# Patient Record
Sex: Male | Born: 1940 | Race: White | Hispanic: No | Marital: Married | Smoking: Former smoker
Health system: Southern US, Community
[De-identification: ages and names within clinical notes are randomized; demographics above are authoritative.]

## PROBLEM LIST (undated history)

## (undated) DIAGNOSIS — I779 Disorder of arteries and arterioles, unspecified: Secondary | ICD-10-CM

## (undated) DIAGNOSIS — R001 Bradycardia, unspecified: Secondary | ICD-10-CM

## (undated) DIAGNOSIS — R059 Cough, unspecified: Secondary | ICD-10-CM

## (undated) DIAGNOSIS — I2510 Atherosclerotic heart disease of native coronary artery without angina pectoris: Secondary | ICD-10-CM

## (undated) DIAGNOSIS — E7800 Pure hypercholesterolemia, unspecified: Secondary | ICD-10-CM

## (undated) DIAGNOSIS — R251 Tremor, unspecified: Secondary | ICD-10-CM

## (undated) DIAGNOSIS — I739 Peripheral vascular disease, unspecified: Secondary | ICD-10-CM

## (undated) DIAGNOSIS — Z86018 Personal history of other benign neoplasm: Secondary | ICD-10-CM

## (undated) DIAGNOSIS — D649 Anemia, unspecified: Secondary | ICD-10-CM

## (undated) DIAGNOSIS — K219 Gastro-esophageal reflux disease without esophagitis: Secondary | ICD-10-CM

## (undated) DIAGNOSIS — I471 Supraventricular tachycardia, unspecified: Secondary | ICD-10-CM

## (undated) DIAGNOSIS — G4733 Obstructive sleep apnea (adult) (pediatric): Secondary | ICD-10-CM

## (undated) DIAGNOSIS — I340 Nonrheumatic mitral (valve) insufficiency: Secondary | ICD-10-CM

## (undated) DIAGNOSIS — R55 Syncope and collapse: Secondary | ICD-10-CM

## (undated) DIAGNOSIS — D66 Hereditary factor VIII deficiency: Secondary | ICD-10-CM

## (undated) DIAGNOSIS — R9430 Abnormal result of cardiovascular function study, unspecified: Secondary | ICD-10-CM

## (undated) DIAGNOSIS — R05 Cough: Secondary | ICD-10-CM

## (undated) DIAGNOSIS — IMO0002 Reserved for concepts with insufficient information to code with codable children: Secondary | ICD-10-CM

## (undated) DIAGNOSIS — I10 Essential (primary) hypertension: Secondary | ICD-10-CM

## (undated) DIAGNOSIS — Z7901 Long term (current) use of anticoagulants: Secondary | ICD-10-CM

## (undated) DIAGNOSIS — K5090 Crohn's disease, unspecified, without complications: Secondary | ICD-10-CM

## (undated) DIAGNOSIS — R269 Unspecified abnormalities of gait and mobility: Secondary | ICD-10-CM

## (undated) DIAGNOSIS — Z79899 Other long term (current) drug therapy: Secondary | ICD-10-CM

## (undated) DIAGNOSIS — I951 Orthostatic hypotension: Secondary | ICD-10-CM

## (undated) DIAGNOSIS — F419 Anxiety disorder, unspecified: Secondary | ICD-10-CM

## (undated) DIAGNOSIS — I4510 Unspecified right bundle-branch block: Secondary | ICD-10-CM

## (undated) DIAGNOSIS — J986 Disorders of diaphragm: Secondary | ICD-10-CM

## (undated) DIAGNOSIS — I2699 Other pulmonary embolism without acute cor pulmonale: Secondary | ICD-10-CM

## (undated) DIAGNOSIS — I480 Paroxysmal atrial fibrillation: Secondary | ICD-10-CM

## (undated) HISTORY — DX: Supraventricular tachycardia, unspecified: I47.10

## (undated) HISTORY — DX: Essential (primary) hypertension: I10

## (undated) HISTORY — DX: Hereditary factor VIII deficiency: D66

## (undated) HISTORY — PX: CHOLECYSTECTOMY: SHX55

## (undated) HISTORY — PX: BACK SURGERY: SHX140

## (undated) HISTORY — DX: Disorders of diaphragm: J98.6

## (undated) HISTORY — DX: Cough: R05

## (undated) HISTORY — DX: Unspecified right bundle-branch block: I45.10

## (undated) HISTORY — DX: Atherosclerotic heart disease of native coronary artery without angina pectoris: I25.10

## (undated) HISTORY — PX: CARDIAC CATHETERIZATION: SHX172

## (undated) HISTORY — DX: Anemia, unspecified: D64.9

## (undated) HISTORY — DX: Crohn's disease, unspecified, without complications: K50.90

## (undated) HISTORY — DX: Nonrheumatic mitral (valve) insufficiency: I34.0

## (undated) HISTORY — PX: COLON SURGERY: SHX602

## (undated) HISTORY — DX: Other pulmonary embolism without acute cor pulmonale: I26.99

## (undated) HISTORY — PX: COLONOSCOPY W/ POLYPECTOMY: SHX1380

## (undated) HISTORY — DX: Long term (current) use of anticoagulants: Z79.01

## (undated) HISTORY — DX: Anxiety disorder, unspecified: F41.9

## (undated) HISTORY — DX: Syncope and collapse: R55

## (undated) HISTORY — DX: Tremor, unspecified: R25.1

## (undated) HISTORY — DX: Orthostatic hypotension: I95.1

## (undated) HISTORY — DX: Bradycardia, unspecified: R00.1

## (undated) HISTORY — DX: Personal history of other benign neoplasm: Z86.018

## (undated) HISTORY — PX: LUMBAR DISC SURGERY: SHX700

## (undated) HISTORY — DX: Peripheral vascular disease, unspecified: I73.9

## (undated) HISTORY — DX: Supraventricular tachycardia: I47.1

## (undated) HISTORY — DX: Abnormal result of cardiovascular function study, unspecified: R94.30

## (undated) HISTORY — DX: Reserved for concepts with insufficient information to code with codable children: IMO0002

## (undated) HISTORY — DX: Other long term (current) drug therapy: Z79.899

## (undated) HISTORY — DX: Disorder of arteries and arterioles, unspecified: I77.9

## (undated) HISTORY — DX: Unspecified abnormalities of gait and mobility: R26.9

## (undated) HISTORY — DX: Cough, unspecified: R05.9

## (undated) HISTORY — PX: TONSILLECTOMY: SUR1361

---

## 1989-04-12 HISTORY — PX: CATARACT EXTRACTION W/ INTRAOCULAR LENS  IMPLANT, BILATERAL: SHX1307

## 2004-07-03 ENCOUNTER — Ambulatory Visit: Payer: Self-pay

## 2004-07-10 ENCOUNTER — Ambulatory Visit: Payer: Self-pay

## 2005-03-20 ENCOUNTER — Ambulatory Visit: Payer: Self-pay | Admitting: Unknown Physician Specialty

## 2005-03-31 ENCOUNTER — Inpatient Hospital Stay: Payer: Self-pay | Admitting: Unknown Physician Specialty

## 2005-03-31 ENCOUNTER — Other Ambulatory Visit: Payer: Self-pay

## 2005-04-21 ENCOUNTER — Other Ambulatory Visit: Payer: Self-pay

## 2005-04-21 ENCOUNTER — Inpatient Hospital Stay: Payer: Self-pay | Admitting: Internal Medicine

## 2005-06-07 ENCOUNTER — Other Ambulatory Visit: Payer: Self-pay

## 2005-06-07 ENCOUNTER — Inpatient Hospital Stay: Payer: Self-pay | Admitting: Unknown Physician Specialty

## 2005-06-08 ENCOUNTER — Other Ambulatory Visit: Payer: Self-pay

## 2005-07-11 ENCOUNTER — Ambulatory Visit: Payer: Self-pay | Admitting: Cardiology

## 2005-07-19 ENCOUNTER — Ambulatory Visit: Payer: Self-pay

## 2005-07-29 ENCOUNTER — Ambulatory Visit: Payer: Self-pay | Admitting: Cardiology

## 2005-08-12 HISTORY — PX: VENA CAVA FILTER PLACEMENT: SUR1032

## 2005-10-22 ENCOUNTER — Ambulatory Visit: Payer: Self-pay | Admitting: Cardiology

## 2005-11-05 ENCOUNTER — Ambulatory Visit: Payer: Self-pay | Admitting: Unknown Physician Specialty

## 2005-11-14 ENCOUNTER — Inpatient Hospital Stay: Payer: Self-pay | Admitting: Internal Medicine

## 2005-12-04 ENCOUNTER — Ambulatory Visit: Payer: Self-pay | Admitting: Cardiology

## 2005-12-09 ENCOUNTER — Ambulatory Visit: Payer: Self-pay | Admitting: Internal Medicine

## 2006-01-06 ENCOUNTER — Ambulatory Visit: Payer: Self-pay | Admitting: Internal Medicine

## 2006-01-10 ENCOUNTER — Ambulatory Visit: Payer: Self-pay | Admitting: Internal Medicine

## 2006-02-25 ENCOUNTER — Ambulatory Visit: Payer: Self-pay | Admitting: Ophthalmology

## 2006-03-03 ENCOUNTER — Ambulatory Visit: Payer: Self-pay | Admitting: Ophthalmology

## 2006-03-07 ENCOUNTER — Ambulatory Visit: Payer: Self-pay | Admitting: Internal Medicine

## 2006-05-12 ENCOUNTER — Ambulatory Visit: Payer: Self-pay | Admitting: Cardiology

## 2006-06-09 ENCOUNTER — Ambulatory Visit: Payer: Self-pay | Admitting: Unknown Physician Specialty

## 2006-09-22 ENCOUNTER — Ambulatory Visit: Payer: Self-pay | Admitting: Cardiology

## 2006-10-20 ENCOUNTER — Ambulatory Visit: Payer: Self-pay | Admitting: Cardiology

## 2006-10-20 LAB — CONVERTED CEMR LAB: TSH: 1.61 microintl units/mL (ref 0.35–5.50)

## 2006-11-11 HISTORY — PX: CORONARY ARTERY BYPASS GRAFT: SHX141

## 2006-11-12 ENCOUNTER — Ambulatory Visit: Payer: Self-pay | Admitting: Psychiatry

## 2006-11-13 ENCOUNTER — Ambulatory Visit: Payer: Self-pay | Admitting: Cardiology

## 2006-11-13 ENCOUNTER — Ambulatory Visit: Payer: Self-pay | Admitting: Psychiatry

## 2006-11-13 LAB — CONVERTED CEMR LAB
ALT: 26 units/L (ref 0–40)
AST: 27 units/L (ref 0–37)
Albumin: 3.9 g/dL (ref 3.5–5.2)
Alkaline Phosphatase: 68 units/L (ref 39–117)
Basophils Absolute: 0.1 10*3/uL (ref 0.0–0.1)
Calcium: 9.3 mg/dL (ref 8.4–10.5)
Chloride: 106 meq/L (ref 96–112)
Creatinine, Ser: 1 mg/dL (ref 0.4–1.5)
GFR calc non Af Amer: 79 mL/min
HCT: 41.5 % (ref 39.0–52.0)
MCHC: 33.3 g/dL (ref 30.0–36.0)
Neutrophils Relative %: 48 % (ref 43.0–77.0)
Platelets: 306 10*3/uL (ref 150–400)
RBC: 4.92 M/uL (ref 4.22–5.81)
RDW: 17.2 % — ABNORMAL HIGH (ref 11.5–14.6)
Sodium: 142 meq/L (ref 135–145)
Total Bilirubin: 0.6 mg/dL (ref 0.3–1.2)
WBC: 10.9 10*3/uL — ABNORMAL HIGH (ref 4.5–10.5)

## 2006-11-19 ENCOUNTER — Ambulatory Visit: Payer: Self-pay | Admitting: Cardiology

## 2006-11-24 ENCOUNTER — Ambulatory Visit: Payer: Self-pay | Admitting: Cardiology

## 2006-11-24 ENCOUNTER — Ambulatory Visit (HOSPITAL_COMMUNITY): Admission: RE | Admit: 2006-11-24 | Discharge: 2006-11-24 | Payer: Self-pay | Admitting: Cardiology

## 2006-11-25 ENCOUNTER — Ambulatory Visit: Payer: Self-pay | Admitting: Cardiology

## 2006-11-25 ENCOUNTER — Encounter: Payer: Self-pay | Admitting: Cardiovascular Disease

## 2006-11-25 ENCOUNTER — Ambulatory Visit: Payer: Self-pay

## 2006-11-25 LAB — CONVERTED CEMR LAB
BUN: 13 mg/dL (ref 6–23)
CO2: 29 meq/L (ref 19–32)
Creatinine, Ser: 1.1 mg/dL (ref 0.4–1.5)
GFR calc Af Amer: 86 mL/min
Glucose, Bld: 90 mg/dL (ref 70–99)
Potassium: 4.3 meq/L (ref 3.5–5.1)
Sodium: 141 meq/L (ref 135–145)

## 2006-11-26 ENCOUNTER — Ambulatory Visit: Payer: Self-pay | Admitting: Cardiology

## 2006-11-26 LAB — CONVERTED CEMR LAB
BUN: 12 mg/dL (ref 6–23)
Basophils Relative: 0.3 % (ref 0.0–1.0)
CO2: 28 meq/L (ref 19–32)
Calcium: 9.9 mg/dL (ref 8.4–10.5)
Eosinophils Absolute: 0 10*3/uL (ref 0.0–0.6)
GFR calc Af Amer: 96 mL/min
GFR calc non Af Amer: 79 mL/min
Hemoglobin: 14.7 g/dL (ref 13.0–17.0)
Lymphocytes Relative: 19.7 % (ref 12.0–46.0)
MCHC: 33.2 g/dL (ref 30.0–36.0)
MCV: 83.9 fL (ref 78.0–100.0)
Monocytes Absolute: 0.1 10*3/uL — ABNORMAL LOW (ref 0.2–0.7)
Monocytes Relative: 1 % — ABNORMAL LOW (ref 3.0–11.0)
Neutro Abs: 5.1 10*3/uL (ref 1.4–7.7)
Platelets: 301 10*3/uL (ref 150–400)
Potassium: 4.4 meq/L (ref 3.5–5.1)

## 2006-12-01 ENCOUNTER — Inpatient Hospital Stay (HOSPITAL_BASED_OUTPATIENT_CLINIC_OR_DEPARTMENT_OTHER): Admission: RE | Admit: 2006-12-01 | Discharge: 2006-12-01 | Payer: Self-pay | Admitting: Cardiovascular Disease

## 2006-12-01 ENCOUNTER — Ambulatory Visit: Payer: Self-pay | Admitting: Cardiology

## 2006-12-01 ENCOUNTER — Ambulatory Visit: Payer: Self-pay | Admitting: Cardiovascular Disease

## 2006-12-03 ENCOUNTER — Ambulatory Visit: Payer: Self-pay | Admitting: Thoracic Surgery (Cardiothoracic Vascular Surgery)

## 2006-12-05 ENCOUNTER — Ambulatory Visit: Payer: Self-pay | Admitting: Cardiology

## 2006-12-10 ENCOUNTER — Ambulatory Visit: Payer: Self-pay | Admitting: Internal Medicine

## 2006-12-10 ENCOUNTER — Ambulatory Visit: Payer: Self-pay | Admitting: Thoracic Surgery (Cardiothoracic Vascular Surgery)

## 2006-12-10 ENCOUNTER — Inpatient Hospital Stay (HOSPITAL_COMMUNITY)
Admission: RE | Admit: 2006-12-10 | Discharge: 2006-12-17 | Payer: Self-pay | Admitting: Thoracic Surgery (Cardiothoracic Vascular Surgery)

## 2006-12-24 ENCOUNTER — Ambulatory Visit: Payer: Self-pay | Admitting: Cardiology

## 2006-12-24 ENCOUNTER — Ambulatory Visit: Payer: Self-pay | Admitting: *Deleted

## 2006-12-24 LAB — CONVERTED CEMR LAB
INR: 3.2 — ABNORMAL HIGH (ref 0.9–2.0)
Prothrombin Time: 22.6 s — ABNORMAL HIGH (ref 10.0–14.0)

## 2007-01-01 ENCOUNTER — Ambulatory Visit: Payer: Self-pay | Admitting: Cardiology

## 2007-01-09 ENCOUNTER — Ambulatory Visit: Payer: Self-pay | Admitting: Cardiology

## 2007-01-12 ENCOUNTER — Ambulatory Visit: Payer: Self-pay | Admitting: Thoracic Surgery (Cardiothoracic Vascular Surgery)

## 2007-01-23 ENCOUNTER — Ambulatory Visit: Payer: Self-pay | Admitting: Cardiovascular Disease

## 2007-01-28 ENCOUNTER — Ambulatory Visit: Payer: Self-pay | Admitting: Cardiology

## 2007-02-11 ENCOUNTER — Ambulatory Visit: Payer: Self-pay | Admitting: Cardiology

## 2007-02-11 ENCOUNTER — Encounter: Payer: Self-pay | Admitting: Cardiology

## 2007-02-25 ENCOUNTER — Ambulatory Visit: Payer: Self-pay | Admitting: Internal Medicine

## 2007-03-13 ENCOUNTER — Encounter: Payer: Self-pay | Admitting: Cardiology

## 2007-03-13 ENCOUNTER — Ambulatory Visit: Payer: Self-pay | Admitting: Internal Medicine

## 2007-04-02 ENCOUNTER — Ambulatory Visit: Payer: Self-pay | Admitting: Cardiology

## 2007-04-13 ENCOUNTER — Encounter: Payer: Self-pay | Admitting: Cardiology

## 2007-04-16 ENCOUNTER — Ambulatory Visit: Payer: Self-pay | Admitting: Cardiology

## 2007-04-28 ENCOUNTER — Ambulatory Visit: Payer: Self-pay | Admitting: Unknown Physician Specialty

## 2007-05-01 ENCOUNTER — Ambulatory Visit: Payer: Self-pay | Admitting: Cardiovascular Disease

## 2007-05-01 ENCOUNTER — Ambulatory Visit: Payer: Self-pay | Admitting: Cardiology

## 2007-05-08 ENCOUNTER — Ambulatory Visit: Payer: Self-pay | Admitting: Cardiovascular Disease

## 2007-05-13 ENCOUNTER — Encounter: Payer: Self-pay | Admitting: Cardiology

## 2007-05-29 ENCOUNTER — Ambulatory Visit: Payer: Self-pay | Admitting: Cardiology

## 2007-06-26 ENCOUNTER — Ambulatory Visit: Payer: Self-pay | Admitting: Cardiology

## 2007-07-17 ENCOUNTER — Ambulatory Visit: Payer: Self-pay | Admitting: Cardiology

## 2007-08-03 ENCOUNTER — Ambulatory Visit: Payer: Self-pay | Admitting: Cardiology

## 2007-08-28 ENCOUNTER — Ambulatory Visit: Payer: Self-pay | Admitting: Cardiology

## 2007-09-23 ENCOUNTER — Ambulatory Visit: Payer: Self-pay | Admitting: Cardiology

## 2007-10-06 ENCOUNTER — Ambulatory Visit: Payer: Self-pay | Admitting: Cardiology

## 2007-10-29 ENCOUNTER — Ambulatory Visit: Payer: Self-pay | Admitting: Cardiology

## 2007-11-13 ENCOUNTER — Ambulatory Visit: Payer: Self-pay | Admitting: Cardiology

## 2007-11-13 ENCOUNTER — Encounter: Payer: Self-pay | Admitting: Cardiology

## 2007-11-13 ENCOUNTER — Ambulatory Visit: Payer: Self-pay

## 2007-11-19 ENCOUNTER — Ambulatory Visit: Payer: Self-pay | Admitting: Cardiology

## 2007-11-19 ENCOUNTER — Ambulatory Visit: Payer: Self-pay

## 2007-11-26 ENCOUNTER — Ambulatory Visit: Payer: Self-pay

## 2007-12-04 ENCOUNTER — Ambulatory Visit: Payer: Self-pay | Admitting: Internal Medicine

## 2007-12-08 ENCOUNTER — Ambulatory Visit: Payer: Self-pay | Admitting: Cardiology

## 2007-12-09 ENCOUNTER — Ambulatory Visit: Payer: Self-pay | Admitting: Internal Medicine

## 2007-12-14 ENCOUNTER — Ambulatory Visit: Payer: Self-pay | Admitting: Internal Medicine

## 2007-12-25 ENCOUNTER — Ambulatory Visit: Payer: Self-pay | Admitting: Cardiology

## 2007-12-30 ENCOUNTER — Ambulatory Visit: Payer: Self-pay | Admitting: Cardiology

## 2008-01-14 ENCOUNTER — Ambulatory Visit: Payer: Self-pay | Admitting: Internal Medicine

## 2008-01-29 ENCOUNTER — Ambulatory Visit: Payer: Self-pay | Admitting: Internal Medicine

## 2008-02-08 ENCOUNTER — Ambulatory Visit: Payer: Self-pay | Admitting: Cardiology

## 2008-02-22 ENCOUNTER — Ambulatory Visit: Payer: Self-pay | Admitting: Cardiology

## 2008-03-14 ENCOUNTER — Ambulatory Visit: Payer: Self-pay | Admitting: Cardiology

## 2008-04-04 ENCOUNTER — Ambulatory Visit: Payer: Self-pay | Admitting: Internal Medicine

## 2008-04-11 ENCOUNTER — Ambulatory Visit: Payer: Self-pay | Admitting: Cardiology

## 2008-04-25 ENCOUNTER — Ambulatory Visit: Payer: Self-pay | Admitting: Cardiology

## 2008-05-04 ENCOUNTER — Ambulatory Visit: Payer: Self-pay | Admitting: Cardiology

## 2008-05-12 ENCOUNTER — Ambulatory Visit: Payer: Self-pay | Admitting: Internal Medicine

## 2008-06-03 ENCOUNTER — Ambulatory Visit: Payer: Self-pay | Admitting: Cardiology

## 2008-06-16 ENCOUNTER — Ambulatory Visit: Payer: Self-pay | Admitting: Cardiology

## 2008-07-01 ENCOUNTER — Ambulatory Visit: Payer: Self-pay | Admitting: Cardiovascular Disease

## 2008-07-22 ENCOUNTER — Ambulatory Visit: Payer: Self-pay | Admitting: Cardiovascular Disease

## 2008-07-27 ENCOUNTER — Ambulatory Visit: Payer: Self-pay | Admitting: Cardiology

## 2008-08-09 ENCOUNTER — Ambulatory Visit: Payer: Self-pay | Admitting: Cardiovascular Disease

## 2008-09-06 ENCOUNTER — Ambulatory Visit: Payer: Self-pay | Admitting: Cardiology

## 2008-09-30 ENCOUNTER — Ambulatory Visit: Payer: Self-pay | Admitting: Cardiovascular Disease

## 2008-10-21 ENCOUNTER — Ambulatory Visit: Payer: Self-pay | Admitting: Cardiovascular Disease

## 2008-11-03 ENCOUNTER — Encounter: Payer: Self-pay | Admitting: Cardiology

## 2008-11-03 ENCOUNTER — Ambulatory Visit: Payer: Self-pay | Admitting: Cardiology

## 2008-11-10 ENCOUNTER — Ambulatory Visit: Payer: Self-pay | Admitting: Cardiovascular Disease

## 2008-12-02 ENCOUNTER — Ambulatory Visit: Payer: Self-pay | Admitting: Cardiology

## 2008-12-23 ENCOUNTER — Ambulatory Visit: Payer: Self-pay | Admitting: Cardiology

## 2009-01-09 ENCOUNTER — Ambulatory Visit: Payer: Self-pay | Admitting: Cardiovascular Disease

## 2009-01-09 ENCOUNTER — Observation Stay: Payer: Self-pay | Admitting: Internal Medicine

## 2009-01-10 ENCOUNTER — Telehealth: Payer: Self-pay | Admitting: Cardiology

## 2009-01-10 ENCOUNTER — Encounter: Payer: Self-pay | Admitting: Cardiovascular Disease

## 2009-01-10 ENCOUNTER — Encounter: Payer: Self-pay | Admitting: *Deleted

## 2009-01-10 ENCOUNTER — Encounter: Payer: Self-pay | Admitting: Cardiology

## 2009-01-13 ENCOUNTER — Ambulatory Visit: Payer: Self-pay | Admitting: Cardiology

## 2009-01-19 ENCOUNTER — Encounter: Payer: Self-pay | Admitting: Cardiology

## 2009-01-25 ENCOUNTER — Encounter: Payer: Self-pay | Admitting: Cardiology

## 2009-01-25 DIAGNOSIS — K5090 Crohn's disease, unspecified, without complications: Secondary | ICD-10-CM

## 2009-01-27 ENCOUNTER — Ambulatory Visit: Payer: Self-pay | Admitting: Cardiology

## 2009-02-06 ENCOUNTER — Ambulatory Visit: Payer: Self-pay | Admitting: Cardiology

## 2009-02-06 LAB — CONVERTED CEMR LAB
POC INR: 2.7
Prothrombin Time: 19.9 s

## 2009-02-08 ENCOUNTER — Encounter (INDEPENDENT_AMBULATORY_CARE_PROVIDER_SITE_OTHER): Payer: Self-pay | Admitting: *Deleted

## 2009-02-08 ENCOUNTER — Ambulatory Visit: Payer: Self-pay | Admitting: Cardiology

## 2009-02-08 LAB — CONVERTED CEMR LAB
Basophils Relative: 2.9 % (ref 0.0–3.0)
CO2: 30 meq/L (ref 19–32)
Chloride: 103 meq/L (ref 96–112)
Creatinine, Ser: 0.9 mg/dL (ref 0.4–1.5)
Eosinophils Absolute: 0.4 10*3/uL (ref 0.0–0.7)
Eosinophils Relative: 4.7 % (ref 0.0–5.0)
HCT: 42.8 % (ref 39.0–52.0)
Lymphs Abs: 3.7 10*3/uL (ref 0.7–4.0)
MCHC: 34.1 g/dL (ref 30.0–36.0)
MCV: 92 fL (ref 78.0–100.0)
Monocytes Absolute: 1.1 10*3/uL — ABNORMAL HIGH (ref 0.1–1.0)
Neutro Abs: 2.9 10*3/uL (ref 1.4–7.7)
Potassium: 4.3 meq/L (ref 3.5–5.1)
RBC: 4.65 M/uL (ref 4.22–5.81)
WBC: 8.3 10*3/uL (ref 4.5–10.5)

## 2009-02-09 ENCOUNTER — Ambulatory Visit: Payer: Self-pay | Admitting: Cardiovascular Disease

## 2009-02-09 ENCOUNTER — Inpatient Hospital Stay (HOSPITAL_BASED_OUTPATIENT_CLINIC_OR_DEPARTMENT_OTHER): Admission: RE | Admit: 2009-02-09 | Discharge: 2009-02-09 | Payer: Self-pay | Admitting: Cardiovascular Disease

## 2009-02-15 ENCOUNTER — Encounter: Payer: Self-pay | Admitting: *Deleted

## 2009-02-17 ENCOUNTER — Ambulatory Visit: Payer: Self-pay | Admitting: Cardiology

## 2009-02-17 LAB — CONVERTED CEMR LAB: Prothrombin Time: 16.1 s

## 2009-02-22 ENCOUNTER — Ambulatory Visit: Payer: Self-pay | Admitting: Internal Medicine

## 2009-03-06 ENCOUNTER — Ambulatory Visit: Payer: Self-pay | Admitting: Cardiology

## 2009-03-06 LAB — CONVERTED CEMR LAB
POC INR: 2.3
Prothrombin Time: 18.4 s

## 2009-04-03 ENCOUNTER — Ambulatory Visit: Payer: Self-pay | Admitting: Cardiovascular Disease

## 2009-04-03 LAB — CONVERTED CEMR LAB: POC INR: 2.1

## 2009-04-11 ENCOUNTER — Encounter: Payer: Self-pay | Admitting: Cardiology

## 2009-04-12 ENCOUNTER — Ambulatory Visit: Payer: Self-pay | Admitting: Cardiology

## 2009-04-27 ENCOUNTER — Encounter: Payer: Self-pay | Admitting: Cardiology

## 2009-05-05 ENCOUNTER — Ambulatory Visit: Payer: Self-pay | Admitting: Internal Medicine

## 2009-05-05 LAB — CONVERTED CEMR LAB: POC INR: 1.9

## 2009-06-02 ENCOUNTER — Ambulatory Visit: Payer: Self-pay | Admitting: Cardiology

## 2009-06-02 LAB — CONVERTED CEMR LAB: POC INR: 2.1

## 2009-06-30 ENCOUNTER — Ambulatory Visit: Payer: Self-pay | Admitting: Cardiology

## 2009-07-28 ENCOUNTER — Ambulatory Visit: Payer: Self-pay | Admitting: Cardiovascular Disease

## 2009-08-25 ENCOUNTER — Ambulatory Visit: Payer: Self-pay | Admitting: Internal Medicine

## 2009-08-25 LAB — CONVERTED CEMR LAB: POC INR: 2

## 2009-09-08 ENCOUNTER — Encounter (INDEPENDENT_AMBULATORY_CARE_PROVIDER_SITE_OTHER): Payer: Self-pay | Admitting: *Deleted

## 2009-09-22 ENCOUNTER — Ambulatory Visit: Payer: Self-pay | Admitting: Internal Medicine

## 2009-09-22 ENCOUNTER — Encounter (INDEPENDENT_AMBULATORY_CARE_PROVIDER_SITE_OTHER): Payer: Self-pay | Admitting: Cardiology

## 2009-10-24 ENCOUNTER — Ambulatory Visit: Payer: Self-pay | Admitting: Cardiology

## 2009-10-24 ENCOUNTER — Encounter: Payer: Self-pay | Admitting: Cardiology

## 2009-10-26 ENCOUNTER — Encounter: Payer: Self-pay | Admitting: Cardiology

## 2009-11-10 ENCOUNTER — Ambulatory Visit: Payer: Self-pay | Admitting: Internal Medicine

## 2009-11-10 LAB — CONVERTED CEMR LAB: POC INR: 2.2

## 2009-12-04 ENCOUNTER — Ambulatory Visit: Payer: Self-pay | Admitting: Cardiovascular Disease

## 2009-12-04 LAB — CONVERTED CEMR LAB: POC INR: 2

## 2010-01-05 ENCOUNTER — Ambulatory Visit: Payer: Self-pay | Admitting: Cardiology

## 2010-01-05 LAB — CONVERTED CEMR LAB: POC INR: 2.2

## 2010-02-02 ENCOUNTER — Ambulatory Visit: Payer: Self-pay | Admitting: Cardiology

## 2010-02-02 LAB — CONVERTED CEMR LAB: POC INR: 2.3

## 2010-03-02 ENCOUNTER — Ambulatory Visit: Payer: Self-pay | Admitting: Cardiology

## 2010-03-02 LAB — CONVERTED CEMR LAB: POC INR: 2.2

## 2010-03-16 ENCOUNTER — Ambulatory Visit: Payer: Self-pay | Admitting: Cardiovascular Disease

## 2010-03-20 ENCOUNTER — Ambulatory Visit: Payer: Self-pay | Admitting: Unknown Physician Specialty

## 2010-03-23 ENCOUNTER — Ambulatory Visit: Payer: Self-pay | Admitting: Internal Medicine

## 2010-03-26 ENCOUNTER — Ambulatory Visit: Payer: Self-pay | Admitting: Cardiology

## 2010-03-28 ENCOUNTER — Ambulatory Visit: Payer: Self-pay | Admitting: Cardiology

## 2010-03-28 LAB — CONVERTED CEMR LAB: POC INR: 2

## 2010-04-11 ENCOUNTER — Ambulatory Visit: Payer: Self-pay | Admitting: Cardiology

## 2010-04-11 LAB — CONVERTED CEMR LAB: POC INR: 1.5

## 2010-04-25 ENCOUNTER — Ambulatory Visit: Payer: Self-pay | Admitting: Cardiovascular Disease

## 2010-05-09 ENCOUNTER — Ambulatory Visit: Payer: Self-pay | Admitting: Cardiology

## 2010-05-09 LAB — CONVERTED CEMR LAB: POC INR: 2.3

## 2010-05-30 ENCOUNTER — Ambulatory Visit: Payer: Self-pay | Admitting: Internal Medicine

## 2010-05-30 LAB — CONVERTED CEMR LAB: POC INR: 1.8

## 2010-06-20 ENCOUNTER — Ambulatory Visit: Payer: Self-pay | Admitting: Cardiology

## 2010-06-20 LAB — CONVERTED CEMR LAB: POC INR: 2.7

## 2010-07-18 ENCOUNTER — Ambulatory Visit: Payer: Self-pay | Admitting: Cardiovascular Disease

## 2010-08-15 ENCOUNTER — Ambulatory Visit: Admission: RE | Admit: 2010-08-15 | Discharge: 2010-08-15 | Payer: Self-pay | Source: Home / Self Care

## 2010-08-15 LAB — CONVERTED CEMR LAB: POC INR: 2.4

## 2010-09-12 NOTE — Assessment & Plan Note (Signed)
Summary: f53m/happy birthday/saf   Visit Type:  Follow-up Referring Provider:  Julieanne Manson [name] Primary Provider:  Julieanne Manson [name]   History of Present Illness: Patient is seen for followup of coronary disease.  He is doing very well.  He had one slight episode of indigestion.  I doubt that this was cardiac.  I saw him last September, 2010.  He has good LV function by echo in April 2 009.  He had relook cardiac catheterization and his grafts were all patent in July, 2010.  His CABG was done in 2008.  Current Medications (verified): 1)  Warfarin Sodium 5 Mg Tabs (Warfarin Sodium) .... Take As Directed By Coumadin Clinic. 2)  Centrum Silver  Tabs (Multiple Vitamins-Minerals) .... Once Daily 3)  Fish Oil   Oil (Fish Oil) .... 2 Once Daily 4)  Calcium Carbonate-Vitamin D 600-400 Mg-Unit  Tabs (Calcium Carbonate-Vitamin D) .... Once Daily 5)  Simvastatin 40 Mg Tabs (Simvastatin) .... Take One Tablet By Mouth Daily At Bedtime 6)  Omeprazole 20 Mg Cpdr (Omeprazole) .... Once Daily 7)  Metoprolol Succinate 25 Mg Xr24h-Tab (Metoprolol Succinate) .... Take One Tablet By Mouth Daily 8)  Folic Acid   1mg  .... 1 Once Daily 9)  Aspirin 81 Mg  Tabs (Aspirin) .... Take One Tablet By Mouth Once Daily. 10)  Vitamin B-12 Cr 1000 Mcg Cr-Tabs (Cyanocobalamin) .... Q Month 11)  Vitamin D 1000 Unit  Tabs (Cholecalciferol) .... Once Daily 12)  Nitro-Dur 0.4 Mg/hr Pt24 (Nitroglycerin) .... Prn 13)  Clonazepam 0.5 Mg Tabs (Clonazepam) .... Prn 14)  Docusate Sodium 100 Mg Tabs (Docusate Sodium) .... Prn 15)  Claritin 10 Mg Tabs (Loratadine) .... 2 Hrs Before Humira 16)  Zantab 150mg  .... 2 Hrs Before Humira 17)  Dramamine 50 Mg Tabs (Dimenhydrinate) .... Prn 18)  Humira Pen 40 Mg/0.49ml Kit (Adalimumab) .... Uad 19)  Prednisone 20 Mg Tabs (Prednisone) .... Uad 20)  Fluticasone Propionate 50 Mcg/act Susp (Fluticasone Propionate) .... Uad  Allergies (verified): 1)  ! Diltiazem Hcl Er Beads 2)  !  Amiodarone Hcl 3)  ! * Purinethol 4)  ! * Metronidazole 5)  ! * Remicade 6)  ! * Asacol 7)  ! Codeine 8)  ! * Pyridostigmine 9)  ! Methotrexate  Past History:  Past Medical History: Last updated: 10/24/2009  History of syncope. Evaluation by Dr. Graciela Husbands: Syncope probably neurally mediated and modest resting sinus bradycardia. Monitor her revealed sinus bradycardia and 16 B. episode of supraventricular tachycardia that was either atrial fibrillation or another type of SVT. Patient improved with combination of holding ACE inhibitor or low blood pressure and reducing beta blocker for bradycardia. Crohn's disease treated history of orthostasis History supraventricular tachycardia  Hypertension Anemia related to Crohn's disease Intermittent steroid use as needed Status post cholecystectomy History of colon tumor Pulmonary embolus 2007 documented by CT. IVC filter placed because of concern for Coumadin use at that time. History of IVC filter Coumadin therapy Question sleep apnea EF  55-60%  ...echo... April, 2009 Mitral regurgitation.... mild.... echo.... 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 This is additional reason for Coumadin. CAD... relook catheter July 1,2010= all 5 grafts are patent. 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  Review of Systems  Patient denies fever, chills, headache, sweats, rash, change in vision, change in hearing, chest pain, cough, shortness of breath.  He denies any urinary symptoms.  All of the systems are reviewed and are negative.  Vital Signs:  Patient profile:   70 year old male Height:      71 inches Weight:      205 pounds BMI:      28.70 Pulse rate:   60 / minute BP sitting:   142 / 80  (left arm) Cuff size:   regular  Vitals Entered By: Hardin Negus, RMA (October 24, 2009 11:49 AM)  Physical Exam  General:  patient is stable today. Eyes:  he has normal extraocular motion. Neck:  no jugular venous distention. Lungs:  lungs are clear.  Respiratory effort is nonlabored. Heart:  cardiac exam reveals S1 and S2.  No clicks.  There is a soft systolic murmur. Abdomen:  abdomen is soft. Extremities:  no peripheral edema. Psych:  patient is oriented to person time and place.  Affect is normal.  Here with his wife today.   Impression & Recommendations:  Problem # 1:  * COUMADIN RX patient continues on Coumadin.  He is stable.  Problem # 2:  PULMONARY EMBOLISM (ICD-415.19) The patient has had no signs of any recurrent pulmonary emboli.  He continues on Coumadin.  Problem # 3:  HYPERTENSION, BENIGN (ICD-401.1) Blood pressure is under good control.  No change in therapy.  Problem # 4:  SYNCOPE (ICD-780.2) The patient has had no recurrent signs of syncope or presyncope.  He is keeping himself hydrated and is stable.  No further workup.  Patient Instructions: 1)  Follow up in 1 year

## 2010-09-12 NOTE — Medication Information (Signed)
Summary: rov/tm  Anticoagulant Therapy  Managed by: Shelby Dubin, PharmD, BCPS, CPP Referring MD: Willa Rough MD PCP: Julieanne Manson [name] Supervising MD: Tenny Craw MD, Gunnar Fusi Indication 1: Atrial Fibrillation (ICD-427.31) Indication 2: Pulmonary Embolism and Infarction (ICD-415.1) Lab Used: [facility] Site: [facility] INR POC 1.9 INR RANGE 2 - 3  Dietary changes: no    Health status changes: no    Bleeding/hemorrhagic complications: no    Recent/future hospitalizations: no    Any changes in medication regimen? no    Recent/future dental: no  Any missed doses?: no       Is patient compliant with meds? yes       Allergies (verified): 1)  ! Diltiazem Hcl Er Beads 2)  ! Amiodarone Hcl 3)  ! * Purinethol 4)  ! * Metronidazole 5)  ! * Remicade 6)  ! * Asacol 7)  ! Codeine 8)  ! * Pyridostigmine 9)  ! Methotrexate  Anticoagulation Management History:      The patient is taking warfarin and comes in today for a routine follow up visit.  Positive risk factors for bleeding include an age of 71 years or older.  The bleeding index is 'intermediate risk'.  Positive CHADS2 values include History of HTN.  Negative CHADS2 values include Age > 47 years old.  The start date was 12/18/2006.  His last INR was 2.5 ratio.  Anticoagulation responsible provider: Tenny Craw MD, Gunnar Fusi.  INR POC: 1.9.  Cuvette Lot#: [id number].  Exp: 11/2010.    Anticoagulation Management Assessment/Plan:      The patient's current anticoagulation dose is Warfarin sodium 5 mg tabs: Take as directed by coumadin clinic..  The target INR is 2 - 3.  The next INR is due 10/20/2009.  Anticoagulation instructions were given to patient.  Results were reviewed/authorized by Shelby Dubin, PharmD, BCPS, CPP.  He was notified by Shelby Dubin PharmD, BCPS, CPP.         Prior Anticoagulation Instructions: INR 2.0 Continue 5mg s everyday except 2.5mg s on Tuesdays, Thursdays and Sundays. Recheck in 4 weeks.   Current Anticoagulation  Instructions: INR 1.9  Take 1.5 tabs today, then resume 1 tab daily except 0.5 tab each Sunday, Tuesday, Thursday.  Recheck in 4 weeks.

## 2010-09-12 NOTE — Medication Information (Signed)
Summary: [name] [name]  Anticoagulant Therapy  Managed by: Weston Brass, PharmD Referring MD: Willa Rough MD PCP: Julieanne Manson [name] Supervising MD: Juanda Chance MD, Duong Haydel Indication 1: Atrial Fibrillation (ICD-427.31) Indication 2: Pulmonary Embolism and Infarction (ICD-415.1) Lab Used: [facility] Site: [facility] INR POC 2.0 INR RANGE 2 - 3  Dietary changes: no    Health status changes: no    Bleeding/hemorrhagic complications: no    Recent/future hospitalizations: no    Any changes in medication regimen? yes       Details: Patient restarted taking aspirin 81mg  daily today.  He had stopped when he went on the Lovenox.   Recent/future dental: no  Any missed doses?: no       Is patient compliant with meds? yes       Allergies: 1)  ! Diltiazem Hcl Er Beads 2)  ! Amiodarone Hcl 3)  ! * Purinethol 4)  ! * Metronidazole 5)  ! * Remicade 6)  ! * Asacol 7)  ! Codeine 8)  ! * Pyridostigmine 9)  ! Methotrexate  Anticoagulation Management History:      The patient is taking warfarin and comes in today for a routine follow up visit.  Positive risk factors for bleeding include an age of 60 years or older.  The bleeding index is 'intermediate risk'.  Positive CHADS2 values include History of HTN.  Negative CHADS2 values include Age > 30 years old.  The start date was 12/18/2006.  His last INR was 2.5 ratio.  Anticoagulation responsible provider: Juanda Chance MD, Smitty Cords.  INR POC: 2.0.  Cuvette Lot#: [id number].  Exp: 05/2011.    Anticoagulation Management Assessment/Plan:      The patient's current anticoagulation dose is Warfarin sodium 5 mg tabs: Take as directed by coumadin clinic..  The target INR is 2 - 3.  The next INR is due 04/11/2010.  Anticoagulation instructions were given to patient.  Results were reviewed/authorized by Weston Brass, PharmD.  He was notified by Gweneth Fritter, PharmD Candidate.         Prior Anticoagulation Instructions: INR 1.9  Take 2 tablets (10mg ) tonight.  Tomorrow  use your last Lovenox injection.  Also, resume normal Coumadin Schedule or taking 1 tablet (5mg ) every day except take 1/2 tablet (2.5mg ) on Sundays and Thursdays.    Current Anticoagulation Instructions: INR 2.0  Take 1 tablet (5mg ) every day except take 1/2 tablet (2.5mg ) on Thursdays.  Recheck in 2 weeks.

## 2010-09-12 NOTE — Medication Information (Signed)
Summary: rov/ewj  Anticoagulant Therapy  Managed by: Cloyde Reams, RN, BSN Referring MD: Willa Rough MD PCP: Julieanne Manson [name] Supervising MD: Gala Romney MD, Reuel Boom Indication 1: Atrial Fibrillation (ICD-427.31) Indication 2: Pulmonary Embolism and Infarction (ICD-415.1) Lab Used: [facility] Site: [facility] INR POC 1.8 INR RANGE 2 - 3  Dietary changes: no    Health status changes: no    Bleeding/hemorrhagic complications: no    Recent/future hospitalizations: no    Any changes in medication regimen? no    Recent/future dental: no  Any missed doses?: no       Is patient compliant with meds? yes       Allergies: 1)  ! Diltiazem Hcl Er Beads 2)  ! Amiodarone Hcl 3)  ! * Purinethol 4)  ! * Metronidazole 5)  ! * Remicade 6)  ! * Asacol 7)  ! Codeine 8)  ! * Pyridostigmine 9)  ! Methotrexate  Anticoagulation Management History:      The patient is taking warfarin and comes in today for a routine follow up visit.  Positive risk factors for bleeding include an age of 70 years or older.  The bleeding index is 'intermediate risk'.  Positive CHADS2 values include History of HTN.  Negative CHADS2 values include Age > 71 years old.  The start date was 12/18/2006.  His last INR was 2.5 ratio.  Anticoagulation responsible provider: Shaquna Geigle MD, Reuel Boom.  INR POC: 1.8.  Cuvette Lot#: [id number].  Exp: 06/2011.    Anticoagulation Management Assessment/Plan:      The patient's current anticoagulation dose is Warfarin sodium 5 mg tabs: Take as directed by coumadin clinic..  The target INR is 2 - 3.  The next INR is due 06/20/2010.  Anticoagulation instructions were given to patient.  Results were reviewed/authorized by Cloyde Reams, RN, BSN.  He was notified by Cloyde Reams RN.         Prior Anticoagulation Instructions: INR 2.3  Continue on same dosage 1 tablet daily except 1.5 tablets on Wednesdays.  Recheck in 3 weeks.    Current Anticoagulation Instructions: INR  1.8  Take 2 tablets today, then start taking 1 tablet daily except 1.5 tablets on Wednesdays and Saturdays.  Recheck in 3 weeks.

## 2010-09-12 NOTE — Medication Information (Signed)
Summary: rov.mp  Anticoagulant Therapy  Managed by: Bethena Midget, RN, BSN Referring MD: Willa Rough MD PCP: Julieanne Manson [name] Supervising MD: Myrtis Ser MD, Tinnie Gens Indication 1: Atrial Fibrillation (ICD-427.31) Indication 2: Pulmonary Embolism and Infarction (ICD-415.1) Lab Used: [facility] Site: [facility] INR POC 1.9 INR RANGE 2 - 3  Dietary changes: yes       Details: actually ate less green leafy veggies  Health status changes: no    Bleeding/hemorrhagic complications: no    Recent/future hospitalizations: no    Any changes in medication regimen? no    Recent/future dental: no  Any missed doses?: no       Is patient compliant with meds? yes      Comments: Seeing Dr. Myrtis Ser today.   Allergies: 1)  ! Diltiazem Hcl Er Beads 2)  ! Amiodarone Hcl 3)  ! * Purinethol 4)  ! * Metronidazole 5)  ! * Remicade 6)  ! * Asacol 7)  ! Codeine 8)  ! * Pyridostigmine 9)  ! Methotrexate  Anticoagulation Management History:      The patient is taking warfarin and comes in today for a routine follow up visit.  Positive risk factors for bleeding include an age of 34 years or older.  The bleeding index is 'intermediate risk'.  Positive CHADS2 values include History of HTN.  Negative CHADS2 values include Age > 9 years old.  The start date was 12/18/2006.  His last INR was 2.5 ratio.  Anticoagulation responsible provider: Myrtis Ser MD, Tinnie Gens.  INR POC: 1.9.  Cuvette Lot#: [id number].  Exp: 12/2010.    Anticoagulation Management Assessment/Plan:      The patient's current anticoagulation dose is Warfarin sodium 5 mg tabs: Take as directed by coumadin clinic..  The target INR is 2 - 3.  The next INR is due 11/10/2009.  Anticoagulation instructions were given to patient.  Results were reviewed/authorized by Bethena Midget, RN, BSN.  He was notified by Bethena Midget, RN, BSN.         Prior Anticoagulation Instructions: INR 1.9  Take 1.5 tabs today, then resume 1 tab daily except 0.5 tab each  Sunday, Tuesday, Thursday.  Recheck in 4 weeks.   Current Anticoagulation Instructions: INR 1.9 Change dose to 5mg s everyday except 2.5mg s on Sundays and Thrusdays. Recheck in 2 1/2  weeks.

## 2010-09-12 NOTE — Medication Information (Signed)
Summary: rov/ewj  Anticoagulant Therapy  Managed by: Bethena Midget, RN, BSN Referring MD: Willa Rough MD PCP: Julieanne Manson [name] Supervising MD: Daleen Squibb MD, Maisie Fus Indication 1: Atrial Fibrillation (ICD-427.31) Indication 2: Pulmonary Embolism and Infarction (ICD-415.1) Lab Used: [facility] Site: [facility] INR POC 2.2 INR RANGE 2 - 3  Dietary changes: no    Health status changes: no    Bleeding/hemorrhagic complications: no    Recent/future hospitalizations: yes       Details: endoscopy and colonoscopy on 03/20/10, will hold coumadin for five days  Any changes in medication regimen? no    Recent/future dental: no  Any missed doses?: no       Is patient compliant with meds? yes      Comments: Flagged Dr Myrtis Ser concerning Lovenox Bridging, he replied that pt will need to be bridged.  Allergies: 1)  ! Diltiazem Hcl Er Beads 2)  ! Amiodarone Hcl 3)  ! * Purinethol 4)  ! * Metronidazole 5)  ! * Remicade 6)  ! * Asacol 7)  ! Codeine 8)  ! * Pyridostigmine 9)  ! Methotrexate  Anticoagulation Management History:      The patient is taking warfarin and comes in today for a routine follow up visit.  Positive risk factors for bleeding include an age of 70 years or older.  The bleeding index is 'intermediate risk'.  Positive CHADS2 values include History of HTN.  Negative CHADS2 values include Age > 71 years old.  The start date was 12/18/2006.  His last INR was 2.5 ratio.  Anticoagulation responsible provider: Daleen Squibb MD, Maisie Fus.  INR POC: 2.2.  Cuvette Lot#: [id number].  Exp: 05/2011.    Anticoagulation Management Assessment/Plan:      The patient's current anticoagulation dose is Warfarin sodium 5 mg tabs: Take as directed by coumadin clinic..  The target INR is 2 - 3.  The next INR is due 03/16/2010.  Anticoagulation instructions were given to patient.  Results were reviewed/authorized by Bethena Midget, RN, BSN.  He was notified by Bethena Midget RN, BSN.         Prior Anticoagulation  Instructions: INR 2.3  Continue on same dosage 1 tablet daily except 1/2 tablet on Sundays and Thursdays.  Recheck in 4 weeks.    Current Anticoagulation Instructions: INR 2.2  Continue same dose of 1 tab daily except for 1/2 tab on Sunday and Thursday.  Take last dose of Coumadin on 8/3 and hold through 8/8.  Flag sent to Dr. Myrtis Ser.  Will follow up with patient then.  Spoke with pt he is aware that he will need Lovenox, and to take last dose on 8/3 and RTC on 8/5 for bridging instructions. Bethena Midget, RN, BSN  March 06, 2010 10:13 AM

## 2010-09-12 NOTE — Medication Information (Signed)
Summary: rov/jk  Anticoagulant Therapy  Managed by: Weston Brass, PharmD Referring MD: Willa Rough MD PCP: Julieanne Manson [name] Supervising MD: Shirlee Latch MD, Dalton Indication 1: Atrial Fibrillation (ICD-427.31) Indication 2: Pulmonary Embolism and Infarction (ICD-415.1) Lab Used: [facility] Site: [facility] INR POC 1.9 INR RANGE 2 - 3  Dietary changes: no    Health status changes: no    Bleeding/hemorrhagic complications: yes       Details: Stomach bruising from Lovenox injections.  Recent/future hospitalizations: no    Any changes in medication regimen? yes       Details: Pt is currently taking Lovenox due to stopping Coumadin for colonoscopy.   Recent/future dental: no  Any missed doses?: no       Is patient compliant with meds? yes       Allergies: 1)  ! Diltiazem Hcl Er Beads 2)  ! Amiodarone Hcl 3)  ! * Purinethol 4)  ! * Metronidazole 5)  ! * Remicade 6)  ! * Asacol 7)  ! Codeine 8)  ! * Pyridostigmine 9)  ! Methotrexate  Anticoagulation Management History:      The patient is taking warfarin and comes in today for a routine follow up visit.  Positive risk factors for bleeding include an age of 70 years or older.  The bleeding index is 'intermediate risk'.  Positive CHADS2 values include History of HTN.  Negative CHADS2 values include Age > 17 years old.  The start date was 12/18/2006.  His last INR was 2.5 ratio.  Anticoagulation responsible provider: Shirlee Latch MD, Dalton.  INR POC: 1.9.  Cuvette Lot#: [id number].  Exp: 05/2011.    Anticoagulation Management Assessment/Plan:      The patient's current anticoagulation dose is Warfarin sodium 5 mg tabs: Take as directed by coumadin clinic..  The target INR is 2 - 3.  The next INR is due 03/28/2010.  Anticoagulation instructions were given to patient.  Results were reviewed/authorized by Weston Brass, PharmD.  He was notified by Gweneth Fritter, PharmD Candidate.         Prior Anticoagulation Instructions: INR 1.3  Take 2  tablets (10mg ) today, Friday.  Take 1.5 tablets (7.5mg ) tomorrow, Saturday.  Take 1 tablet (5mg ) on Sunday.  Then resume normal Coumadin Schedule of taking 1 tablet (5mg) every day except take 1/2 tablet (2.5mg) on Sundays and Thursdays.  Recheck on Monday.   Current Anticoagulation Instructions: INR 1.9  Take 2 tablets (10mg) tonight.  Tomorrow use your last Lovenox injection.  Also, resume normal Coumadin Schedule or taking 1 tablet (5mg) every day except take 1/2 tablet (2.5mg) on Sundays and Thursdays.   Prescriptions: WARFARIN SODIUM 5 MG TABS (WARFARIN SODIUM) Take as directed by coumadin clinic.  #30 x 3   Entered by:   Sally Putt PharmD   Authorized by:   Jeffrey David Katz, MD, FACC   Signed by:   Sally Putt PharmD on 03/26/2010   Method used:   Electronically to        [facility]  [address] [id number] (retail)       [address]       [place], [place]  [address]       Ph: [phone] or [phone]       Fax: [phone]   RxID:   [id number]

## 2010-09-12 NOTE — Medication Information (Signed)
Summary: rov/tm  Anticoagulant Therapy  Managed by: Bethena Midget, RN, BSN Referring MD: Willa Rough MD PCP: Julieanne Manson [name] Supervising MD: Tenny Craw MD, Gunnar Fusi Indication 1: Atrial Fibrillation (ICD-427.31) Indication 2: Pulmonary Embolism and Infarction (ICD-415.1) Lab Used: [facility] Site: [facility] INR POC 2.2 INR RANGE 2 - 3  Dietary changes: no    Health status changes: no    Bleeding/hemorrhagic complications: no    Recent/future hospitalizations: no    Any changes in medication regimen? no    Recent/future dental: no  Any missed doses?: no       Is patient compliant with meds? yes       Allergies: 1)  ! Diltiazem Hcl Er Beads 2)  ! Amiodarone Hcl 3)  ! * Purinethol 4)  ! * Metronidazole 5)  ! * Remicade 6)  ! * Asacol 7)  ! Codeine 8)  ! * Pyridostigmine 9)  ! Methotrexate  Anticoagulation Management History:      The patient is taking warfarin and comes in today for a routine follow up visit.  Positive risk factors for bleeding include an age of 70 years or older.  The bleeding index is 'intermediate risk'.  Positive CHADS2 values include History of HTN.  Negative CHADS2 values include Age > 27 years old.  The start date was 12/18/2006.  His last INR was 2.5 ratio.  Anticoagulation responsible provider: Tenny Craw MD, Gunnar Fusi.  INR POC: 2.2.  Exp: 12/2010.    Anticoagulation Management Assessment/Plan:      The patient's current anticoagulation dose is Warfarin sodium 5 mg tabs: Take as directed by coumadin clinic..  The target INR is 2 - 3.  The next INR is due 12/01/2009.  Anticoagulation instructions were given to patient.  Results were reviewed/authorized by Bethena Midget, RN, BSN.  He was notified by Bethena Midget, RN, BSN.         Prior Anticoagulation Instructions: INR 1.9 Change dose to 5mg s everyday except 2.5mg s on Sundays and Thrusdays. Recheck in 2 1/2  weeks.  Current Anticoagulation Instructions: INR 2.2 Continue 5mg s everyday except 2.5mg s on  Sundays and Thursdays. Recheck in 3 weeks.

## 2010-09-12 NOTE — Medication Information (Signed)
Summary: rov/tm  Anticoagulant Therapy  Managed by: Eda Keys, PharmD Referring MD: Willa Rough MD PCP: Julieanne Manson [name] Supervising MD: Myrtis Ser MD, Tinnie Gens Indication 1: Atrial Fibrillation (ICD-427.31) Indication 2: Pulmonary Embolism and Infarction (ICD-415.1) Lab Used: [facility] Site: [facility] INR POC 2.2 INR RANGE 2 - 3  Dietary changes: no    Health status changes: no    Bleeding/hemorrhagic complications: no    Recent/future hospitalizations: no    Any changes in medication regimen? no    Recent/future dental: no  Any missed doses?: no       Is patient compliant with meds? yes       Allergies: 1)  ! Diltiazem Hcl Er Beads 2)  ! Amiodarone Hcl 3)  ! * Purinethol 4)  ! * Metronidazole 5)  ! * Remicade 6)  ! * Asacol 7)  ! Codeine 8)  ! * Pyridostigmine 9)  ! Methotrexate  Anticoagulation Management History:      The patient is taking warfarin and comes in today for a routine follow up visit.  Positive risk factors for bleeding include an age of 71 years or older.  The bleeding index is 'intermediate risk'.  Positive CHADS2 values include History of HTN.  Negative CHADS2 values include Age > 12 years old.  The start date was 12/18/2006.  His last INR was 2.5 ratio.  Anticoagulation responsible provider: Myrtis Ser MD, Tinnie Gens.  INR POC: 2.2.  Cuvette Lot#: [id number].  Exp: 03/2011.    Anticoagulation Management Assessment/Plan:      The patient's current anticoagulation dose is Warfarin sodium 5 mg tabs: Take as directed by coumadin clinic..  The target INR is 2 - 3.  The next INR is due 02/02/2010.  Anticoagulation instructions were given to patient.  Results were reviewed/authorized by Eda Keys, PharmD.  He was notified by Eda Keys.         Prior Anticoagulation Instructions: INR 2.0 Continue 5mg s daily except 2.5mg s on Sundays and Thursdays. Recheck in 4 weeks.   Current Anticoagulation Instructions: INR 2.2  Continue taking 1/2  tablet on Sunday adn Thursday and 1 tablet all other days.  return to clinic in 4 weeks.

## 2010-09-12 NOTE — Medication Information (Signed)
Summary: rov/tm  Anticoagulant Therapy  Managed by: Weston Brass, PharmD Referring MD: Willa Rough MD PCP: Julieanne Manson [name] 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 yes       Details: Started Augmentin July 25th for 15 days. Last day Monday.   Recent/future dental: no  Any missed doses?: yes     Details: Stopped taking Coumadin on Wed.  Is patient compliant with meds? yes      Comments: Augmentin 875/125 mg tab 1 tab twice daily.  Stopped Coumadin on Wednesday (8/3) for upcoming endoscopy/colonoscopy on Tuesday (8/9).  Per Dr. Myrtis Ser, needs Lovenox bridging.   Allergies: 1)  ! Diltiazem Hcl Er Beads 2)  ! Amiodarone Hcl 3)  ! * Purinethol 4)  ! * Metronidazole 5)  ! * Remicade 6)  ! * Asacol 7)  ! Codeine 8)  ! * Pyridostigmine 9)  ! Methotrexate  Anticoagulation Management History:      The patient is taking warfarin and comes in today for a routine follow up visit.  Positive risk factors for bleeding include an age of 71 years or older.  The bleeding index is 'intermediate risk'.  Positive CHADS2 values include History of HTN.  Negative CHADS2 values include Age > 71 years old.  The start date was 12/18/2006.  His last INR was 2.5 ratio.  Anticoagulation responsible Colonel Krauser: Excell Seltzer MD, Casimiro Needle.  INR POC: 1.9.  Cuvette Lot#: [id number].  Exp: 05/2011.    Anticoagulation Management Assessment/Plan:      The patient's current anticoagulation dose is Warfarin sodium 5 mg tabs: Take as directed by coumadin clinic..  The target INR is 2 - 3.  The next INR is due 03/23/2010.  Anticoagulation instructions were given to patient.  Results were  reviewed/authorized by Weston Brass, PharmD.  He was notified by Weston Brass PharmD.         Prior Anticoagulation Instructions: INR 2.2  Continue same dose of 1 tab daily except for 1/2 tab on Sunday and Thursday.  Take last dose of Coumadin on 8/3 and hold through 8/8.  Flag sent to Dr. Myrtis Ser.  Will follow up with patient then.  Spoke with pt he is aware that he will need Lovenox, and to take last dose on 8/3 and RTC on 8/5 for bridging instructions. Bethena Midget, RN, BSN  March 06, 2010 10:13 AM   Current Anticoagulation Instructions: INR 1.9  Start Lovenox 150mg  daily today.  Take last dose of Lovenox on 8/8 in the AM.  Procedure on 8/9.  When okay with MD, Restart Lovenox once daily and Coumadin 2 tablets x 2 days then resume normal dose of 1 tablet every day except 1/2 tablet on Sunday and Thursday.   Prescriptions: LOVENOX 150 MG/ML SOLN (ENOXAPARIN SODIUM) Inject 1 syringe subcutaneously daily as directed by Coumadin Clinic  #10 x 0   Entered by:   Reina Fuse PharmD   Authorized by:   Talitha Givens, MD, [facility]   Signed by:   Reina Fuse PharmD on 03/16/2010   Method used:   Electronically to        [facility] [phone]* (retail)  [address]       [place], [place]  [address]       Ph: [phone] or [phone]       Fax: [phone]   RxID:   [id number]

## 2010-09-12 NOTE — Miscellaneous (Signed)
  Clinical Lists Changes  Observations: Added new observation of PAST MED HX:  History of syncope. Evaluation by Dr. Graciela Husbands: Syncope probably neurally mediated and modest resting sinus bradycardia. Monitor her revealed sinus bradycardia and 16 B. episode of supraventricular tachycardia that was either atrial fibrillation or another type of SVT. Patient improved with combination of holding ACE inhibitor or low blood pressure and reducing beta blocker for bradycardia. Crohn's disease treated history of orthostasis History supraventricular tachycardia  Hypertension Anemia related to Crohn's disease Intermittent steroid use as needed Status post cholecystectomy History of colon tumor Pulmonary embolus 2007 documented by CT. IVC filter placed because of concern for Coumadin use at that time. History of IVC filter Coumadin therapy Question sleep apnea EF  55-60%  ...echo... April, 2009 Mitral regurgitation.... mild.... echo.... 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 This is additional reason for Coumadin. CAD... relook catheter July 1,2010= all 5 grafts are patent. 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  (10/24/2009 9:56) Added new observation of REFERRING MD: Julieanne Manson [name] (10/24/2009 9:56) Added new observation of PRIMARY MD: Julieanne Manson [name] (10/24/2009 9:56)       Past History:  Past Medical History:  History of syncope. Evaluation by Dr. Graciela Husbands: Syncope probably neurally mediated and modest resting sinus bradycardia. Monitor her revealed sinus bradycardia and 16 B. episode of supraventricular tachycardia that was either atrial fibrillation or another type of  SVT. Patient improved with combination of holding ACE inhibitor or low blood pressure and reducing beta blocker for bradycardia. Crohn's disease treated history of orthostasis History supraventricular tachycardia  Hypertension Anemia related to Crohn's disease Intermittent steroid use as needed Status post cholecystectomy History of colon tumor Pulmonary embolus 2007 documented by CT. IVC filter placed because of concern for Coumadin use at that time. History of IVC filter Coumadin therapy Question sleep apnea EF  55-60%  ...echo... April, 2009 Mitral regurgitation.... mild.... echo.... 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 This is additional reason for Coumadin. CAD... relook catheter July 1,2010= all 5 grafts are patent. 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

## 2010-09-12 NOTE — Medication Information (Signed)
Summary: rov/ewj  Anticoagulant Therapy  Managed by: Weston Brass, PharmD Referring MD: Willa Rough MD PCP: Julieanne Manson [name] 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 no    Recent/future dental: no  Any missed doses?: no       Is patient compliant with meds? yes       Allergies: 1)  ! Diltiazem Hcl Er Beads 2)  ! Amiodarone Hcl 3)  ! * Purinethol 4)  ! * Metronidazole 5)  ! * Remicade 6)  ! * Asacol 7)  ! Codeine 8)  ! * Pyridostigmine 9)  ! Methotrexate  Anticoagulation Management History:      The patient is taking warfarin and comes in today for a routine follow up visit.  Positive risk factors for bleeding include an age of 70 years or older.  The bleeding index is 'intermediate risk'.  Positive CHADS2 values include History of HTN.  Negative CHADS2 values include Age > 59 years old.  The start date was 12/18/2006.  His last INR was 2.5 ratio.  Anticoagulation responsible provider: Shirlee Latch MD, Dalton.  INR POC: 2.7.  Exp: 06/2011.    Anticoagulation Management Assessment/Plan:      The patient's current anticoagulation dose is Warfarin sodium 5 mg tabs: Take as directed by coumadin clinic..  The target INR is 2 - 3.  The next INR is due 07/18/2010.  Anticoagulation instructions were given to patient.  Results were reviewed/authorized by Weston Brass, PharmD.  He was notified by Weston Brass PharmD.         Prior Anticoagulation Instructions: INR 1.8  Take 2 tablets today, then start taking 1 tablet daily except 1.5 tablets on Wednesdays and Saturdays.  Recheck in 3 weeks.    Current Anticoagulation Instructions: INR 2.7  Continue  same dose of 1 tablet every day except 1 1/2 tablets on Wednesday and Saturday.  Recheck INR in 4 weeks.

## 2010-09-12 NOTE — Medication Information (Signed)
Summary: rov/sp  Anticoagulant Therapy  Managed by: Weston Brass, PharmD Referring MD: Willa Rough MD PCP: Julieanne Manson [name] 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  Recent/future hospitalizations: no    Any changes in medication regimen? yes       Details: Pt had colonoscopy and restarted Coumadin Tuesday.  He took 10 mg on Tues and Wed and then resumed normal schedule on Thurs.  Currently using Lovenox once daily.   Recent/future dental: no  Any missed doses?: no       Is patient compliant with meds? yes       Allergies: 1)  ! Diltiazem Hcl Er Beads 2)  ! Amiodarone Hcl 3)  ! * Purinethol 4)  ! * Metronidazole 5)  ! * Remicade 6)  ! * Asacol 7)  ! Codeine 8)  ! * Pyridostigmine 9)  ! Methotrexate  Anticoagulation Management History:      The patient is taking warfarin and comes in today for a routine follow up visit.  Positive risk factors for bleeding include an age of 70 years or older.  The bleeding index is 'intermediate risk'.  Positive CHADS2 values include History of HTN.  Negative CHADS2 values include Age > 71 years old.  The start date was 12/18/2006.  His last INR was 2.5 ratio.  Anticoagulation responsible provider: Tenny Craw MD, Gunnar Fusi.  INR POC: 1.3.  Cuvette Lot#: [id number].  Exp: 05/2011.    Anticoagulation Management Assessment/Plan:      The patient's current anticoagulation dose is Warfarin sodium 5 mg tabs: Take as directed by coumadin clinic..  The target INR is 2 - 3.  The next INR is due 03/26/2010.  Anticoagulation instructions were given to patient.  Results were reviewed/authorized by Weston Brass, PharmD.  He was notified by  Gweneth Fritter, PharmD Candidate.         Prior Anticoagulation Instructions: INR 1.9  Start Lovenox 150mg  daily today.  Take last dose of Lovenox on 8/8 in the AM.  Procedure on 8/9.  When okay with MD, Restart Lovenox once daily and Coumadin 2 tablets x 2 days then resume normal dose of 1 tablet every day except 1/2 tablet on Sunday and Thursday.    Current Anticoagulation Instructions: INR 1.3  Take 2 tablets (10mg ) today, Friday.  Take 1.5 tablets (7.5mg ) tomorrow, Saturday.  Take 1 tablet (5mg ) on Sunday.  Then resume normal Coumadin Schedule of taking 1 tablet (5mg ) every day except take 1/2 tablet (2.5mg ) on Sundays and Thursdays.  Recheck on Monday.

## 2010-09-12 NOTE — Medication Information (Signed)
Summary: [name]  Anticoagulant Therapy  Managed by: Cloyde Reams, RN, BSN Referring MD: Willa Rough MD PCP: Julieanne Manson [name] 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 no    Recent/future dental: no  Any missed doses?: no       Is patient compliant with meds? yes       Allergies: 1)  ! Diltiazem Hcl Er Beads 2)  ! Amiodarone Hcl 3)  ! * Purinethol 4)  ! * Metronidazole 5)  ! * Remicade 6)  ! * Asacol 7)  ! Codeine 8)  ! * Pyridostigmine 9)  ! Methotrexate  Anticoagulation Management History:      The patient is taking warfarin and comes in today for a routine follow up visit.  Positive risk factors for bleeding include an age of 48 years or older.  The bleeding index is 'intermediate risk'.  Positive CHADS2 values include History of HTN.  Negative CHADS2 values include Age > 46 years old.  The start date was 12/18/2006.  His last INR was 2.5 ratio.  Anticoagulation responsible provider: Shirlee Latch MD, Dalton.  INR POC: 2.3.  Cuvette Lot#: [id number].  Exp: 06/2011.    Anticoagulation Management Assessment/Plan:      The patient's current anticoagulation dose is Warfarin sodium 5 mg tabs: Take as directed by coumadin clinic..  The target INR is 2 - 3.  The next INR is due 05/30/2010.  Anticoagulation instructions were given to patient.  Results were reviewed/authorized by Cloyde Reams, RN, BSN.  He was notified by Cloyde Reams RN.         Prior Anticoagulation Instructions: INR 1.8  Start taking 1 tablet daily except 1.5 tablets on Wednesdays.  Recheck in 2 weeks.    Current Anticoagulation Instructions: INR 2.3  Continue on same  dosage 1 tablet daily except 1.5 tablets on Wednesdays.  Recheck in 3 weeks.

## 2010-09-12 NOTE — Medication Information (Signed)
Summary: rov/tm  Anticoagulant Therapy  Managed by: Bethena Midget, RN, BSN Referring MD: Willa Rough MD PCP: Julieanne Manson [name] 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 no    Recent/future dental: no  Any missed doses?: no       Is patient compliant with meds? yes       Allergies: 1)  ! Diltiazem Hcl Er Beads 2)  ! Amiodarone Hcl 3)  ! * Purinethol 4)  ! * Metronidazole 5)  ! * Remicade 6)  ! * Asacol 7)  ! Codeine 8)  ! * Pyridostigmine 9)  ! Methotrexate  Anticoagulation Management History:      The patient is taking warfarin and comes in today for a routine follow up visit.  Positive risk factors for bleeding include an age of 23 years or older.  The bleeding index is 'intermediate risk'.  Positive CHADS2 values include History of HTN.  Negative CHADS2 values include Age > 13 years old.  The start date was 12/18/2006.  His last INR was 2.5 ratio.  Anticoagulation responsible provider: Bensimhon MD, Reuel Boom.  INR POC: 2.0.  Cuvette Lot#: [id number].  Exp: 11/2010.    Anticoagulation Management Assessment/Plan:      The patient's current anticoagulation dose is Warfarin sodium 5 mg tabs: Take as directed by coumadin clinic..  The target INR is 2 - 3.  The next INR is due 09/22/2009.  Anticoagulation instructions were given to patient.  Results were reviewed/authorized by Bethena Midget, RN, BSN.  He was notified by Bethena Midget, RN, BSN.         Prior Anticoagulation Instructions: INR 2.3 Continue 5mg s everyday except 2.5mg s on Tuesdays, Thursdays and Sundays. Recheck in 4 weeks.   Current Anticoagulation Instructions: INR  2.0 Continue 5mg s everyday except 2.5mg s on Tuesdays, Thursdays and Sundays. Recheck in 4 weeks.

## 2010-09-12 NOTE — Medication Information (Signed)
Summary: rov/tm  Anticoagulant Therapy  Managed by: Cloyde Reams, RN, BSN Referring MD: Willa Rough MD PCP: Julieanne Manson [name] 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 no    Recent/future dental: no  Any missed doses?: no       Is patient compliant with meds? yes       Allergies: 1)  ! Diltiazem Hcl Er Beads 2)  ! Amiodarone Hcl 3)  ! * Purinethol 4)  ! * Metronidazole 5)  ! * Remicade 6)  ! * Asacol 7)  ! Codeine 8)  ! * Pyridostigmine 9)  ! Methotrexate  Anticoagulation Management History:      The patient is taking warfarin and comes in today for a routine follow up visit.  Positive risk factors for bleeding include an age of 71 years or older.  The bleeding index is 'intermediate risk'.  Positive CHADS2 values include History of HTN.  Negative CHADS2 values include Age > 71 years old.  The start date was 12/18/2006.  His last INR was 2.5 ratio.  Anticoagulation responsible [name] [name]: gollan.  INR POC: 1.8.  Cuvette Lot#: [id number].  Exp: 05/2011.    Anticoagulation Management Assessment/Plan:      The patient's current anticoagulation dose is Warfarin sodium 5 mg tabs: Take as directed by coumadin clinic..  The target INR is 2 - 3.  The next INR is due 05/09/2010.  Anticoagulation instructions were given to patient.  Results were reviewed/authorized by Cloyde Reams, RN, BSN.  He was notified by Cloyde Reams RN.         Prior Anticoagulation Instructions: INR 1.5  Today, August 31st, take 1.5 tablets (7.5mg ).  Then begin taking 1 tablet (5mg ) every day.  Recheck in 2 weeks.  Current Anticoagulation Instructions: INR 1.8  Start taking 1  tablet daily except 1.5 tablets on Wednesdays.  Recheck in 2 weeks.

## 2010-09-12 NOTE — Medication Information (Signed)
Summary: rov/tm  Anticoagulant Therapy  Managed by: Bethena Midget, RN, BSN Referring MD: Willa Rough MD PCP: Julieanne Manson [name] Supervising MD: Excell Seltzer MD, Casimiro Needle Indication 1: Atrial Fibrillation (ICD-427.31) Indication 2: Pulmonary Embolism and Infarction (ICD-415.1) Lab Used: [facility] Site: [facility] INR POC 2.0 INR RANGE 2 - 3  Dietary changes: no    Health status changes: yes       Details: Sinus Headache  Bleeding/hemorrhagic complications: no    Recent/future hospitalizations: no    Any changes in medication regimen? yes       Details: Sudafed PRN for few days.   Recent/future dental: no  Any missed doses?: no         Allergies: 1)  ! Diltiazem Hcl Er Beads 2)  ! Amiodarone Hcl 3)  ! * Purinethol 4)  ! * Metronidazole 5)  ! * Remicade 6)  ! * Asacol 7)  ! Codeine 8)  ! * Pyridostigmine 9)  ! Methotrexate  Anticoagulation Management History:      The patient is taking warfarin and comes in today for a routine follow up visit.  Positive risk factors for bleeding include an age of 71 years or older.  The bleeding index is 'intermediate risk'.  Positive CHADS2 values include History of HTN.  Negative CHADS2 values include Age > 71 years old.  The start date was 12/18/2006.  His last INR was 2.5 ratio.  Anticoagulation responsible provider: Excell Seltzer MD, Casimiro Needle.  INR POC: 2.0.  Cuvette Lot#: [id number].  Exp: 01/2011.    Anticoagulation Management Assessment/Plan:      The patient's current anticoagulation dose is Warfarin sodium 5 mg tabs: Take as directed by coumadin clinic..  The target INR is 2 - 3.  The next INR is due 01/05/2010.  Anticoagulation instructions were given to patient.  Results were reviewed/authorized by Bethena Midget, RN, BSN.  He was notified by Bethena Midget, RN, BSN.         Prior Anticoagulation Instructions: INR 2.2 Continue 5mg s everyday except 2.5mg s on Sundays and Thursdays. Recheck in 3 weeks.   Current Anticoagulation  Instructions: INR 2.0 Continue 5mg s daily except 2.5mg s on Sundays and Thursdays. Recheck in 4 weeks.

## 2010-09-12 NOTE — Letter (Signed)
Summary: Appointment - Reminder 2  [facility], Main Office  [address]   [place], [place] [address]   Phone: [phone]  Fax: [phone]     September 08, 2009 MRN: [id number]   MARDELL CRAGG [address] [place], [place]  [address]   Dear Mr. GRIBBLE,  Our records indicate that it is time to schedule a follow-up appointment with Dr. Myrtis Ser. It is very important that we reach you to schedule this appointment. We look forward to participating in your health care needs. Please contact us at the number listed above at your earliest convenience to schedule your appointment.  If you are unable to make an appointment at this time, give [place] a call so we can update our records.   Sincerely,   Migdalia Dk [facility] Scheduling Team

## 2010-09-12 NOTE — Medication Information (Signed)
Summary: rov/eac  Anticoagulant Therapy  Managed by: Cloyde Reams, RN, BSN Referring MD: Willa Rough MD PCP: Julieanne Manson [name] 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 no    Recent/future dental: no  Any missed doses?: no       Is patient compliant with meds? yes       Allergies: 1)  ! Diltiazem Hcl Er Beads 2)  ! Amiodarone Hcl 3)  ! * Purinethol 4)  ! * Metronidazole 5)  ! * Remicade 6)  ! * Asacol 7)  ! Codeine 8)  ! * Pyridostigmine 9)  ! Methotrexate  Anticoagulation Management History:      The patient is taking warfarin and comes in today for a routine follow up visit.  Positive risk factors for bleeding include an age of 71 years or older.  The bleeding index is 'intermediate risk'.  Positive CHADS2 values include History of HTN.  Negative CHADS2 values include Age > 71 years old.  The start date was 12/18/2006.  His last INR was 2.5 ratio.  Anticoagulation responsible provider: Riley Kill MD, Maisie Fus.  INR POC: 2.3.  Cuvette Lot#: [id number].  Exp: 03/2011.    Anticoagulation Management Assessment/Plan:      The patient's current anticoagulation dose is Warfarin sodium 5 mg tabs: Take as directed by coumadin clinic..  The target INR is 2 - 3.  The next INR is due 03/02/2010.  Anticoagulation instructions were given to patient.  Results were reviewed/authorized by Cloyde Reams, RN, BSN.  He was notified by Cloyde Reams RN.         Prior Anticoagulation Instructions: INR 2.2  Continue taking 1/2 tablet on Sunday adn Thursday and 1 tablet all other days.  return to clinic in 4 weeks.    Current Anticoagulation  Instructions: INR 2.3  Continue on same dosage 1 tablet daily except 1/2 tablet on Sundays and Thursdays.  Recheck in 4 weeks.

## 2010-09-12 NOTE — Medication Information (Signed)
Summary: rov/sp  Anticoagulant Therapy  Managed by: Bethena Midget, RN, BSN Referring MD: Willa Rough MD PCP: Julieanne Manson [name] Supervising MD: Mariah Milling  Indication 1: Atrial Fibrillation (ICD-427.31) Indication 2: Pulmonary Embolism and Infarction (ICD-415.1) Lab Used: [facility] Site: [facility] INR POC 2.3 INR RANGE 2 - 3  Dietary changes: no    Health status changes: no    Bleeding/hemorrhagic complications: no    Recent/future hospitalizations: no    Any changes in medication regimen? no    Recent/future dental: no  Any missed doses?: no       Is patient compliant with meds? yes       Allergies: 1)  ! Diltiazem Hcl Er Beads 2)  ! Amiodarone Hcl 3)  ! * Purinethol 4)  ! * Metronidazole 5)  ! * Remicade 6)  ! * Asacol 7)  ! Codeine 8)  ! * Pyridostigmine 9)  ! Methotrexate  Anticoagulation Management History:      The patient is taking warfarin and comes in today for a routine follow up visit.  Positive risk factors for bleeding include an age of 21 years or older.  The bleeding index is 'intermediate risk'.  Positive CHADS2 values include History of HTN.  Negative CHADS2 values include Age > 28 years old.  The start date was 12/18/2006.  His last INR was 2.5 ratio.  Anticoagulation responsible Jadamarie Butson: [facility] .  INR POC: 2.3.  Cuvette Lot#: [id number].  Exp: 07/2011.    Anticoagulation Management Assessment/Plan:      The patient's current anticoagulation dose is Warfarin sodium 5 mg tabs: Take as directed by coumadin clinic..  The target INR is 2 - 3.  The next INR is due 08/15/2010.  Anticoagulation instructions were given to patient.  Results were reviewed/authorized by Bethena Midget, RN, BSN.  He was notified by Bethena Midget, RN, BSN.         Prior Anticoagulation Instructions: INR 2.7  Continue same dose of 1 tablet every day except 1 1/2 tablets on Wednesday and Saturday.  Recheck INR in 4 weeks.   Current Anticoagulation Instructions: INR 2.3 Continue  5mg s daily except 7.5mg s on Wednesdays and Saturdays.  Recheck in 4 weeks.

## 2010-09-12 NOTE — Letter (Signed)
Summary: Handout Printed  Printed Handout:  - Coumadin Instructions-w/out Meds 

## 2010-09-12 NOTE — Medication Information (Signed)
Summary: rov/jk  Anticoagulant Therapy  Managed by: Weston Brass, PharmD Referring MD: Willa Rough MD PCP: Julieanne Manson [name] Supervising MD: Myrtis Ser MD, Tinnie Gens Indication 1: Atrial Fibrillation (ICD-427.31) Indication 2: Pulmonary Embolism and Infarction (ICD-415.1) Lab Used: [facility] Site: [facility] INR POC 1.5 INR RANGE 2 - 3  Dietary changes: no    Health status changes: no    Bleeding/hemorrhagic complications: no    Recent/future hospitalizations: no    Any changes in medication regimen? no    Recent/future dental: no  Any missed doses?: no       Is patient compliant with meds? yes       Allergies: 1)  ! Diltiazem Hcl Er Beads 2)  ! Amiodarone Hcl 3)  ! * Purinethol 4)  ! * Metronidazole 5)  ! * Remicade 6)  ! * Asacol 7)  ! Codeine 8)  ! * Pyridostigmine 9)  ! Methotrexate  Anticoagulation Management History:      The patient is taking warfarin and comes in today for a routine follow up visit.  Positive risk factors for bleeding include an age of 71 years or older.  The bleeding index is 'intermediate risk'.  Positive CHADS2 values include History of HTN.  Negative CHADS2 values include Age > 71 years old.  The start date was 12/18/2006.  His last INR was 2.5 ratio.  Anticoagulation responsible Katriana Dortch: Myrtis Ser MD, Tinnie Gens.  INR POC: 1.5.  Cuvette Lot#: [id number].  Exp: 05/2011.    Anticoagulation Management Assessment/Plan:      The patient's current anticoagulation dose is Warfarin sodium 5 mg tabs: Take as directed by coumadin clinic..  The target INR is 2 - 3.  The next INR is due 04/25/2010.  Anticoagulation instructions were given to patient.  Results were reviewed/authorized by Weston Brass, PharmD.  He was notified by Gweneth Fritter, PharmD Candidate.         Prior Anticoagulation Instructions: INR 2.0  Take 1 tablet (5mg ) every day except take 1/2 tablet (2.5mg ) on Thursdays.  Recheck in 2 weeks.     Current Anticoagulation Instructions: INR  1.5  Today, August 31st, take 1.5 tablets (7.5mg ).  Then begin taking 1 tablet (5mg ) every day.  Recheck in 2 weeks.

## 2010-09-13 NOTE — Medication Information (Signed)
Summary: rov/tm  Anticoagulant Therapy  Managed by: Cloyde Reams, RN, BSN Referring MD: Willa Rough MD PCP: Julieanne Manson [name] Supervising MD: Shirlee Latch MD, Emunah Texidor Indication 1: Atrial Fibrillation (ICD-427.31) Indication 2: Pulmonary Embolism and Infarction (ICD-415.1) Lab Used: [facility] Site: [facility] INR POC 2.4 INR RANGE 2 - 3  Dietary changes: no    Health status changes: no    Bleeding/hemorrhagic complications: no    Recent/future hospitalizations: no    Any changes in medication regimen? no    Recent/future dental: no  Any missed doses?: no       Is patient compliant with meds? yes       Allergies: 1)  ! Diltiazem Hcl Er Beads 2)  ! Amiodarone Hcl 3)  ! * Purinethol 4)  ! * Metronidazole 5)  ! * Remicade 6)  ! * Asacol 7)  ! Codeine 8)  ! * Pyridostigmine 9)  ! Methotrexate  Anticoagulation Management History:      The patient is taking warfarin and comes in today for a routine follow up visit.  Positive risk factors for bleeding include an age of 70 years or older.  The bleeding index is 'intermediate risk'.  Positive CHADS2 values include History of HTN.  Negative CHADS2 values include Age > 15 years old.  The start date was 12/18/2006.  His last INR was 2.5 ratio.  Anticoagulation responsible provider: Shirlee Latch MD, Clovis Warwick.  INR POC: 2.4.  Cuvette Lot#: [id number].  Exp: 09/2011.    Anticoagulation Management Assessment/Plan:      The patient's current anticoagulation dose is Warfarin sodium 5 mg tabs: Take as directed by coumadin clinic..  The target INR is 2 - 3.  The next INR is due 09/19/2010.  Anticoagulation instructions were given to patient.  Results were reviewed/authorized by Cloyde Reams, RN, BSN.  He was notified by Cloyde Reams RN.         Prior Anticoagulation Instructions: INR 2.3 Continue 5mg s daily except 7.5mg s on Wednesdays and Saturdays.  Recheck in 4 weeks.   Current Anticoagulation Instructions: INR 2.4  Continue on same  dosage 5mg  daily except 7.5mg  on Wednesdays and Saturdays.  Recheck in 5 weeks.

## 2010-09-19 ENCOUNTER — Encounter (INDEPENDENT_AMBULATORY_CARE_PROVIDER_SITE_OTHER): Payer: Medicare Other

## 2010-09-19 ENCOUNTER — Encounter: Payer: Self-pay | Admitting: Cardiovascular Disease

## 2010-09-19 DIAGNOSIS — I4891 Unspecified atrial fibrillation: Secondary | ICD-10-CM

## 2010-09-19 DIAGNOSIS — Z7901 Long term (current) use of anticoagulants: Secondary | ICD-10-CM

## 2010-09-27 NOTE — Medication Information (Signed)
Summary: Coumadin Clinic  Anticoagulant Therapy  Managed by: Cloyde Reams, RN, BSN Referring MD: Willa Rough MD PCP: Julieanne Manson [name] Supervising MD: Mariah Milling Indication 1: Atrial Fibrillation (ICD-427.31) Indication 2: Pulmonary Embolism and Infarction (ICD-415.1) Lab Used: [facility] Site: [facility] INR POC 4.8 INR RANGE 2 - 3  Dietary changes: no    Health status changes: yes       Details: pt had cold recently, now resolved.  Bleeding/hemorrhagic complications: no    Recent/future hospitalizations: no    Any changes in medication regimen? yes       Details: took tylenol cold/sinus last week, now off of medication.  Recent/future dental: no  Any missed doses?: no       Is patient compliant with meds? yes       Allergies: 1)  ! Diltiazem Hcl Er Beads 2)  ! Amiodarone Hcl 3)  ! * Purinethol 4)  ! * Metronidazole 5)  ! * Remicade 6)  ! * Asacol 7)  ! Codeine 8)  ! * Pyridostigmine 9)  ! Methotrexate  Anticoagulation Management History:      The patient is taking warfarin and comes in today for a routine follow up visit.  Positive risk factors for bleeding include an age of 70 years or older.  The bleeding index is 'intermediate risk'.  Positive CHADS2 values include History of HTN.  Negative CHADS2 values include Age > 73 years old.  The start date was 12/18/2006.  His last INR was 2.5 ratio.  Anticoagulation responsible provider: Gollan.  INR POC: 4.8.  Cuvette Lot#: [id number].  Exp: 09/2011.    Anticoagulation Management Assessment/Plan:      The patient's current anticoagulation dose is Warfarin sodium 5 mg tabs: Take as directed by coumadin clinic..  The target INR is 2 - 3.  The next INR is due 10/03/2010.  Anticoagulation instructions were given to patient.  Results were reviewed/authorized by Cloyde Reams, RN, BSN.  He was notified by Cloyde Reams, RN, BSN.         Prior Anticoagulation Instructions: INR 2.4  Continue on same dosage 5mg  daily except  7.5mg  on Wednesdays and Saturdays.  Recheck in 5 weeks.    Current Anticoagulation Instructions: INR 4.8  Skip today and tomorrow's dosages of Coumadin, then resume same dosage 1 tablet daily except 1.5 tablets on Wednesdays and Saturdays.  Recheck in 2 weeks.

## 2010-10-03 ENCOUNTER — Encounter: Payer: Self-pay | Admitting: Cardiovascular Disease

## 2010-10-03 ENCOUNTER — Encounter (INDEPENDENT_AMBULATORY_CARE_PROVIDER_SITE_OTHER): Payer: Medicare Other

## 2010-10-03 DIAGNOSIS — I4891 Unspecified atrial fibrillation: Secondary | ICD-10-CM

## 2010-10-03 DIAGNOSIS — Z7901 Long term (current) use of anticoagulants: Secondary | ICD-10-CM

## 2010-10-09 NOTE — Medication Information (Signed)
Summary: rov/ewj  Anticoagulant Therapy  Managed by: Cloyde Reams, RN, BSN Referring MD: Willa Rough MD PCP: Julieanne Manson [name] Supervising MD: Mariah Milling Indication 1: Atrial Fibrillation (ICD-427.31) Indication 2: Pulmonary Embolism and Infarction (ICD-415.1) Lab Used: [facility] Site: [facility] INR POC 3.7 INR RANGE 2 - 3  Dietary changes: no    Health status changes: no    Bleeding/hemorrhagic complications: no    Recent/future hospitalizations: no    Any changes in medication regimen? no    Recent/future dental: no  Any missed doses?: no       Is patient compliant with meds? yes       Allergies: 1)  ! Diltiazem Hcl Er Beads 2)  ! Amiodarone Hcl 3)  ! * Purinethol 4)  ! * Metronidazole 5)  ! * Remicade 6)  ! * Asacol 7)  ! Codeine 8)  ! * Pyridostigmine 9)  ! Methotrexate  Anticoagulation Management History:      The patient is taking warfarin and comes in today for a routine follow up visit.  Positive risk factors for bleeding include an age of 7 years or older.  The bleeding index is 'intermediate risk'.  Positive CHADS2 values include History of HTN.  Negative CHADS2 values include Age > 44 years old.  The start date was 12/18/2006.  His last INR was 2.5 ratio.  Anticoagulation responsible provider: Kaitlyne Friedhoff.  INR POC: 3.7.  Cuvette Lot#: [id number].  Exp: 08/2011.    Anticoagulation Management Assessment/Plan:      The patient's current anticoagulation dose is Warfarin sodium 5 mg tabs: Take as directed by coumadin clinic..  The target INR is 2 - 3.  The next INR is due 10/17/2010.  Anticoagulation instructions were given to patient.  Results were reviewed/authorized by Cloyde Reams, RN, BSN.  He was notified by Cloyde Reams RN.         Prior Anticoagulation Instructions: INR 4.8  Skip today and tomorrow's dosages of Coumadin, then resume same dosage 1 tablet daily except 1.5 tablets on Wednesdays and Saturdays.  Recheck in 2 weeks.     Current  Anticoagulation Instructions: INR 3.7  Skip today's dosage of Coumadin, then start taking 1tablet daily except 1.5 tablets on Wednesday.  Recheck in 2 weeks.

## 2010-10-17 ENCOUNTER — Encounter (INDEPENDENT_AMBULATORY_CARE_PROVIDER_SITE_OTHER): Payer: Medicare Other

## 2010-10-17 ENCOUNTER — Encounter: Payer: Self-pay | Admitting: Internal Medicine

## 2010-10-17 DIAGNOSIS — Z7901 Long term (current) use of anticoagulants: Secondary | ICD-10-CM

## 2010-10-17 DIAGNOSIS — I2699 Other pulmonary embolism without acute cor pulmonale: Secondary | ICD-10-CM

## 2010-10-17 DIAGNOSIS — I4891 Unspecified atrial fibrillation: Secondary | ICD-10-CM

## 2010-10-23 NOTE — Medication Information (Signed)
Summary: rov/ewj  Anticoagulant Therapy  Managed by: Bethena Midget, RN, BSN Referring MD: Willa Rough MD PCP: Julieanne Manson [name] Supervising MD: Gala Romney MD, Reuel Boom Indication 1: Atrial Fibrillation (ICD-427.31) Indication 2: Pulmonary Embolism and Infarction (ICD-415.1) Lab Used: [facility] Site: [facility] INR POC 4.2 INR RANGE 2 - 3  Dietary changes: no    Health status changes: yes       Details: SOB for past 6 mths, sees Dr Myrtis Ser next week  Bleeding/hemorrhagic complications: no    Recent/future hospitalizations: no    Any changes in medication regimen? yes       Details: Taking prednisone started on Monday 30mg s, Tues 30mg s and 20mg  today and the humira injection today.   Recent/future dental: no  Any missed doses?: no       Is patient compliant with meds? yes      Comments: Prednisone week of humira inj is not new this has been occuring since 2006.   Allergies: 1)  ! Diltiazem Hcl Er Beads 2)  ! Amiodarone Hcl 3)  ! * Purinethol 4)  ! * Metronidazole 5)  ! * Remicade 6)  ! * Asacol 7)  ! Codeine 8)  ! * Pyridostigmine 9)  ! Methotrexate  Anticoagulation Management History:      The patient is taking warfarin and comes in today for a routine follow up visit.  Positive risk factors for bleeding include an age of 30 years or older.  The bleeding index is 'intermediate risk'.  Positive CHADS2 values include History of HTN.  Negative CHADS2 values include Age > 56 years old.  The start date was 12/18/2006.  His last INR was 2.5 ratio.  Anticoagulation responsible provider: Bensimhon MD, Reuel Boom.  INR POC: 4.2.  Cuvette Lot#: [id number].  Exp: 08/2011.    Anticoagulation Management Assessment/Plan:      The patient's current anticoagulation dose is Warfarin sodium 5 mg tabs: Take as directed by coumadin clinic..  The target INR is 2 - 3.  The next INR is due 10/31/2010.  Anticoagulation instructions were given to patient.  Results were reviewed/authorized by Bethena Midget, RN, BSN.  He was notified by Bethena Midget, RN, BSN.         Prior Anticoagulation Instructions: INR 3.7  Skip today's dosage of Coumadin, then start taking 1tablet daily except 1.5 tablets on Wednesday.  Recheck in 2 weeks.    Current Anticoagulation Instructions: INR 4.2 Skip today's dose then change dose to 5mg s everyday, Recheck in 2 weeks.

## 2010-10-25 ENCOUNTER — Telehealth: Payer: Self-pay | Admitting: Internal Medicine

## 2010-10-25 ENCOUNTER — Encounter: Payer: Self-pay | Admitting: Cardiology

## 2010-10-25 ENCOUNTER — Ambulatory Visit (INDEPENDENT_AMBULATORY_CARE_PROVIDER_SITE_OTHER): Payer: Medicare Other | Admitting: Cardiology

## 2010-10-25 DIAGNOSIS — R0602 Shortness of breath: Secondary | ICD-10-CM | POA: Insufficient documentation

## 2010-10-25 DIAGNOSIS — I10 Essential (primary) hypertension: Secondary | ICD-10-CM

## 2010-10-27 ENCOUNTER — Encounter: Payer: Self-pay | Admitting: Cardiology

## 2010-10-27 DIAGNOSIS — Z7901 Long term (current) use of anticoagulants: Secondary | ICD-10-CM

## 2010-10-27 DIAGNOSIS — I2699 Other pulmonary embolism without acute cor pulmonale: Secondary | ICD-10-CM

## 2010-10-30 NOTE — Progress Notes (Signed)
Summary: rx refill  Phone Note Refill Request Message from:  Patient on October 25, 2010 2:25 PM  Refills Requested: Medication #1:  NITRO-DUR 0.4 MG/HR PT24 PRN pt wants nitro-dur tablets not patches   Method Requested: Telephone to Pharmacy Initial call taken by: Roe Coombs,  October 25, 2010 2:26 PM    New/Updated Medications: NITROSTAT 0.4 MG SUBL (NITROGLYCERIN) 1 tablet under tongue at onset of chest pain; you may repeat every 5 minutes for up to 3 doses. Prescriptions: NITROSTAT 0.4 MG SUBL (NITROGLYCERIN) 1 tablet under tongue at onset of chest pain; you may repeat every 5 minutes for up to 3 doses.  #25 x 6   Entered by:   Hardin Negus, RMA   Authorized by:   Talitha Givens, MD, [facility]   Signed by:   Hardin Negus, RMA on 10/25/2010   Method used:   [facility] to        [facility] [phone]* (retail)       [address] [place], [place]  [address]       Ph: [phone] or [phone]       Fax: [phone]   RxID:   [id number]

## 2010-10-30 NOTE — Assessment & Plan Note (Signed)
Summary: 1 yr follow up   Visit Type:  Follow-up Primary Provider:  Julieanne Manson [name]  CC:  shortness of breath.  History of Present Illness: The patient is seen for overall cardiology followup.  He has complex cardiac issues but they have been stable.  I saw him last March, 2011. he has coronary disease post CABG 2008.  Relook catheterization in 2010 revealed that all 5 grafts were patent.  Is not having any chest pain.  He has developed some exertional shortness of breath.  There is no PND or orthopnea.  He has gained some true body weight.  Also his blood pressure at home seems to be higher than usual.  He had some problems with orthostatic hypotension and we had stopped his ACE inhibitor in the past.  Current Medications (verified): 1)  Warfarin Sodium 5 Mg Tabs (Warfarin Sodium) .... Take As Directed By Coumadin Clinic. 2)  Centrum Silver  Tabs (Multiple Vitamins-Minerals) .... Once Daily 3)  Fish Oil   Oil (Fish Oil) .... 2 Once Daily 4)  Calcium Carbonate-Vitamin D 600-400 Mg-Unit  Tabs (Calcium Carbonate-Vitamin D) .... Once Daily 5)  Simvastatin 40 Mg Tabs (Simvastatin) .... Take One Tablet By Mouth Daily At Bedtime 6)  Omeprazole 20 Mg Cpdr (Omeprazole) .... Once Daily 7)  Metoprolol Succinate 25 Mg Xr24h-Tab (Metoprolol Succinate) .... Take One Tablet By Mouth Daily 8)  Folic Acid   1mg  .... 1 Once Daily 9)  Aspirin 81 Mg  Tabs (Aspirin) .... Take One Tablet By Mouth Once Daily. 10)  Vitamin B-12 Cr 1000 Mcg Cr-Tabs (Cyanocobalamin) .... Q Month 11)  Vitamin D 1000 Unit  Tabs (Cholecalciferol) .... Once Daily 12)  Nitro-Dur 0.4 Mg/hr Pt24 (Nitroglycerin) .... Prn 13)  Docusate Sodium 100 Mg Tabs (Docusate Sodium) .... Prn 14)  Claritin 10 Mg Tabs (Loratadine) .... 2 Hrs Before Humira 15)  Zantab 150mg  .... 2 Hrs Before Humira 16)  Dramamine 50 Mg Tabs (Dimenhydrinate) .... Prn 17)  Humira Pen 40 Mg/0.38ml Kit (Adalimumab) .... Uad 18)  Prednisone 20 Mg Tabs (Prednisone) ....  Uad 19)  Fluticasone Propionate 50 Mcg/act Susp (Fluticasone Propionate) .... Uad 20)  Lovenox 150 Mg/ml Soln (Enoxaparin Sodium) .... Inject 1 Syringe Subcutaneously Daily As Directed By Coumadin Clinic 21)  Butalbital-Aspirin-Caffeine 50-325-40 Mg Tabs (Butalbital-Aspirin-Caffeine) .Marland Kitchen.. 1-2 Caps As Needed  Allergies (verified): 1)  ! Diltiazem Hcl Er Beads 2)  ! Amiodarone Hcl 3)  ! * Purinethol 4)  ! * Metronidazole 5)  ! * Remicade 6)  ! * Asacol 7)  ! Codeine 8)  ! * Pyridostigmine 9)  ! Methotrexate  Past History:  Past Medical History:  History of syncope. Evaluation by Dr. Graciela Husbands: Syncope probably neurally mediated and modest resting sinus bradycardia. Monitor her revealed sinus bradycardia and 16 B. episode of supraventricular tachycardia that was either atrial fibrillation or another type of SVT. Patient improved with combination of holding ACE inhibitor or low blood pressure and reducing beta blocker for bradycardia. Crohn's disease treated history of orthostasis History supraventricular tachycardia  Hypertension Anemia related to Crohn's disease Intermittent steroid use as needed Status post cholecystectomy History of colon tumor Pulmonary embolus 2007 documented by CT. IVC filter placed because of concern for Coumadin use at that time. History of IVC filter Coumadin therapy.. Question sleep apnea EF  55-60%  ...echo... April, 2009 Mitral regurgitation.... mild.... echo.... 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 This is additional  reason for Coumadin. CAD... relook catheter July 1,2010= all 5 grafts are patent. Status post CABG x5 in April 2008 IRBBB History of mild anxiety Question of rash from diltiazem Gait difficulty with Crestor(but tolerates simvastatin) Allergies: Metronidazole, Asacol, Remicade, PURINETHOL, codeine, question rash from amiodarone and  diltiazem.  Review of Systems       Patient denies fever, chills, headache, sweats, rash, change in vision, change in hearing, chest pain, cough, nausea vomiting, urinary symptoms.  All other systems are reviewed and are negative.  Vital Signs:  Patient profile:   70 year old male Weight:      210 pounds BMI:     29.39 Pulse rate:   56 / minute BP sitting:   157 / 96  (right arm)  Vitals Entered By: Meredith Staggers, RN (October 25, 2010 10:29 AM)  Physical Exam  General:  patient is quite stable today. Head:  head is atraumatic. Eyes:  no xanthelasma. Neck:  no jugular venous distention. Chest Wall:  no chest wall tenderness. Lungs:  lungs are clear.  Respiratory effort is nonlabored. Heart:  cardiac exam reveals S1 and S2.  No clicks or significant murmurs. Abdomen:  abdomen is soft. Msk:  no musculoskeletal deformities. Extremities:  no peripheral edema. Skin:  there is a slight blush to the patient's skin color in the face.  This is old Psych:  patient is oriented to person time and place.  He is here with his wife.   Impression & Recommendations:  Problem # 1:  * IRBBB EKG done today and reviewed by me.  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  Problem # 2:  * COUMADIN RX patient continues on Coumadin.  No change in therapy.  Problem # 3:  HYPERTENSION, BENIGN (ICD-401.1) the patient had orthostatic hypotension and we had stopped his ACE inhibitor.  He had no recurrent symptoms.  He has now had some return of hypertension.  His blood pressure has been elevated on a regular basis at home.  Will start very low-dose of Cozaar.  I will then see him for followup.  Problem # 4:  CAD (ICD-414.00) coronary disease appears to be stable.  I am not convinced that his current shortness of breath is anginal in etiology.  Problem # 5:  SHORTNESS OF BREATH (ICD-786.05) At this point some of the shortness of breath may be from weight gain.  He does not  appear to be volume overloaded.  I am not convinced that this is ischemia.  Two-dimensional echo will be done to reassess LV function.  I am seeing back for followup.  Other Orders: EKG w/ Interpretation (93000)  Appended Document: orders and instructions    Clinical Lists Changes  Medications: Added new medication of LOSARTAN POTASSIUM 50 MG TABS (LOSARTAN POTASSIUM) Take 1 tablet by mouth once a day - Signed Rx of LOSARTAN POTASSIUM 50 MG TABS (LOSARTAN POTASSIUM) Take 1 tablet by mouth once a day;  #30 x 6;  Signed;  Entered by: Meredith Staggers, RN;  Authorized by: Talitha Givens, MD, [facility];  Method used: [facility] to [facility] [phone]*, [address] [place], [place], [place]  [address], Ph: [phone] or [phone], Fax: [phone] Rx of NITRO-DUR 0.4 MG/HR PT24 (NITROGLYCERIN) PRN;  #25 x 3;  Signed;  Entered by: Meredith Staggers, RN;  Authorized by: Talitha Givens, MD, [facility];  Method used: Electronically to [facility]. [phone]*, [address] [place], [place],  [place], [place]  [address], Ph: [phone] or [phone], Fax: [phone] Orders: Added new Service order of EKG w/ Interpretation (93000) - Signed Added new Referral order of Echocardiogram (Echo) - Signed Observations: Added new observation of PI CARDIO: Start Losartan 50mg  daily Your physician has requested that you have an echocardiogram.  Echocardiography is a painless test that uses sound waves to create images of your heart. It provides your doctor with information about the size and shape of your heart and how well your heart's chambers and valves are working.  This procedure takes approximately one hour. There are no restrictions for this procedure. Follow up in 5 weeks (10/25/2010 10:55)    Prescriptions: NITRO-DUR 0.4 MG/HR PT24 (NITROGLYCERIN) PRN  #25 x 3   Entered by:   Meredith Staggers, RN   Authorized by:   Talitha Givens, MD, [facility]   Signed by:   Meredith Staggers, RN on 10/25/2010   Method  used:   Electronically to        [facility] [phone]* (retail)       [address] [place], [place]  [address]       Ph: [phone] or [phone]       Fax: [phone]   RxID:   [id number] LOSARTAN POTASSIUM 50 MG TABS (LOSARTAN POTASSIUM) Take 1 tablet by mouth once a day  #30 x 6   Entered by:   Meredith Staggers, RN   Authorized by:   Talitha Givens, MD, [facility]   Signed by:   Meredith Staggers, RN on 10/25/2010   Method used:   Electronically to        [facility] [phone]* (retail)       [address]       [place], [place]  [address]       Ph: [phone] or [phone]       Fax: [phone]   RxID:   [id number]     Patient Instructions: 1)  Start Losartan 50mg  daily 2)  Your physician has requested that you have an echocardiogram.  Echocardiography is a painless test that uses sound waves to create images of your heart. It provides your doctor with information about the size and shape of your heart and how well your heart's chambers and valves are working.  This procedure takes approximately one hour. There are no restrictions for this procedure. 3)  Follow up in 5 weeks

## 2010-10-31 ENCOUNTER — Ambulatory Visit (INDEPENDENT_AMBULATORY_CARE_PROVIDER_SITE_OTHER): Payer: Medicare Other | Admitting: Emergency Medicine

## 2010-10-31 DIAGNOSIS — I2699 Other pulmonary embolism without acute cor pulmonale: Secondary | ICD-10-CM

## 2010-10-31 DIAGNOSIS — Z7901 Long term (current) use of anticoagulants: Secondary | ICD-10-CM

## 2010-10-31 NOTE — Patient Instructions (Signed)
Skip today's dosage of Coumadin, then resume same dosage 5mg  daily.  Recheck in 4 weeks.

## 2010-11-06 ENCOUNTER — Other Ambulatory Visit (HOSPITAL_COMMUNITY): Payer: Self-pay | Admitting: Cardiology

## 2010-11-06 ENCOUNTER — Ambulatory Visit (HOSPITAL_COMMUNITY): Payer: Medicare Other | Attending: Cardiology

## 2010-11-06 DIAGNOSIS — R0600 Dyspnea, unspecified: Secondary | ICD-10-CM

## 2010-11-06 DIAGNOSIS — Z86711 Personal history of pulmonary embolism: Secondary | ICD-10-CM | POA: Insufficient documentation

## 2010-11-06 DIAGNOSIS — R0989 Other specified symptoms and signs involving the circulatory and respiratory systems: Secondary | ICD-10-CM | POA: Insufficient documentation

## 2010-11-06 DIAGNOSIS — R55 Syncope and collapse: Secondary | ICD-10-CM | POA: Insufficient documentation

## 2010-11-06 DIAGNOSIS — I498 Other specified cardiac arrhythmias: Secondary | ICD-10-CM | POA: Insufficient documentation

## 2010-11-06 DIAGNOSIS — I10 Essential (primary) hypertension: Secondary | ICD-10-CM | POA: Insufficient documentation

## 2010-11-06 DIAGNOSIS — I059 Rheumatic mitral valve disease, unspecified: Secondary | ICD-10-CM | POA: Insufficient documentation

## 2010-11-06 DIAGNOSIS — R0609 Other forms of dyspnea: Secondary | ICD-10-CM | POA: Insufficient documentation

## 2010-11-12 ENCOUNTER — Telehealth: Payer: Self-pay | Admitting: Cardiology

## 2010-11-12 NOTE — Telephone Encounter (Signed)
Pt rtn call 

## 2010-11-12 NOTE — Telephone Encounter (Signed)
Patient aware.

## 2010-11-23 ENCOUNTER — Encounter: Payer: Self-pay | Admitting: Cardiology

## 2010-11-26 ENCOUNTER — Encounter: Payer: Self-pay | Admitting: Cardiology

## 2010-11-26 DIAGNOSIS — Z95828 Presence of other vascular implants and grafts: Secondary | ICD-10-CM | POA: Insufficient documentation

## 2010-11-26 DIAGNOSIS — I4510 Unspecified right bundle-branch block: Secondary | ICD-10-CM | POA: Insufficient documentation

## 2010-11-26 DIAGNOSIS — D66 Hereditary factor VIII deficiency: Secondary | ICD-10-CM | POA: Insufficient documentation

## 2010-11-26 DIAGNOSIS — Z951 Presence of aortocoronary bypass graft: Secondary | ICD-10-CM | POA: Insufficient documentation

## 2010-11-26 DIAGNOSIS — I10 Essential (primary) hypertension: Secondary | ICD-10-CM | POA: Insufficient documentation

## 2010-11-26 DIAGNOSIS — I2699 Other pulmonary embolism without acute cor pulmonale: Secondary | ICD-10-CM | POA: Insufficient documentation

## 2010-11-26 DIAGNOSIS — I340 Nonrheumatic mitral (valve) insufficiency: Secondary | ICD-10-CM | POA: Insufficient documentation

## 2010-11-26 DIAGNOSIS — I2510 Atherosclerotic heart disease of native coronary artery without angina pectoris: Secondary | ICD-10-CM | POA: Insufficient documentation

## 2010-11-26 DIAGNOSIS — Z7901 Long term (current) use of anticoagulants: Secondary | ICD-10-CM | POA: Insufficient documentation

## 2010-11-26 DIAGNOSIS — R55 Syncope and collapse: Secondary | ICD-10-CM | POA: Insufficient documentation

## 2010-11-26 DIAGNOSIS — I951 Orthostatic hypotension: Secondary | ICD-10-CM | POA: Insufficient documentation

## 2010-11-26 DIAGNOSIS — Z79899 Other long term (current) drug therapy: Secondary | ICD-10-CM | POA: Insufficient documentation

## 2010-11-27 ENCOUNTER — Encounter: Payer: Self-pay | Admitting: Cardiology

## 2010-11-27 ENCOUNTER — Ambulatory Visit (INDEPENDENT_AMBULATORY_CARE_PROVIDER_SITE_OTHER): Payer: Medicare Other | Admitting: Cardiology

## 2010-11-27 ENCOUNTER — Ambulatory Visit (INDEPENDENT_AMBULATORY_CARE_PROVIDER_SITE_OTHER): Payer: Medicare Other | Admitting: *Deleted

## 2010-11-27 DIAGNOSIS — R0602 Shortness of breath: Secondary | ICD-10-CM

## 2010-11-27 DIAGNOSIS — I2699 Other pulmonary embolism without acute cor pulmonale: Secondary | ICD-10-CM

## 2010-11-27 DIAGNOSIS — I2510 Atherosclerotic heart disease of native coronary artery without angina pectoris: Secondary | ICD-10-CM

## 2010-11-27 DIAGNOSIS — I951 Orthostatic hypotension: Secondary | ICD-10-CM

## 2010-11-27 DIAGNOSIS — Z7901 Long term (current) use of anticoagulants: Secondary | ICD-10-CM

## 2010-11-27 NOTE — Progress Notes (Signed)
HPI Patient returns for follow up assessment of shortness of breath.  This remained stable.  He's not having chest pain.  He is not limited by shortness of breath.  There is no edema.  I had seen him last on October 30, 2010.  Decision was made to do a 2-D echo at that time.  Study was done showing an ejection fraction of 60-65%.  There is good RV function.  Overall this is a good result.  Allergies  Allergen Reactions  . Amiodarone Hcl   . Codeine   . Diltiazem Hcl   . Infliximab   . Mercaptopurine   . Mesalamine   . Methotrexate   . Metronidazole     Current Outpatient Prescriptions  Medication Sig Dispense Refill  . adalimumab (HUMIRA PEN) 40 MG/0.8ML injection Inject 40 mg into the skin. Every two weeks      . aspirin 81 MG tablet Take 81 mg by mouth daily.        . butalbital-aspirin-caffeine (FIORINAL) 50-325-40 MG per capsule Take 1 capsule by mouth every 4 (four) hours as needed.        . Calcium Carbonate-Vit D-Min 600-400 MG-UNIT TABS Take 1 tablet by mouth daily.        . cholecalciferol (VITAMIN D) 1000 UNITS tablet Take 1,000 Units by mouth daily.        Marland Kitchen dimenhyDRINATE (DRAMAMINE) 50 MG tablet Take 50 mg by mouth every 8 (eight) hours as needed.        . docusate sodium (COLACE) 100 MG capsule Take 100 mg by mouth as needed.        . fish oil-omega-3 fatty acids 1000 MG capsule Take 2 g by mouth daily.        . fluticasone (FLONASE) 50 MCG/ACT nasal spray 2 sprays by Nasal route daily.        . folic acid (FOLVITE) 1 MG tablet Take 1 tablet by mouth daily.       Marland Kitchen loratadine (CLARITIN) 10 MG tablet 2 hrs before Humira       . losartan (COZAAR) 50 MG tablet Take 1 tablet by mouth daily.      . metoprolol succinate (TOPROL-XL) 25 MG 24 hr tablet Take 1 tablet by mouth daily.      . Multiple Vitamin (MULTIVITAMIN) tablet Take 1 tablet by mouth daily.        Marland Kitchen NITROSTAT 0.4 MG SL tablet Take 1 tablet by mouth as directed.      Marland Kitchen omeprazole (PRILOSEC) 20 MG capsule Take 1  tablet by mouth daily.      . predniSONE (DELTASONE) 20 MG tablet Take 1 tablet by mouth as directed.      . ranitidine (ZANTAC) 150 MG tablet 2 hrs before Humira       . simvastatin (ZOCOR) 40 MG tablet Take 1 tablet by mouth daily.      . vitamin B-12 (CYANOCOBALAMIN) 1000 MCG tablet Take 1,000 mcg by mouth every 30 (thirty) days.        Marland Kitchen warfarin (COUMADIN) 5 MG tablet Take by mouth as directed.          History   Social History  . Marital Status: Married    Spouse Name: N/A    Number of Children: N/A  . Years of Education: N/A   Occupational History  . retired    Social History Main Topics  . Smoking status: Former Smoker    Quit date: 08/12/1969  . Smokeless tobacco:  Not on file  . Alcohol Use: No  . Drug Use: No  . Sexually Active: Not on file   Other Topics Concern  . Not on file   Social History Narrative  . No narrative on file    Family History  Problem Relation Age of Onset  . Coronary artery disease      Past Medical History  Diagnosis Date  . Syncope and collapse     Evaluation by Dr Turner Daniels; Syncope probably neutrally mediated and modest resting sinus bradycardia. and 16 B. episode of SVT that was either A fib or another type of SVT. Patient improved with combination of holding ACE inhibitor or low BP and reducing beta blocker for bradycardia  . Hypertension                                                                                                                                                                                                                                                                                                                                                                                                                       . Coronary artery disease     relook catheter 08/12/08 - all 5 grafts re pathend  . Crohn's disease     Humara Rx in past  . SVT (supraventricular tachycardia)   . 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 History of benign colon tumor   . Sleep apnea     questionable  . Mitral  regurgitation     mild - echo 4/09  . Anxiety   . Gait difficulty     with Crestor (but tolerates simva)  . Pulmonary embolism     2007,b CT scan, IVC fiter placed then because of concern about coumadin use at that time.  . Orthostasis     treated  . Drug therapy     Intermittant steroids   . S/P IVC filter     2007  PE  . Warfarin anticoagulation   . Factor VIII     Factor VIII EXCESS.Marland KitchenMarland KitchenDr Hampton Abbot.Marland Kitchenanother reason for coumadin  . Hx of CABG     2008  . Incomplete RBBB   . Drug therapy     question rash diltiazem, and amio  . Gait abnormality     gait difficulty with crestor, OK with Simva  . Ejection fraction     EF 60-65%, echo, March, 2012, moderate diastolic dysfunction    Past Surgical History  Procedure Date  . Coronary artery bypass graft 4/08  . Cholecystectomy   . Back surgery     x2  . Cataract extraction     x2  . Colonoscopy w/ polypectomy     ROS  Patient denies fever, chills, headache, sweats, rash, change in vision, change in hearing,Chest pain, cough, nausea vomiting, urinary symptoms.  All other systems are reviewed and are negative.  PHYSICAL EXAM Patient is here with his wife today.  He is oriented to person time and place.  Affect is normal.  There is no xanthelasma.  There is no jugular venous distention.  Lungs are clear.  Respiratory effort is unlabored.  Cardiac exam reveals S1-S2.  No clicks or significant murmurs.  The abdomen is soft there is no peripheral edema.  Filed Vitals:   11/27/10 1021  BP: 130/70  Pulse: 60  Resp: 18  Height: 5\' 10"  (1.778 m)  Weight: 211 lb (95.709 kg)    EKG  Not done today.  ASSESSMENT & PLAN

## 2010-11-27 NOTE — Assessment & Plan Note (Signed)
Coronary disease is stable. No change in therapy. 

## 2010-11-27 NOTE — Patient Instructions (Signed)
Your physician wants you to follow-up in:  6 months. You will receive a reminder letter in the mail two months in advance. If you don't receive a letter, please call our office to schedule the follow-up appointment.   

## 2010-11-27 NOTE — Assessment & Plan Note (Signed)
Shortness of breath is stable.  There is good LV function.  No further workup.  He does have some diastolic dysfunction.  He is not volume overloaded.  I'm not changing his medicines.

## 2010-11-27 NOTE — Assessment & Plan Note (Signed)
He's not having any orthostatic changes.  No change in therapy.  Six-month followup.

## 2010-11-28 ENCOUNTER — Encounter: Payer: Medicare Other | Admitting: Emergency Medicine

## 2010-12-17 ENCOUNTER — Other Ambulatory Visit: Payer: Self-pay | Admitting: Cardiology

## 2010-12-25 NOTE — Assessment & Plan Note (Signed)
[facility]                            CARDIOLOGY OFFICE NOTE   NAME:[name] [name], [name] [name]                         MRN:          [id number]  DATE:12/30/2007                            DOB:          1941/08/04    ADDENDUM:  I have now reviewed the monitor strips from the monitors that  [name] has been wearing with his [employer] event recorder.  He wore it  from April 9 to November 30, 2007.  During that time he had normal sinus  rhythm and sinus bradycardia.  He did have symptoms on various  occasions.  With each of these symptoms he had the same sinus  bradycardia that he had in general and that was unrevealing.  He did  have one episode of supraventricular tachycardia that was picked up with  an automatic evaluation.  There were 16 beats.  The signal is not clear.  This could be a burst of atrial fibrillation or another supraventricular  arrhythmia.  It is documented in the chart dated November 21, 2006.  The  patient has then been wearing a monitor that monitors his heart rate  continuously.  He continues to have sinus rhythm with sinus bradycardia.   As noted in the dictation done earlier today he is stable and feeling  better with his blood pressure medicines reduced.  At this point I  cannot add any further information with the monitor strips as outlined  above.     Luis Abed, MD, [facility]     JDK/MedQ  DD: 12/30/2007  DT: 12/30/2007  Job #: [id number]

## 2010-12-25 NOTE — Assessment & Plan Note (Signed)
[facility]                            CARDIOLOGY OFFICE NOTE   NAME:[name], [name] STAYER                         MRN:          [id number]  DATE:11/19/2007                            DOB:          07/25/1941    [name] [name] is seen for a followup.  He continues to have exertional  shortness of breath.  He also has some palpitations.  When I saw him  last, we decided to proceed with a 2-D echo.  Fortunately, his LV  function is good, and there are no major valvular abnormalities.  I  still do not have the answer as to why he is not feeling as well as he  did originally after surgery.  He certainly is on many medications.  He  is not having any syncope or pre-syncope.  He has no diagnostic chest  pain.   PAST MEDICAL HISTORY:  Allergies:  Metronidazole, Asacol, Remicade,  Purinethol, codeine, question of rash from amiodarone and question of a  rash from diltiazem.   MEDICATIONS:  Clonazepam, vitamin B injections monthly, vitamins.  Fish  oil, calcium, Coumadin, folic acid, lisinopril, aspirin 81, prednisone  for 48 hours prior to his Humira shot, Prilosec, simvastatin,  metoprolol, Humira injection monthly, Allegra and Benadryl.   OTHER MEDICAL PROBLEMS:  See the complete list below.   REVIEW OF SYSTEMS:  Other than the HPI, his review of systems is  negative.   PHYSICAL EXAM:  He is here with his wife today.  VITAL SIGNS:  Blood pressure is 140/84.  Pulse 55.  Weight is 194  pounds, which is stable.  HEENT:  Reveals no xanthelasma.  There is normal extraocular motion.  GENERAL:  The patient is oriented to person, time and place, and his  affect is normal.  NECK:  There are no carotid bruits.  There is no jugular venous  distention.  LUNGS:  Clear.  Respiratory effort is not labored at rest.  CARDIAC:  Exam reveals an S1 with an S2.  There are no clicks or  significant murmurs.  ABDOMEN:  His abdomen is soft.  He has no significant peripheral edema.   PROBLEMS:  Include:  1. History of syncope on several occasions the past.  We thought this      may have been related to his episodes of Crohn's or orthostasis      from Remicade.  He does have a history of pulmonary embolus.  We do      not know if this was related.  In addition, we do not if his      coronary disease had played a role.  2. History of Crohn disease, significant ongoing problem that is being      treated.  3. History of orthostasis, stable.  4. Brief episodes of supraventricular tachycardia in the past.  5. History of hypertension.  6. History of anemia, Crohn's disease.  7. Steroid use.  8. Status post cholecystectomy.  9. History of a colon tumor.  10.Shortness of breath.  This seemed to have improved.  At one time in  the past, we thought that volume might be playing a role.  This had      improved, but now he has the shortness of breath again.  He does      not appear to be volume overloaded.  11.Episode in 2007 with a documented pulmonary embolus by CT scan.  He      had an IVC filter placed at that time, because of fear that he      would not be able to take long-term Coumadin, but ultimately he      could take Coumadin.  It also is of note that he had a chest CT      showing his pulmonary emboli and that he did have deep venous      thrombosis in April 2007.  12.Ongoing Coumadin therapy.  13.Question of sleep apnea.  14.Ejection fraction 60%, with a recent check in the same range.  15.Treatment with Humira for Crohn's disease.  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  This is an additional reason for his Coumadin.  17.Coronary disease post CABG x5 in April 2008.  18.Postoperative atrial fibrillation that resolved.  19.Amiodarone used for question of a rash, and it was stopped.  20.History in the past of also a combination of Cipro and Flagyl and      that having problems from these, but he received Cipro without       problem.  21.History of some anxiety.  22.Question of a rash from diltiazem.  23.Gait difficulties with the use of Crestor in the past, but he is      tolerating simvastatin.   In following his current shortness of breath, I am not sure of the  etiology.  I have decided to proceed now with an event recorder and a  Myoview scan.  He had a recent echo showing good LV function.  We will  have to keep many other etiologies in mind.  He needs to have a follow-  up chest x-ray.  We need to consider over time whether he could be  having recurrent pulmonary emboli, although this seems unlikely with an  IVC filter and Coumadin therapy.  All other potential etiologies will be  kept in mind.  I will see him for followup.     Luis Abed, MD, [facility]  Electronically Signed    JDK/MedQ  DD: 11/19/2007  DT: 11/19/2007  Job #: [id number]

## 2010-12-25 NOTE — Assessment & Plan Note (Signed)
[facility]                            CARDIOLOGY OFFICE NOTE   NAME:[name] [name], [name] [name]                         MRN:          [id number]  DATE:10/29/2007                            DOB:          07/15/1941    [name] [name] is doing well.  He has had the return of some exertional  shortness of breath since I saw him last in September of 2008.  He also  had some mild palpitations.  He has not had any syncope or presyncope.  He has not had any major chest pain.  He has some continued chest wall  pain.   The patient also is having some difficulties with his Coumadin  adjustment.  Etiology is not clear, and it is being followed carefully.   PAST MEDICAL HISTORY:   ALLERGIES:  See the previous listing.   MEDICATIONS:  See the flow sheet in the chart.   REVIEW OF SYSTEMS:  Other than the points mention in the HPI, the review  of systems is negative.   PHYSICAL EXAMINATION:  VITAL SIGNS:  Blood pressure is 130/78 with a  pulse of 50.  GENERAL:  The patient is oriented to person, time and place.  Affect is  normal.  He is here with his wife today.  HEENT:  No xanthelasma.  He has normal extraocular motion.  There are no  carotid bruits.  There is no jugular venous distention.  LUNGS:  Clear.  Respiratory effort is not labored.  Cardiac exam reveals  an S1 with an S2.  There are no clicks or significant murmurs.  ABDOMEN:  Soft.  He has no peripheral edema.  EKG reveals a sinus rhythm  with sinus bradycardia.   PROBLEMS:  Problems are listed extensively on my note of January 28, 2007,  and they are reviewed completely, problems number 1 through 54.  #10.  Shortness of breath.  This seemed to have been improved with his  CABG, and now he is having some shortness of breath.  I am not convinced  he has had return of ischemia.  We will start first by reassessing his  LV function with a 2-D echo.  #12.  Coumadin therapy.  This is being adjusted carefully.  He did  have  significant pulmonary emboli, and we need to keep him on Coumadin.  He  also has factor VIII excess, which is another reason why he needs to  remain on Coumadin per Dr. Sherrlyn Hock of the hematology division in  [place].   I reassured [name] [name] today.  We will do a 2-D echo, and then I will see  him back.     [name] Abed, MD, [facility]  Electronically Signed    JDK/MedQ  DD: 10/29/2007  DT: 10/29/2007  Job #: [id number]   cc:   Rosendo Gros

## 2010-12-25 NOTE — Assessment & Plan Note (Signed)
HEALTHCARE                            CARDIOLOGY OFFICE NOTE   NAME:[name] [name], [name] [name]                         MRN:          [id number]  DATE:06/16/2008                            DOB:          Sep 27, 1940    HISTORY OF PRESENT ILLNESS:  [name] returns today for the followup of  his blood pressure, dizzy spells, and coronary artery disease.  He has  been having spells that were marked enough to be called syncope.  Ultimately, we decided that to stop his lisinopril, and lower his  metoprolol dose.  He really felt much better.  When I saw him on  May 04, 2008,  his systolic pressure was slightly higher than I  like.  I did not change his medicines and decided to see him back in  followup.  Since that visit he has had a few spells of feeling weak.  He has not had any true syncope.  Also, his wife mentions that he may be  having some problems with memory and mood swings.  He has some  difficulties with his thought process during conversation.  Also  complicating all of this are the many medications that he has to take  both preparing for his Humira and the Humira itself.   PAST MEDICAL HISTORY:   ALLERGIES:  See the list in the chart.   MEDICATIONS:  See the medication list in the chart.   OTHER MEDICAL PROBLEMS:  See the complete list on the note of December 08, 2007.   REVIEW OF SYSTEMS:  He is not having any GI or GU problems.  He is not  having any fever or sweats.  He has no rashes or headaches.  Otherwise,  his review of systems is negative.   PHYSICAL EXAMINATION:  VITAL SIGNS:  Blood pressure is 134/76, with a  heart rate of 63.  His weight is stable at 199 pounds.  GENERAL:  The patient is oriented to person, time, and place.  Affect is  normal.  HEENT:  Reveals no xanthelasma.  He has normal extraocular motion.  There are no carotid bruits.  There is no jugular venous distention.  LUNGS:  Clear.  Respiratory effort is not labored.  CARDIAC:  Reveals S1 with an S2.  There are no clicks or significant  murmurs.  ABDOMEN:  Soft.  He has no significant peripheral edema.   EKG reveals sinus rhythm.   PROBLEMS:  Are listed completely on the note of December 08, 2007 and  reviewed completely.  1. Recurrent syncope at that time.  This seems to have improved with      stopping his lisinopril.  I am holding and decreasing his beta-      blocker.  Unfortunately, he does have some return of slight      symptoms.  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  Therefore, these will have to be kept in mind.  He  did as outlined before wear an event recorder.  He had sinus rhythm      and sinus bradycardia.  He also had one burst of supraventricular      tachycardia 16 beats.  I am not sure if it was atrial fib or      another form of supraventricular tachycardia.  If he continues to      have worsening symptoms.  We will have to review this issue again      and decide if he needs a loop recorder.  Keep in mind, however,      that some of these things may be neurally mediated.  2. Hypertension.  His pressure is stable now and I will not change his      meds.  3. New problem, question of some memory loss and mood swings and      difficulty in bringing his thoughts across during conversation.  I      do not have any workup for this at this time, but it will be kept      in mind.  I will see him back in 6 weeks for followup.     [name] Abed, MD, [facility]  Electronically Signed    JDK/MedQ  DD: 06/16/2008  DT: 06/17/2008  Job #: [id number]   cc:   [name] [name]

## 2010-12-25 NOTE — Assessment & Plan Note (Signed)
[facility]                            CARDIOLOGY OFFICE NOTE   NAME:[name], [name] SAVITZ                         MRN:          [id number]  DATE:07/27/2008                            DOB:          July 27, 1941    [name] is doing well.  I saw him last on June 16, 2008.  See my  complete problem list on my note of December 08, 2007.  He is not having  any recurrent syncope or presyncope.  We had lowered some of his  medication doses.  I saw him 6 weeks ago and we decided to follow his  meds to be sure that he was not becoming hypertensive.  He is very  reliably checked his blood pressure.  His pulse ranges from 52-72.  Blood pressure ranges from 122 up to 151.  The days that he takes Humira  if anything his pressure is slightly higher.  His pulse rate is not  elevated.  He knows that he becomes a little apprehensive at that time  and his pressure make go up.  With some of his neuro-mediated responses,  however, it could always be possible that if he were to become  apprehensive enough, his pressure might go down, but we are not seeing  that.  Overall, he is doing well.   PAST MEDICAL HISTORY:   ALLERGIES:  See the flow sheet.   MEDICATIONS:  See the flow sheet.   REVIEW OF SYSTEMS:  He is not having any GI or GU symptoms.  He has no  fevers, chills, or headache.  His review of systems otherwise is  negative.   PHYSICAL EXAMINATION:  VITAL SIGNS:  Blood pressure today 137/78 with a  pulse of 65.  GENERAL:  The patient is oriented to person, time, and place.  Affect is  normal.  He is here with his wife today.  HEENT:  No xanthelasma.  He has normal extraocular motion.  NECK:  There are no carotid bruits.  There is no jugular venous  distention.  LUNGS:  Clear.  Respiratory effort is not labored.  CARDIAC:  An S1 with an S2.  There are no clicks or significant murmurs.  ABDOMEN:  Soft.  EXTREMITIES:  He has no peripheral edema.   No labs were done  today.   [name] is stable.  See my prior problem list.  The issue of syncope is  stable.  His blood pressure is stable.  No change in his meds at this  time.  I will see him back in 3 months.     [name] Abed, MD, [facility]  Electronically Signed    JDK/MedQ  DD: 07/27/2008  DT: 07/28/2008  Job #: [id number]   cc:   [name] [name]

## 2010-12-25 NOTE — Op Note (Signed)
[name] [name], KRUMHOLZ                  ACCOUNT NO.:  [id number]   MEDICAL RECORD NO.:  [id number]          PATIENT TYPE:  AMB   LOCATION:  SDS                          FACILITY:  [facility]   PHYSICIAN:  [name]. [name], [name] DATE OF BIRTH:  Dec 28, 1940   DATE OF PROCEDURE:  12/10/2006  DATE OF DISCHARGE:                               OPERATIVE REPORT   PREOPERATIVE DIAGNOSIS:  Severe 3-vessel coronary artery disease.   POSTOPERATIVE DIAGNOSIS:  Severe 3-vessel coronary artery disease.   PROCEDURE:  Median sternotomy for coronary artery bypass grafting x5  (left internal mammary artery to distal left anterior descending  coronary artery, saphenous vein graft to first diagonal branch,  saphenous vein graft to ramus intermediate branch with sequential  saphenous vein graft to circumflex marginal branch, saphenous vein graft  to third left posterolateral branch, endoscopic saphenous vein harvest  from right thigh and right lower leg)   SURGEON:  Dr. Purcell Nails.   ASSISTANT:  Dr. Charlett Lango.   SECOND ASSISTANT:  Ms. Zadie Rhine.   ANESTHESIA:  General.   BRIEF CLINICAL NOTE:  The patient is a 70 year old gentleman with  multiple medical problems but no previous history of coronary artery  disease.  The patient presents with a 3-week history of new-onset  substernal chest discomfort that on one occasion was associated with a  syncopal episode.  He was referred to Dr. [name] and underwent a 2-D  echocardiogram and a stress Myoview exam.  Echocardiogram was normal but  Myoview scan was abnormal and notable for lateral wall ischemia.  He  subsequently underwent cardiac catheterization by Dr. [name] on  April 21.  He was found to have severe 3-vessel coronary artery disease  with preserved left ventricular function.  A full consultation note has  been dictated previously.   OPERATIVE CONSENT:  The patient and his wife have been counseled at  length regarding  the indications, risks, and potential benefits of  surgery.  Alternative treatment strategies have been discussed.  They  understand and accept all associated risks of surgery and desire to  proceed as described.   OPERATIVE FINDINGS:  1. Mild left ventricular dysfunction (ejection fraction 50%).  2. Fair quality left internal mammary artery conduit with excellent      flow.  3. Good-quality saphenous vein conduit.  4. Good-quality target vessels for grafting   OPERATIVE NOTE IN DETAIL:  The patient was brought to the operating room  on the above-mentioned date and central monitoring was established by  the anesthesia service, under the care and direction of Dr. [name].  Specifically, a Swan-Ganz catheter was placed through the  right internal jugular approach.  A radial arterial line was placed.  Intravenous antibiotics were administered.  Following induction with  general endotracheal anesthesia, a Foley catheter was placed.  The  patient's chest, abdomen, both groins, and both lower extremities were  prepared and draped in a sterile manner.  Baseline transesophageal  echocardiogram was performed by Dr. [name].  This demonstrates mild  left ventricular dysfunction with inferior wall hypokinesis.  No  other  abnormalities are noted.   A median sternotomy incision is performed and left internal mammary  artery is dissected from the chest wall and prepared for bypass  grafting.  The left internal mammary artery is fair to good quality  conduit.  Simultaneously the saphenous vein was obtained from the  patient's right thigh and the upper portion of the right lower leg using  endoscopic vein harvest technique.  The saphenous vein is notably good-  quality conduit.  After the saphenous vein was removed, the small  incisions in the right thigh were closed in multiple layers with a  running absorbable suture.  The patient is heparinized systemically.  The left internal  mammary artery is transected distally and is noted to  have good flow.   The pericardium is opened.  The ascending aorta is normal in appearance.  The ascending aorta and the right atrium were cannulated for  cardiopulmonary bypass.  Cardiopulmonary bypass is begun and the surface  of the heart was inspected.  Distal sites were selected for coronary  bypass grafting.  A temperature probe is placed in the left ventricular  septum.  A cardioplegic catheter is placed in the ascending aorta.   The patient is allowed to cool passively to 32 degrees systemic  temperature.  The aortic crossclamp was applied and cold blood  cardioplegia is administered initially in an antegrade fashion through  the aortic root.  Iced saline slush was applied for topical hypothermia.  The initial cardioplegic arrest and myocardial cooling are felt to be  excellent.  Repeat doses of cardioplegia are administered intermittently  throughout the crossclamped portion of the operation through the aortic  root and down the subsequently placed vein grafts to maintain left  ventricular septal temperature below 15 degrees centigrade.   The following distal coronary anastomoses were performed:  1. The third posterolateral branch off the distal left circumflex      coronary artery is grafted with a saphenous vein graft in an end-to-      side fashion.  This was the largest and most dominant vessel off      the distal left circumflex coronary artery.  There was left      dominant coronary circulation and this represents the dominant      vessel on the inferior wall of the left ventricle.  It measures 2.0      mm at the site of distal bypass and is a good-quality target vessel      for grafting.  2. The ramus intermediate branch is grafted with a saphenous vein      graft in a side-to-side fashion.  This vessel measured 1.4 mm in      diameter and is a good-quality target vessel for grafting. 3. The circumflex marginal  branch is grafted using a sequential      saphenous vein graft off of the vein placed in the ramus      intermediate branch.  This vessel measures 1.3 mm in diameter and      is a fair-quality target vessels for grafting.  It is chronically      occluded proximally.  4. The first diagonal branch off the distal left anterior descending      coronary artery is grafted with a saphenous vein graft in an end-to-      side fashion.  This vessel measured 1.5 mm in diameter and is a      good-quality target vessel for grafting.  5. The  distal left anterior descending coronary artery is grafted with      left internal mammary artery in an end-to-side fashion.  This      vessel was intramyocardial.  It measures 2.0 mm in diameter and is      a good-quality target vessel for grafting.   All 3 proximal saphenous vein anastomoses were performed directly to the  ascending aorta prior to removal of the aortic crossclamp.  The left  ventricular septal temperature rises rapidly with reperfusion of the  left internal mammary artery.  The aortic crossclamp was removed after a  total crossclamp time of 92 minutes.  The heart begins to beat  spontaneously without need for cardioversion.  All proximal and distal  anastomoses are inspected for hemostasis and appropriate graft  orientation.  Epicardial pacing wires were fixed to the right  ventricular outflow tract into the right atrial appendage.  The patient  is rewarmed to 37 degrees centigrade temperature.  The patient is weaned  from cardiopulmonary bypass without difficulty.  The patient's rhythm at  separation from bypass is normal sinus rhythm.  No inotropic support is  required.  Total cardiopulmonary bypass time for the operation is 111  minutes.  Follow-up transesophageal echocardiogram performed by Dr.  [name] after separation from bypass demonstrates normal left  ventricular function.   The venous and arterial cannulae are removed  uneventfully.  Protamine  was administered to reverse the anticoagulation.  The mediastinum and  the left chest are irrigated with saline solution containing vancomycin.  Meticulous surgical hemostasis is ascertained.  The mediastinum and left  chest are drained with 3 chest tubes exited through separate stab  incisions inferiorly.  The pericardium and soft tissues anterior to the  aorta are reapproximated loosely.  The sternum was closed with double-  strength sternal wire.  The soft tissues anterior to the sternum were  closed in multiple layers and the skin is closed with a running  subcuticular skin closure.   The patient tolerated the procedure well and was transported to the  surgical intensive care unit in stable condition.  There were no  intraoperative complications.  All sponge, instrument and needle counts  were verified correct at completion of the operation.  No blood products  were administered.      [name]. [name], [name] Electronically Signed     CHO/MEDQ  D:  12/10/2006  T:  12/10/2006  Job:  [id number]   cc:   Luis Abed, MD, [facility]  Veverly Fells. Excell Seltzer, MD  Rosendo Gros  Dr. Sherrlyn Hock

## 2010-12-25 NOTE — Assessment & Plan Note (Signed)
[facility]                            CARDIOLOGY OFFICE NOTE   NAME:[name] [name], [name] [name]                         MRN:          [id number]  DATE:05/01/2007                            DOB:          [birth year]    Mr. Devino is doing very well.  My note of January 28, 2007 reviews  extensively his problem list.  He is doing very well.  He is not having  chest pain.  He is not having shortness of breath.  There has been no  syncope or pre-syncope.   He is 5 months out from his CABG.  He asks me now if he can shoot a high-  powered rifle.  He has experience with this in the past.  He is right-  handed.  Pressure would be to the right upper chest.  This does not  affect the sternum significantly.  I believe that he is stable for this.   PAST MEDICAL HISTORY:   ALLERGIES:  Listed previously in the chart.   MEDICATIONS:  1. Clonazepam.  2. Vitamin B injections.  3. Vitamins.  4. Fish oil.  5. Coumadin.  6. Humira weekly.  7. Folic acid.  8. AcipHex.  9. Lisinopril.  10.Aspirin.  11.Lopressor.  12.Prednisone as needed.  13.Prilosec OTC.  14.Simvastatin 20.  He is tolerating the simvastatin.   OTHER MEDICAL PROBLEMS:  See the extensive list on the note of January 28, 2007.   REVIEW OF SYSTEMS:  He mentions that there is a tiny residual suture  that is probably a cutaneous suture on his sternal wound.  Otherwise,  the review of systems is negative.   PHYSICAL EXAM:  Blood pressure is 142/76 with a pulse of 47.  The patient is oriented to person, time, and place.  Affect is normal.  He is here with his wife today.  HEENT:  Reveals no xanthelasma.  He has normal extraocular motions.  There are no carotid bruits.  There is no jugular venous distension.  LUNGS:  Clear.  Respiratory effort is not labored.  CARDIAC:  Reveals an S1, S2.  There are no clicks or significant  murmurs.  ABDOMEN:  Soft.  He has trace peripheral edema.  No labs are done today.  He  will need followup lipids and liver.   PROBLEMS:  Listed on my note of January 28, 2007.  The problems are stable.  PROBLEM #24.  Very small suture from the sternal cutaneous repair.  This  was snipped off and it is stable.     Luis Abed, MD, [facility]  Electronically Signed    JDK/MedQ  DD: 05/01/2007  DT: 05/01/2007  Job #: [id number]   cc:   Rosendo Gros

## 2010-12-25 NOTE — Assessment & Plan Note (Signed)
OFFICE VISIT   [name] [name], [name] [name]  DOB:  [birth year]                                        January 12, 2007  CHART #:  [id number]   [name] is an 70 year old gentleman who underwent coronary bypass  grafting times 5 by Dr. [name] on December 10, 2006. Postoperatively he had  some atrial fibrillation that resolved before discharge and he was  discharged on postoperative day #7. He was already on Coumadin because  of a previous history of pulmonary embolus and he remains on that. That  is being followed through the [place] office.   [name] states that he has been doing well since surgery. He is walking  30 minutes a day. He is still sore but he is only taking Tylenol for the  pain. He is anxious to start cardiac rehab. He has not had any recurrent  anginal  type symptoms.   On physical examination [name] is a 70 year old white male in no acute  distress. His blood pressure is 118/66, pulse 56, respirations 18, his  oxygen saturation is 96% on room air. Lungs: Had diminished breath  sounds at the bases but otherwise clear. His cardiac exam: He has a  regular rate and rhythm, normal S1, and S2. No rubs, murmurs, or  gallops. His sternum is stable, sternal incision is clean, dry, and  intact. Chest tube sites are healing well. His leg incisions are healing  well. He has no significant peripheral edema.   Chest x-ray shows improved mild bibasilar atelectasis.   IMPRESSION:  [name] is a 70 year old gentleman, he is now about 6  weeks out from coronary bypass grafting by Dr. [name]. He is doing well at  this point in time. He may begin driving appropriate precautions were  discussed. He no longer has to be concerned about his 10 pound weight  limit on lifting but he should be careful and cautious and gradually  increase his physical activities. He is ok from our standpoint to start  cardiac rehab. I told him to contact Dr. Henrietta Hoover office to get the formal  go ahead. He  will continue to be followed by Dr. Zackery Barefoot  from the standpoint of his cardiology issues. We will be glad to see him  back in our office anytime if we can be of any further assistance.   [name] Dorris Fetch, [name]  Electronically Signed   SCH/MEDQ  D:  01/12/2007  T:  01/12/2007  Job:  [id number]   cc:   Luis Abed, MD, [facility]  [name]. [name], [name]

## 2010-12-25 NOTE — Discharge Summary (Signed)
NAMEEISEN, ROBENSON                  ACCOUNT NO.:  [id number]   MEDICAL RECORD NO.:  [id number]          PATIENT TYPE:  CVT   LOCATION:  AVTV                         FACILITY:  [facility]   PHYSICIAN:  [name]. [name], [name] DATE OF BIRTH:  Jan 08, 1941   DATE OF ADMISSION:  12/03/2006  DATE OF DISCHARGE:                               DISCHARGE SUMMARY   PRIMARY DIAGNOSIS:  Severe three-vessel coronary artery disease.   IN-HOSPITAL DIAGNOSES:  1. Postoperative atrial fibrillation.  2. Acute blood loss anemia.  3. Postoperative volume overload.   SECONDARY DIAGNOSES:  1. History of Crohn's disease.  2. History of orthostasis.  3. Hypertension.  4. Status post cholecystectomy.  5. Hypercoagulable state on chronic Coumadin therapy.  6. History pulmonary April 2007.  7. History of deep venous thrombosis left lower leg April 2007.  8. 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  9. Obstructive sleep apnea.  10.Benign colon polyp.  11.Status post back surgery x2.  12.Status cataract extraction x2.  13.Colonoscopic polypectomy.   IN-HOSPITAL OPERATIONS AND PROCEDURES:  Coronary bypass grafting x5  using a left internal mammary artery to distal left anterior descending  coronary artery, saphenous vein graft to first diagonal branch,  saphenous vein graft to ramus intermediate branch and sequential  saphenous vein graft to circumflex marginal branch, saphenous vein graft  to left posterior lateral branch, and endoscopic saphenous vein harvest  from right thigh and right lower extremity.   HISTORY AND PHYSICAL AND HOSPITAL COURSE:  The patient is a 66-year  gentleman with multiple medical problems but  no previous history of  coronary artery disease.  The patient presents with a 3-week history of  new-onset substernal chest discomfort that on one occasion was  associated with a syncopal episode.  He was referred to Dr. Myrtis Ser and  underwent 2-D echocardiogram and stress  Myoview exam.  Echocardiogram  was normal but Myoview scan was abnormal and notable for lateral wall  ischemia.  He subsequently underwent cardiac catheterization by Dr.  Excell Seltzer April 21.  The patient was found to have three-vessel coronary  artery disease with preserved left ventricular function.  The patient  was seen and evaluated by Dr. [name] following catheterization.  Dr. [name]  discussed with the patient undergoing coronary artery bypass grafting.  Risks and benefits were discussed.  The patient acknowledged  understanding and agreed to proceed.  For details of the patient's last  medical history and physical exam, please see dictated H&P.   The patient was taken to the operating room December 10, 2006, where he  underwent coronary bypass grafting x5 using left internal mammary artery  to distal left anterior descending coronary artery, saphenous vein graft  to first diagonal branch, saphenous vein graft to intermedius branch  with sequential saphenous vein graft to circumflex marginal branch,  saphenous vein graft to third left posterior lateral branch.  Endoscopic  saphenous vein harvest from right thigh and right lower leg done.  The  patient tolerated his procedure well and was transferred to the  intensive care unit in stable condition.  Following  surgery the patient  noted to be hemodynamically stable.  He was extubated the evening of  surgery.  Following extubation the patient noted to be alert and  oriented x4, neuro intact.  Postoperative day #1 the patient's vitals  were stable.  He was able to be weaned off Neo-Synephrine.  His  hemoglobin and hematocrit did drop to 7.1 and 21%.  The patient was  transfused 2 units of packed red blood cells.  The patient does have a  history of hypercoagulable state and cardiology recommended the patient  starting back on Coumadin as soon as possible.  The patient's  hemoglobin/hematocrit were followed and did improve post transfusion.  The  patient was started on Coumadin postoperative day #2.  Daily PT/INRs  were obtained for the patient's Coumadin dosing and the patient's  hemoglobin and hematocrit were followed closely.  These were stable  prior to discharge.  The patient was near therapeutic at 1.8 prior to  discharge in INR.  Postoperatively, the patient did develop  postoperative atrial fibrillation postoperative day #3.  He was started  on IV amiodarone.  The patient did convert back to normal sinus rhythm.  Unfortunately, after starting amiodarone the patient was noted to have a  diffuse erythematous rash.  Felt that this was a drug reaction to  amiodarone and amiodarone was discontinued.  His Lopressor was increased  and the patient was started on digoxin and diltiazem.  The patient  continued to go in and out of atrial fibrillation.  He has had several  episodes of atrial flutter.  These are noted to be rate controlled.  It  was thought that the patient's atrial fibrillation was stable with his  near therapeutic Coumadin dose.  This was followed closely.  Postoperatively, the patient's vital signs were watched closely.  He was  afebrile.  He was able to be weaned off oxygen, saturating greater than  90% on room air.  Blood pressure remained stable.  Again, heart rate was  stable with rate controlled atrial fibrillation with intermittent sinus  rhythm.  Blood sugars were followed postoperatively.  The patient did  not have a history of diabetes mellitus and these were noted to be  stable.  The patient's pulmonary status remained stable.  His incisions  were clean, dry and intact and healing well.   The patient is tentatively ready for discharge home postoperative day  #7, Dec 17, 2006.   FOLLOWUP APPOINTMENTS:  A followup appointment has been arranged with  Dr. Dorris Fetch for January 12, 2007, at 11:45 a.m.  The patient will follow up with Dr. Myrtis Ser Dec 24, 2006, at 2 p.m..  PT/INR will be done at this  time as  well.   ACTIVITY:  The patient instructed no driving until released to do so, no  heavy lifting over 10 pounds.  The patient told to ambulate 3-4 times  per day, progress as tolerated, and continue his breathing exercises.   The patient was told to shower, washing his incisions using soap and  water.  He is to contact the office if he develops any drainage or  opening from any of his incision sites.   DIET:  The patient is educated on diet to be low-fat, low-salt.   DISCHARGE MEDICATIONS:  1. AcipHex 20 mg daily.  2. Clonazepam 0.5 mg daily.  3. Vitamin B12 injections monthly.  4. Multivitamin daily.  5. Aspirin 81 mg daily.  6. Lopressor 50 mg b.i.d.  7. Lisinopril 5 mg daily.  8. Fish oil daily.  9. Digoxin 0.125 mg daily.  10.Coumadin 5 mg at night.  11.Folic acid 1 mg daily.  12.Niferex 150 mg daily.  13.Zegerid 40/1100 mg before dinner.  14.Lasix 40 mg daily x7 days.  15.Potassium chloride 20 mEq daily x7 days.  16.Diltiazem LA 240 mg daily.  17.Caltrate with vitamin D.  18.Ultram 50 mg one to two tablets q.4-6h. p.r.n. pain.  19.Humira injections every-other week.  With Humira injections, the      patient takes prednisone 40 mg twice daily on day of Humira      injection with Allegra 180 mg well as Benadryl 25 mg and Zantac 300      mg.      Theda Belfast, PA      [name]. [name], [name]  Electronically Signed    KMD/MEDQ  D:  12/17/2006  T:  12/17/2006  Job:  [id number]   cc:   Luis Abed, MD, [facility]

## 2010-12-25 NOTE — Assessment & Plan Note (Signed)
[facility]                            CARDIOLOGY OFFICE NOTE   NAME:Pursel, BRAYON BIELEFELD                         MRN:          [id number]  DATE:05/04/2008                            DOB:          1941-07-15    HISTORY OF PRESENT ILLNESS:  Mr. Gittleman is doing well.  He is felling  well.  He is stable.  He had had problems with syncope and presyncope  with an evaluation and I have lowered and I stopped his lisinopril and  his metoprolol dose was lowered.  He is feeling much better.  Recently  his systolic blood pressure has been mildly elevated.  It is mildly  elevated today.  We will follow it longer before making other changes.  As he says he is feeling the best he has in a long time.   PAST MEDICAL HISTORY:   ALLERGIES:  See the prior listing of his allergies.   MEDICATIONS:  1. Vitamin B injections.  2. Vitamins.  3. Fish oil.  4. Coumadin.  5. Folic acid.  6. Aspirin.  7. Simvastatin.  8. Prednisone around the time of his Cl[place]ara shot.  9. Metoprolol 25.  10.Omeprazole 20.  11.Fexofenadine 180.   OTHER MEDICAL PROBLEMS:  See the prior note of December 08, 2007 with his  extensive problem list.   REVIEW OF SYSTEMS:  He feels great today.  His review of systems is  negative.   PHYSICAL EXAMINATION:  VITAL SIGNS:  His weight is up to 199.  This is  increasing, but he has been at this range before.  Blood pressure today  is 150/80 with a pulse of 60.  GENERAL:  The patient is oriented to person, time, and place.  Affect is  normal.  HEENT:  Reveals no xanthelasma.  He has normal extraocular motion.  NECK:  There are no carotid bruits.  There is no jugular venous  distention.  LUNGS:  Clear.  Respiratory effort is not labored.  CARDIAC:  Reveals S1 and S2.  There are no clicks or significant  murmurs.  ABDOMEN:  Soft.  He has no peripheral edema.   PROBLEMS:  Problems are listed extensively on the note of December 08, 2007.  His systolic blood  pressure is a little high today.  I have  decided not to change his medicines.  I will see him back 6 weeks, to re-  review this issue.  We may want to restart very low-dose ACE inhibitor.     Luis Abed, MD, [facility]  Electronically Signed    JDK/MedQ  DD: 05/04/2008  DT: 05/04/2008  Job #: [id number]   cc:   Rosendo Gros

## 2010-12-25 NOTE — Assessment & Plan Note (Signed)
[facility]                            CARDIOLOGY OFFICE NOTE   NAME:Woolston, MARKS SCALERA                         MRN:          [id number]  DATE:12/08/2007                            DOB:          01-16-1941    Mr. Avina is seen in followup.  See my extensive note of November 19, 2007.  I was concerned about how he was feeling and doing.  He had a stress  Myoview scan which showed no ischemia.  He exercised for 6 minutes.  He  had fatigue.  There was no sign of scar or ischemia.  Ejection fraction  was 67%.  He has also been wearing an event recorder.  Random checks  have shown some bradycardia.   He then had an episode of feeling some chest discomfort on December 05, 2007, and strip was sent and he had sinus bradycardia with a rate in the  50s.  On December 07, 2007, he was sitting down near his garage, and he  fell over forward with a syncopal episode.  His wife with this.  He woke  up rapidly, but he did could not get the monitor on to record.  Eventually, his blood pressure was taken, and his systolic was 97.   The patient and his wife also mentioned to me that there is ongoing  concern, that he feels poorly around the time of his Humira injections.  Dr. Markham Jordan is overseeing this.   PAST MEDICAL HISTORY:  ALLERGIES: METRONIDAZOLE, ASACOL, REMICADE,  PURINETHOL, CODEINE, QUESTION OF A RASH FROM AMIODARONE AND QUESTION OF  A RASH FROM DILTIAZEM.   MEDICATIONS:  1. Clonazepam 0.5 at night.  2. Vitamin B12 injections.  3. Vitamin B injections.  4. Fish oil.  5. Coumadin as directed.  6. Folic acid.  7. Lisinopril 5 (to be held).  8. Aspirin 81.  9. Prednisone 48 hours prior to a shot.  10.Simvastatin 40.  11.Metoprolol 50 b.i.d. to be decreased to 25 mg at night only.  12.Humira injection monthly.  13.Colace, Allegra, Benadryl, and Zantac.   OTHER MEDICAL PROBLEMS:  See the extensive list on my note of November 19, 2007.   REVIEW OF SYSTEMS:  Other than  the HPI, his review of systems is  negative.   PHYSICAL EXAM:  VITAL SIGNS:  Weight is 195 pounds.  Blood pressure,  orthostatic check was done.  When lying, pulse was 51, blood pressure  137/73, sitting pulse 53 with a blood pressure 142/79, standing pulse  56, blood pressure 139/79, 2 minutes standing pulse 55, blood pressure  141/77, and 5 minutes standing pulse 54, blood pressure 139/78.  GENERAL:  The patient is here with his wife today.  He is oriented to  person, time and place.  Affect is normal.  SKIN:  His skin has a slight reddish tint that he has had over time.  HEENT:  Reveals no xanthelasma.  He has normal extraocular motion.  There are no carotid bruits.  There is no jugular venous distention.  LUNGS:  Clear.  Respiratory effort is not labored.  CARDIAC:  Exam reveals an S1 with an S2.  There are no clicks or  significant murmurs.  CHEST:  His chest lesion is nicely healed.  He has no significant  peripheral edema.  EKG today shows no change.  There is sinus  bradycardia with a rate of 51.   The monitor strips are outlined above, in the first paragraph.   PROBLEMS:  Include:  1. Recurrent syncope.  He had syncope on several occasions in the      past.  We thought it may have been due to his Crohn's disease,      orthostasis or Remicade.  He also has history of pulmonary embolus.      We do not know if this was related.  There is also a question that      he feels poorly with Humira injections.  He had a significant      syncopal episode yesterday.  There is no recording from that event.      He does have sinus bradycardia.  I have significantly reduced his      beta-blocker from 50 b.i.d. to 25 mg daily, as of today.  Also,      because his blood pressure was recorded as 97 when he had his      syncopal episode, I have held his ACE inhibitor.  We will limit his      activities role while.  I will see him back for early follow-up      concerning the med changes and he  will continue to wear his      monitor, and I will arrange for evaluation by our EP team to see if      there is an indication for an implantable loop recorder.  2. History of Crohn's disease that is ongoing but being treated.  3. History of orthostasis, although he does not appear to be      orthostatic now.  4. History of supraventricular tachycardia in the past.  5. History of hypertension which is not an active issue.  6. History of anemia with Crohn's disease.  7. Steroid use as needed.  8. Status post cholecystectomy.  9. History of colon tumor.  10.Shortness of breath.  This is not playing a major role at this      time.  11.Episode in 2007 with documented pulmonary embolus by CT.  He had an      IVC filter placed, because of fear that he could not take Coumadin,      but ultimately he is back on Coumadin.  Also, had a chest CT that      showed his pulmonary emboli and that he had deep venous thrombosis      in April 2007.  12.Ongoing Coumadin therapy.  13.Question of sleep apnea.  14.Ejection fraction that continues to be in the 60% range.  15.Treatment with Humira for Crohn's disease.  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  This is an additional reason for his Coumadin.  17.Coronary disease plus CABG x5 in April 2008.  His recent Myoview      scan reveals no definite ischemia.  I have considered the      possibility of catheterization, but this will be a major      undertaking, and I have decided not to proceed in this way.  18.History of some anxiety.  19.Question of rashes from diltiazem and see the allergy  history.  20.Gait difficulties with Crestor, but he is tolerating simvastatin.   I am concerned about his recurring episodes of syncope recently.  His  activity level will be limited for a while.  Metoprolol dose to be  decreased, lisinopril to be held.  EP evaluation to be arranged.  Early  followup with me to be  arranged.     Luis Abed, MD, [facility]  Electronically Signed    JDK/MedQ  DD: 12/08/2007  DT: 12/08/2007  Job #: [id number]   cc:   Rosendo Gros

## 2010-12-25 NOTE — Assessment & Plan Note (Signed)
[facility]                            CARDIOLOGY OFFICE NOTE   NAME:[name], [name] HOWLAND                         MRN:          [id number]  DATE:01/28/2007                            DOB:          Jul 06, 1941    [name] [name] is seen for followup today and he is doing extremely well. He  looks greatly improved.  His energy level is increased.  He and his wife  are very excited about how well he feels.  He has been released by the  heart surgeons.  He is not having any chest pain or shortness of breath.  He is going about very reasonable activities.  It is safe for him to  undergo other work including his colonoscopy and his dental work as  needed.  He is post CABG.  We had adjusted his blood pressure.   PAST MEDICAL HISTORY:   ALLERGIES:  METRONIDAZOLE,  ASACOL, REMICADE, PURINETHOL, CODEINE.  THERE IS NOW ALSO A QUESTION OF A RASH FROM AMIODARONE AND A RASH FROM  DILTIAZEM.  Most recently since I saw him last he had a rash and called  in and his Diltiazem was stopped. We will have to keep in mind that we  are not 100% sure about these drugs.   MEDICATIONS:  1. Clonazepam.  2. Vitamin B monthly.  3. Centrum Silver.  4. Fish oil.  5. Calcium.  6. Coumadin.  7. Humera for Crohn's disease.  8. Folic acid.  9. Aciphex.  10.Lisinopril.  11.Aspirin 81.  12.Lopressor 100 mg.   OTHER MEDICAL PROBLEMS:  See the extensive list below.   REVIEW OF SYSTEMS:  He is doing great.  He has no complaints and no  difficulties. Review of systems is negative.   PHYSICAL EXAMINATION:  VITAL SIGNS:  Weight is 193 pounds. Blood  pressure today 112/68 with a pulse of 60. He has shown me records of his  home blood pressure and it has ranged from 115-130 systolic with  excellent heart rate response.  GENERAL:  The patient is oriented to person, time and place.  Affect is  normal and he is much more alert and happy than I have seen him in a  long time.  LUNGS:  Clear.  Respiratory effort is not labored.  HEENT:  Reveals no xanthelasma.  He has normal extraocular motion.  NECK:  There are no carotid bruits. There is no jugular venous  distension.  CHEST:  His chest lesion is nicely healed.  CARDIAC EXAM:  Reveals an S1 and S2. There are no clicks or significant  murmurs.  ABDOMEN:  Soft.  Has no significant peripheral edema. He has normal  bowel sounds.  MUSCULOSKELETAL:  There are no major musculoskeletal deformities.   No labs are done today.   PROBLEMS INCLUDE:  1. History of syncope on several occasions in the past. We thought      that these may have been related to episodes when he felt poorly      with Crohn's disease.  The possible orthostasis and possibly      allergic reaction  to Remicade.  There is a history of pulmonary      embolus and we do not know if this was related or not.  In addition      we do not know if his coronary disease has played a role but we do      not think that he had arrhythmias causing this.  2. History of Crohn's disease.  This is a significant ongoing problem      that is managed well and he is on medications for this.  3. History of orthostasis over time.  4. Brief episodes of supraventricular tachycardia as an outpatient in      the past for which we did not find arrhythmia's.  5. History of hypertension nicely treated.  6. History of anemia with Chron's disease.  7. History of steroid use over time in the past.  8. Status post cholecystectomy.  9. History of a colon tumor.  10.Shortness of breath.  We thought that volume was playing a role.      This is no longer a problem.  It may have been related to his      cardia ischemia.  11.Episode in 2007 when he did have documented pulmonary emboli by CT      scan.  Because of fear that he would not be able to take Coumadin      with his Crohn's disease an IVC filter was placed.  Ultimately he      was treated with Coumadin.  It is also of note that when he had  his      CT showing his pulmonary emboli that he did have deep venous      thrombosis and that was in April of 2007.  12.Coumadin therapy that is used ongoing for his pulmonary emboli in      April of 2007.  13.Question of sleep apnea.  14.Ejection fraction 60%.  15.Treatment with Humera for Crohn's disease.  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  This is an additional reason why he will      remain on Coumadin indefinitely.  17.Coronary artery disease status post CABG x5 with a LIMA to the LAD      vein graft to the diagonal, sequential vein graft to the ramus and      circ marginal and a vein graft to the third posterolateral by Dr.      Cornelius [name].  18.Postoperative atrial fibrillation which has resolved.  19.Amiodarone use in the hospital with a rash and question that it was      related to amiodarone and this was stopped.  20.History in the past also when he was on a combination of Cipro and      Flagyl but later he received Cipro without problems.  21.Anxiety.  He uses a small amount of Klonopin for this.  We used a      slightly higher dose early post-op but this has now stabilized.  22.Recent rash with the possibility that it was related to Diltiazem.      This was stopped and the rash is gone.  23.The patient is on aspirin.   ADDITIONAL PROBLEM:  Difficulty with taking Crestor in the past.  This  was in the remote past and he received it from [name] [name] and had  muscle aches and pains.  He is post CABG and clearly being on a Statin  is appropriate.  I believe  that we should tray a low dose of Simvastatin  and see if he tolerates it without difficulty.   The patient is stable.  He can have dental work done safely.  He can  also have a colonoscopy done safely.  He is stable.     [name] Abed, MD, [facility]  Electronically Signed    JDK/MedQ  DD: 01/28/2007  DT: 01/28/2007  Job #: [id number]  cc:   [name] [name]

## 2010-12-25 NOTE — Assessment & Plan Note (Signed)
[facility]                            CARDIOLOGY OFFICE NOTE   NAME:Frieze, MAAZ SPIERING                         MRN:          [id number]  DATE:12/17/2006                            DOB:          February 02, 1941    Mr. Davisson will be going home from the hospital soon at [facility].  He had  successful elective CABG done by Dr. [name].  He is being very carefully  re-coumadized and he is doing very well.  Postoperatively he did have  atrial fibrillation and atrial flutter.  He received amiodarone for  approximately 2 days.  He developed hives on his lower extremities and  the amiodarone was stopped.  He actually converted to sinus rhythm and  then went back into atrial fibrillation and atrial flutter.  With the  addition of Cardizem and Digoxin his rate is nicely controlled.  Because  of these events he will go home on a beta blocker and Cardizem and  Digoxin for rate control.  Hopefully over time he will convert to sinus  and remain in sinus.  If not we will have to decide about other  medications or other possible approaches.   PROBLEM:  1. Status post CABG at this admission, quite successfully.  2. Coumadin therapy, very important as outlined before, including for      his history of pulmonary embolism and the question of the high      level of factor 8 thought to be a issue for him historically.  3. Paroxysmal atrial fibrillation and flutter.  He will go home on      medications for rate control and we will consider this over time.  4. History of a rash in the past in the combination of Cipro and      Flagyl and then he later received Cipro.  5. History of a rash on Dec 14, 2006.  At this time we think it is      related to amiodarone, but we are not 100 percent sure.  It is of      note that when he has had hives in the past they have been on his      entire body.  These were on his lower extremities only and seemed      somewhat worse when his legs were warm under  the covers.  I am not      sure if this has any clinical relevance or not.  6. Anxiety.  He needs his evening Klonopin and is stable with this.   He will go home on Digoxin 0.125 mg daily.  He may in fact need a higher  dose, but I am not inclined to use that at this time.   MEDICATIONS:  1. Diltiazem 240 mg once a day.  2. Lasix 40 mg.  3. Potassium.  4. Lisinopril with his dose decreased to 5 mg daily.  5. Aspirin.  6. Long acting Metoprolol or Toprol XL 100 mg daily or Lopressor 50 mg      b.i.d.  7. His Klonopin with be 0.5 mg at  night.   He will have very careful and early followup in the Coumadin clinic and  with me in the office to reassess his rhythm and his overall status.     Luis Abed, MD, [facility]  Electronically Signed    JDK/MedQ  DD: 12/17/2006  DT: 12/17/2006  Job #: [id number]

## 2010-12-25 NOTE — Letter (Signed)
December 09, 2007    Luis Abed, MD, [facility]  [address]  [address]  [place], [place] [address]   RE:  ALEXI, GEIBEL  MRN:  [id number]  /  DOB:  Aug 14, 1940   Dear Trey Paula:   It was a pleasure to see Mr. Mathe at your request because of recurrent  syncope.   As you know, he is a 70 year old gentleman with a history of ischemic  heart disease with prior bypass surgery about a year ago, normal left  ventricular function, some degree of sinus bradycardia with heart rates  into the 40s and 50s, who had an episode of syncope over the weekend.  He has a history of antecedent syncope that occurred prior to his bypass  surgery.  These would occur infrequently, maybe once every year or so.  There were 2-3 episodes prior to his bypass surgery and then none since  April 2008 until last weekend.   These have all been associated with a stereotypical prodrome which is  characterized by nausea, lightheadedness, change in vision, ringing in  the years and in the recovery phase by diaphoresis, pallor and profound  fatigue.  The wife did not notice yawning or goose bumps.  On his most  recent episode, his blood pressure was taken after some time and it was  97 systolic.  He has a history of some palpitations which he describes  as hard heartbeats.  There are no rapid rates.   PAST MEDICAL HISTORY:  1. Notable for supraventricular tachycardia in the past.  2. Crohn's disease.  3. Anemia.  4. Hypertension.  5. History of a colon tumor.  6. DVT and pulmonary emboli for which he takes Coumadin and has an      implanted filter. 7.  Question of sleep apnea.   PAST SURGICAL HISTORY:  Notable in addition to the above for  cholecystectomy and bypass.   REVIEW OF SYSTEMS:  In addition to the above is broadly negative apart  from anxiety.  He also has a history of drug reactions to multiple  different drugs.   CURRENT MEDICATIONS:  1. Humira injected twice a month for his Crohn's.  2. Benadryl.  3.  Zantac.  4. Prednisone prior to it as well.  5. Metoprolol 25 h.s.  6. He also takes clonazepam.  7. Coumadin.  8. Folic acid.  9. Aspirin.  10.Simvastatin.   ALLERGIES:  1. METHONIDAZOLE.  2. ASACOL.  3. REMICADE.  4. CODEINE.  5. Issues of rashes on AMIODARONE and DILTIAZEM.   SOCIAL HISTORY:  He is married.  He has 2 children and 1 grandchild.  He  does not use cigarettes, alcohol or recreational drugs.  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   PHYSICAL EXAMINATION:  GENERAL:  He is an elderly Caucasian male  appearing his stated age.  He was in no acute distress.  VITAL SIGNS:  Blood pressure today was 137/73 with a pulse of 51 without  orthostatic changes noted in your visit yesterday.  SKIN:  Warm and dry.  HEENT:  Demonstrated no icterus or xanthoma.  NECK:  Veins were flat.  The carotids were brisk and full bilaterally  without bruits.  BACK:  Without kyphosis, scoliosis.  LUNGS:  Clear.  HEART:  Sounds were regular without murmurs or gallops.  ABDOMEN:  Soft with active bowel sounds without midline pulsation or  hepatomegaly.  EXTREMITIES:  Femoral pulses were 2+, distal pulses were  intact.  There  is no clubbing, cyanosis or edema.  NEUROLOGIC:  Grossly normal.   DIAGNOSTICS:  Electrocardiogram dated yesterday demonstrated sinus  rhythm at 51 with intervals of 0.19, 0.10, 6.45 with a QTC of 0.42.  The  axis was leftward at -30 degrees.   IMPRESSION:  1. Recurrent syncope, probably neurally mediated.  2. Modest resting bradycardia with question of chronotropic      competence.  3. Coronary artery disease with:      a.     Prior bypass surgery, April 2009.      b.     Normal left ventricular function.  4. Crohn's disease.  5. Anemia related to number 4.   Trey Paula, Mr. Hanrahan has recurrent syncope that has a story most consistent  with a neurally mediated episode.  His bradycardia while concerning is  not likely the culprit as this tends to  have a very much abrupt onset  and abrupt offset loss of consciousness.  However, it may well be that  his bradycardia is going to be an issue either at baseline or  chronotropic competence.  I have suggest that we try to talk to Hol[facility]  lab to see we can get his recorder changed from an event recorder to a  continuous recorder.   I mentioned to the family that given his prodrome which is quite  stereotypical, that we may be able to avoid further episodes by lying  down with the onset of symptoms.  Given this, I would probably hold off  on implanting a loop recorder for right now to see if we can abrogate  his symptoms without the need for an invasive procedure.  He was  understanding of this and agreeable with this.   He is to follow up with you as previously scheduled.  I would be glad to  see him again at your request.    Sincerely,      Duke Salvia, MD, [facility]  Electronically Signed    SCK/MedQ  DD: 12/09/2007  DT: 12/09/2007  Job #: [id number]   CC:    Julieanne Manson

## 2010-12-25 NOTE — Assessment & Plan Note (Signed)
[facility]                            CARDIOLOGY OFFICE NOTE   NAME:RICHZayvier, Caravello                           MRN:          [id number]  DATE:12/24/2006                            DOB:          11-Sep-1940    Mr. Stratmann returns today after his bypass surgery. He is doing very well.  He had a CABG times 5 with a LEMA to the LAD, SVG to first diagonal,  sequential SVG to the ramus, and circumflex marginal, and an SVG to the  third posterolateral branch. He had endoscopic vein harvest from the  right thigh and right lower leg. Surgery was done by Dr. Tressie Stalker.  The patient did very well. There was very careful attention to all of  his issues including placing him back on Coumadin carefully after his  procedure. The patient did have postoperative atrial fibrillation. I was  quite concerned about this.  Amiodarone was used and there was question  that he had a rash. Ultimately the amiodarone was stopped and he went  home with rate control. Fortunately he has reverted on his own to sinus  rhythm and we will adjust his medicines backwards. He is here with his  son today. His wife has another appointment.   PAST MEDICAL HISTORY:   ALLERGIES:  METRONIDAZOLE, ASACOL, REMICADE, AND PURINETHOL.   CURRENT MEDICATIONS:  1. Clonazepam.  2. Vitamin B injections monthly.  3. Vitamin.  4. Fish oil.  5. Coumadin in our Coumadin clinic.  6. Lasix 40 daily.  7. Humira to be restarted today.  8. Folic acid.  9. AcipHex.  10.Lisinopril 5. He had been on 10 mg daily. I had lowered it to 5 mg      as we started other medicines during his hospitalization for his      atrial fibrillation rate control.  11.Zantac.  12.Potassium 20 mEq daily.  13.Aspirin 81 mg. This is new. He has a new diagnosis of coronary      disease. We are very aware of his GI status and some bleeding in      the past. For now we will try to see if we can be successful with      Coumadin and low  dose aspirin.  14.Lopressor extended release 100 mg daily. This will be cut to 50 mg.  15.Digoxin (this will be stopped today).  16.Diltiazem 240. We will check with his wife and see if this can be      cut in half. His blood pressure is low today.   OTHER MEDICAL PROBLEMS:  See the extensive problem list from numbers 1-  17 on my note of November 26, 2006.   REVIEW OF SYSTEMS:  He feels somewhat weak after his surgery. However,  he is doing well overall and he looks quite good. Review of systems  otherwise is negative.   PHYSICAL EXAMINATION:  His weight is 198 pounds. Blood pressure today is  low for him at 104/56. Pulse is 57. The patient is here with his son  today. He is oriented to person, time, and  place. Affect is normal.  HEENT: Reveals no xanthelasma. He has normal extraocular motion.  He has no carotid bruits. There is no jugular venous distension.  LUNGS: Clear. Respiratory effort is not labored.  CARDIAC EXAM: Reveals an S1 with an S2. There are no clicks or  significant murmurs. His chest is nicely healed post CABG.  ABDOMEN: Soft. There are normal bowel sounds.  There is slight swelling in his right leg (vein harvest leg).   EKG: Reveals sinus rhythm. This is excellent in that his atrial  fibrillation broke sometime after he left the hospital.   IMPRESSION:  Problems are listed numbers 1-17 on my note of November 26, 2006 and reviewed very carefully.   Problem number 16. Chest discomfort leading to a diagnosis of coronary  artery disease, leading to coronary artery bypass graft from which he is  now recovering and quite stable. Chest x-ray is pending, as we could not  do it here in the office today; it will be done at the [place] office later  today.   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 We have information from Dr. Sherrlyn Hock  about this, and the patient must remain on Coumadin unless it is  absolutely contraindicated.   Postoperative paroxysmal atrial fibrillation and  flutter. Fortunately he  converted on his own.   History of a rash in the past from Cipro and Flagyl and he later  received Cipro. Most recently he had a rash in the hospital shortly  after the beginning of his amiodarone. He had hives on his legs.  Previously he had had hives from other reactions on his whole body. We  will assume it is from amiodarone, but this is not proven.   Lopressor long acting will be cut to 50 mg daily, digoxin will be  stopped, lisinopril will be kept at 5 mg, and we will  find out about  his diltiazem dose to see if it can be lowered, if it is not a capsule.  I will see him for follow up in 4 weeks. We will arrange a way for his  blood pressure to checked to see if any other changes need to be made.     Luis Abed, MD, [facility]     JDK/MedQ  DD: 12/24/2006  DT: 12/24/2006  Job #: [id number]   cc:   Julieanne Manson

## 2010-12-25 NOTE — Assessment & Plan Note (Signed)
[facility]                            CARDIOLOGY OFFICE NOTE   NAME:Chermak, GREYSIN MEDLEN                         MRN:          [id number]  DATE:12/30/2007                            DOB:          Dec 11, 1940    I saw Mr. Bruins last on December 08, 2007.  I had been concerned about his  syncopal episodes.  See my very extensive note.  I referred him to Dr.  Graciela Husbands to see if we should place a loop recorder.  I have a very nice  note from Dr. Graciela Husbands.  He feels that the patient had recurrent syncope  that was probably neurally mediated.  There was a question of  chronotropic incompetence.  He felt that it could be that bradycardia  was playing a role.  He decided to change his monitor to a continuous  recorder instead of an event recorder.  That information is not yet  available to me today.  It will be reviewed later today.  In the  meantime he is feeling much better.  His metoprolol dose was changed  from 100 mg to 25 mg and lisinopril was stopped.  He has brought me his  blood pressures.  His pressure is ranging in the range of 138/78 with a  pulse in the range of 59-70.  He feels much better.  His wife is in  agreement that he is doing well.  He is still wearing his monitor.   PAST MEDICAL HISTORY:  Allergies - see the prior list.   MEDICATIONS:  1. Humira injection.  2. Colace.  3. Allegra.  4. Benadryl.  5. Zantac.  6. Metoprolol 25.  7. Clonazepam.  8. Vitamin B injection.  9. Fish oil.  10.Coumadin.  11.Folic acid.  12.Aspirin.  13.Prednisone around the time of his Humira shot.  14.Prilosec OTC.   OTHER MEDICAL PROBLEMS:  See the complete list on my note of December 08, 2007.   REVIEW OF SYSTEMS:  He is really feeling well and doing well.   PHYSICAL EXAM:  VITAL SIGNS:  Blood pressure is 145/81.  Heart rate is  62.  GENERAL:  The patient is oriented to person, time and place.  Affect is  normal.  He is here with his wife today.  HEENT:  Reveals  no xanthelasma.  He has normal extraocular motion.  NECK:  There are no carotid bruits.  There is no jugular venous  distention.  LUNGS:  Are clear.  Respiratory effort is not labored.  CARDIAC:  Exam reveals an S1 and an S2.  There are no clicks or  significant murmurs.  ABDOMEN:  The abdomen is soft.  EXTREMITIES:  He has no peripheral edema.   Problems are listed on my note of April 28.  Also, see the complete note  from Dr. Graciela Husbands on December 09, 2007.  The patient appears to have neurally  mediated syncope and some symptoms from lower blood pressure and lower  heart rate.  He is doing very well on the lower dose of metoprolol and  his lisinopril stopped.  We will be very careful not to allow his blood  pressure to get too low.  We will obtain the results of his monitors to  review his heart rate.  I will see him back to review everything again  in 6 weeks.  Also, we will call him later this afternoon.   ADDENDUM:  I have now reviewed the monitor strips from the monitors that  Mr. Fleek has been wearing with his Card[employer]Net event recorder.  He wore it  from April 9 to November 30, 2007.  During that time he had normal sinus  rhythm and sinus bradycardia.  He did have symptoms on various  occasions.  With each of these symptoms he had the same sinus  bradycardia that he had in general and that was unrevealing.  He did  have one episode of supraventricular tachycardia that was picked up with  an automatic evaluation.  There were 16 beats.  The signal is not clear.  This could be a burst of atrial fibrillation or another supraventricular  arrhythmia.  It is documented in the chart dated November 21, 2006.  The  patient has then been wearing a monitor that monitors his heart rate  continuously.  He continues to have sinus rhythm with sinus bradycardia.   As noted in the dictation done earlier today he is stable and feeling  better with his blood pressure medicines reduced.  At this point I   cannot add any further information with the monitor strips as outlined  above.     Luis Abed, MD, [facility]  Electronically Signed    JDK/MedQ  DD: 12/30/2007  DT: 12/30/2007  Job #: [id number]   cc:   Rosendo Gros

## 2010-12-25 NOTE — Assessment & Plan Note (Signed)
[facility]                            CARDIOLOGY OFFICE NOTE   NAME:Sabet, XAYVION SHIRAH                         MRN:          [id number]  DATE:02/08/2008                            DOB:          Mar 01, 1941    Mr. Hudspeth is doing well.  I saw him last on Dec 30, 2007.  He has  continued to do well since we lowered his beta-blocker dose.  He is  stable.  He had some very slight rectal bleeding.  He says that Dr.  Markham Jordan feels that this may be related to his Coumadin therapy.  However,  it is quite limited and he is stable.  We will not make any further  changes.   He is fortunately is doing well today.   ALLERGIES:  See the prior listing.   MEDICATIONS:  See the flow sheet.   REVIEW OF SYSTEMS:  He is feeling well and doing well.  He has some  generalized weakness in the hip, but otherwise he is doing well.   OTHER MEDICAL PROBLEMS:  See the complete list in the note of December 08, 2007.   PHYSICAL EXAMINATION:  VITAL SIGNS:  Blood pressure is 130/76 with a  pulse of 75.  GENERAL:  The patient is here with his wife today.  The patient is  oriented to person, time, and place.  Affect is normal.  HEENT:  No xanthelasma.  He has normal extraocular motion.  There are no  carotid bruits.  There is no jugular venous distention.  LUNGS:  Clear.  Respiratory effort is not labored.  CARDIAC:  An S1 with an S2.  There are no clicks or significant murmurs.  The patient has no significant peripheral edema.   Mr. Matherne is stable.  His problems are listed extensively in my note of  December 08, 2007 with the uptake concerning lowering his medications on  Dec 30, 2007.  He is stable.  I will see him in 3 months.     Luis Abed, MD, [facility]  Electronically Signed    JDK/MedQ  DD: 02/08/2008  DT: 02/09/2008  Job #: [id number]   cc:   Rosendo Gros

## 2010-12-25 NOTE — Cardiovascular Report (Signed)
NAMEXAIDEN, FLEIG                  ACCOUNT NO.:  [id number]   MEDICAL RECORD NO.:  [id number]          PATIENT TYPE:  OIB   LOCATION:  1965                         FACILITY:  [facility]   PHYSICIAN:  Veverly Fells. Excell Seltzer, MD  DATE OF BIRTH:  11-Apr-1941   DATE OF PROCEDURE:  02/09/2009  DATE OF DISCHARGE:  02/09/2009                            CARDIAC CATHETERIZATION   Risks and indication of the procedure were reviewed with the patient,  informed consent was obtained.   PROCEDURES:  Left heart catheterization, selective coronary angiography,  left ventricular angiography, left internal mammary artery angiography,  saphenous vein graft angiography.   INDICATIONS:  Mr. Molner is a 70 year old gentleman with multivessel  coronary artery disease, who presented with recurrent syncope last  month.  He had a profound episode where he was down for several minutes.  He was hospitalized for 23 hours and ruled out for myocardial  infarction.  He was discharged home as he was having no angina at that  time.  His initial presentation prior bypass surgery was somewhat vague.  Dr. Myrtis Ser knows the patient well and referred him for left heart  catheterization to evaluate for ischemic heart disease as an etiology of  his syncope.   Risks and indications of the procedure were reviewed with the patient.  Informed consent was obtained.  The right groin was prepped, draped, and  anesthetized with 1% lidocaine.  Using modified Seldinger technique, a 4-  French sheath was placed in the right femoral artery.  Standard 4-French  Judkins catheters were used for coronary angiography, bypass graft  angiography, and left ventriculography.  For bypass graft angiography, a  no-torque right catheter was used to image the LIMA graft and the  saphenous vein graft to first diagonal.  An LCB catheter was used for  the saphenous vein graft to sequence the left circumflex OM branches,  and an RCB catheter was used to image the  vein graft to posterolateral  branch.  The patient tolerated the procedure well.  All catheter  exchanges were performed over guidewire.  There were no immediate  complications.   FINDINGS:  Hemodynamics:  Aortic pressure 140/67 with a mean of 98, left  ventricular pressure 140/15.   Coronary angiography:  The left mainstem is patent.  There is 30% distal  left main stenosis.  The left main bifurcates into the LAD and left  circumflex.  The left main bifurcates into the LAD and left circumflex.   LAD:  There is ostial narrowing of the LAD in the range of 70-80%.  The  LAD gives off multiple diagonal branches, all of the diagonals are  diseased.  The first diagonal has significant ostial stenosis of 80-90%.  In that region of the LAD, there is a 75% calcified stenosis present.  The mid LAD fills competitively from LIMA flow.  There are no  significant stenoses throughout the remaining portions of the LAD or in  the lower diagonal branches.   Left circumflex:  The left circumflex is dominant.  The vessel was  severely diseased.  Left circumflex has irregularities throughout  the  proximal vessel with up to 50% stenosis.  The first and second OM  branches are totally occluded.  The AV groove circumflex courses down  where there is a 90% stenosis beyond the OM origin, and the vessel then  occludes distally.   Right coronary artery, RCA:  The RCA is codominant.  There is a 60%  proximal stenosis and a 50% mid stenosis.  Vessel supplies an RV  marginal branch and a small PDA branch.  It is a relatively small  vessel.   LIMA angiography:  The left internal mammary artery is widely patent  from its origin to its anastomosis in the mid LAD.   Saphenous vein graft angiography:  The saphenous vein graft to first  diagonal is widely patent.  There is a valve in the midportion of the  graft with mild diffuse irregularities but no significant stenoses.  The  diagonal target vessel was fairly  small.   Saphenous vein graft sequenced to first and second OM branches is patent  to both vessels.  The second OM branches into twin vessels.  There are  no significant stenoses throughout the vein graft.   Saphenous vein graft to left posterolateral branch is patent.  This  supplies the remaining portions of the circumflex in retrograde fashion  and supplies multiple posterolateral subbranches as well as a left PDA.  The vein graft is widely patent without significant irregularities.   Left ventriculography:  LV function is normal.  The LVEF is estimated at  55-60%.   ASSESSMENT:  1. Native three-vessel coronary artery disease.  2. Status post coronary artery bypass graft with 5/5 grafts patent.  3. Normal left ventricular function.   PLAN:  Recommend ongoing medical therapy with precautions to stay well  hydrated and avoid heat to prevent recurrent syncope.      Veverly Fells. Excell Seltzer, MD  Electronically Signed     MDC/MEDQ  D:  02/09/2009  T:  02/10/2009  Job:  [id number]   cc:   Luis Abed, MD, [facility]  Richard Sullivan Lone

## 2010-12-26 ENCOUNTER — Ambulatory Visit (INDEPENDENT_AMBULATORY_CARE_PROVIDER_SITE_OTHER): Payer: Medicare Other | Admitting: Emergency Medicine

## 2010-12-26 DIAGNOSIS — I2699 Other pulmonary embolism without acute cor pulmonale: Secondary | ICD-10-CM

## 2010-12-26 DIAGNOSIS — Z7901 Long term (current) use of anticoagulants: Secondary | ICD-10-CM

## 2010-12-28 NOTE — Assessment & Plan Note (Signed)
[facility]                            CARDIOLOGY OFFICE NOTE   NAME:[name] [name], [name] [name]                         MRN:          [id number]  DATE:09/22/2006                            DOB:          1940-10-25    DATE OF VISIT:  September 22, 2006   REASON FOR VISIT:  [name] [name] is here for cardiology followup.  He felt  palpitations last week.  He did not have any syncope or presyncope. He  has had some mild dizziness and this has been a chronic problem.  Holter  monitor was placed by Dr. Sullivan Lone and we do not yet have those results  as it will be scanned today.  We will request those results as soon as  they are available.   The patient has not had any chest pain.  He has not had any significant  shortness of breath.  Since seeing him last, Lexapro has been added to  his medications.  He remains on a diuretic.   The patient is on Humira for his Crohn's disease.   PAST MEDICAL HISTORY:   ALLERGIES:  METRONIDAZOLE, ASACOL, REMICADE, PURINETHOL.   MEDICATIONS:  1. Lexapro 10.  2. Clonazepam.  3. Protonix.  4. Vitamin B injections monthly.  5. Multivitamin.  6. Fish oil.  7. Calcium, vitamin D.  8. Coumadin.  9. Toprol XL.  10.Lasix 40.  11.Humira.   OTHER MEDICAL PROBLEMS:  See the extensive list below.   REVIEW OF SYSTEMS:  In general, he is doing relatively well, other than  the issues mentioned in the HPI, and otherwise his review of systems is  negative.   PHYSICAL EXAMINATION:  VITAL SIGNS:  Blood pressure today is 160/80.  His pressure has not been high, at the time of his last visit, and his  pressure needs to be followed.  Heart rate is 48.  Weight is 204 pounds  which is 1 pound higher than in October of 2007.  GENERAL APPEARANCE:  The patient is oriented to person, time and place.  Affect is normal although he seems mildly depressed.  His wife is  present at the time of the evaluation.  HEENT:  Reveals normal extraocular motion.  NECK:  He has no carotid bruits.  He has no jugular venous distention.  LUNGS:  Are clear.  Respiratory effort is not labored.  CARDIAC:  Exam reveals an S1 with an S2.  ABDOMEN:  Soft.  There are no masses or bruits. He has a scar that is  old and healed.  EXTREMITIES:  He has no significant peripheral edema today.  There are  2+ distal pulses.  MUSCULOSKELETAL:  He has no major musculoskeletal deformities.   CLINICAL DATA:  EKG reveals sinus bradycardia.   PROBLEMS:  1. History of syncope on two or three occasions in the past.  This was      felt to be related to when he was ill with Crohn's disease or      possible orthostasis.  There may have been an allergic type      reaction to Remicade.  It is also possible that he had pulmonary      emboli at that time, although at that time, no pulmonary emboli      were seen by CT scan.  2. Crohn's disease.  This has been a significant ongoing problem and      he is being treated.  3. History of some orthostasis over time.  4. Brief episode of some supraventricular tachycardia during one of      his admissions.  He currently has had some palpitations.  We will      obtain the Holter results from Dr. Sullivan Lone and then I will see the      patient back for early followup.  We will not adjust his medicines      as of today.  5. History of hypertension.  His systolic pressure is a little higher      today and this will need followup.  6. History of anemia from his Crohn's.  7. Steroid use over time, although it appears he is not on any at this      time.  8. Status post cholecystectomy.  9. History of a colon tumor.  10.Shortness of breath.  Volume was playing a role and this has      stabilized and he remains on a diuretic.  We will check on the      outside labs that have been done and if he has not had one, we will      check a BMET when he is here next.  11.Coumadin therapy for his pulmonary emboli.  12.Inferior vena cava filter that  was placed at the time of his      pulmonary emboli.  13.Question of sleep apnea.  14.Ejection fraction in the 50 to 55% range by echocardiogram in      [place] in the past.  15.Significant deep venous thrombosis and pulmonary emboli that was      documented in the Spring of 2007.  16.Treatment with Humira for his Crohn's disease.  17.Recent palpitations.   PLANS:  We will obtain blood studies that are available.  We will obtain  the Holter monitor results from Dr. Elisabeth Cara office and will see him  back in approximately 3 weeks for followup.     Luis Abed, MD, [facility]  Electronically Signed   JDK/MedQ  DD: 09/22/2006  DT: 09/22/2006  Job #: [id number]   cc:   Julieanne Manson

## 2010-12-28 NOTE — Consult Note (Signed)
[name], [name]                  ACCOUNT NO.:  [id number]   MEDICAL RECORD NO.:  [id number]          PATIENT TYPE:  OIB   LOCATION:  1967                         FACILITY:  [facility]   PHYSICIAN:  [name]. [name], [name] DATE OF BIRTH:  1941/02/19   DATE OF CONSULTATION:  12/03/2006  DATE OF DISCHARGE:  12/01/2006                                 CONSULTATION   REQUESTING PHYSICIAN:  Luis Abed, MD, [facility]   REASON FOR CONSULTATION:  Severe 3-vessel coronary artery disease.   HISTORY OF PRESENT ILLNESS:  [name] [name] is a 70 year old retired gentleman  from [place], with multiple medical problems but no previous cardiac  history.  Cardiac risk factors are notable for a history of  hypertension, remote history of tobacco use, and a family history of  coronary artery disease.  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  The patient also has history of Crohn's disease and obstructive sleep  apnea.  He had a IVC filter placed following his pulmonary embolus in  April2007.  He remains on chronic Coumadin therapy and was told he will  need to remain on Coumadin for the rest of his life.   [name] [name] was in his usual state of health until approximately 3 weeks  ago.  He states that he had a prolonged episode of substernal chest  discomfort that he attributed to indigestion.  This discomfort lasted  several hours, and ultimately he suffered a syncopal episode.  He was  seen by his gastroenterologist, Dr. Lynnae Prude, and he was  subsequently referred for neurology and cardiology consultation.  Mr.  [name] apparently was evaluated by Dr. Tresa Endo in [place] and underwent  an MRI of the brain and an EEG.  By report, both of these tests were  normal.  The patient was subsequently seen by  Dr. Myrtis Ser on [address] [address]la.  He  was placed on a CardioNet monitor, had a 2-D echocardiogram, and  subsequently underwent a stress Myoview exam.  The echocardiogram was  notable for normal left ventricular function, and the Myoview scan was  notable for lateral wall ischemia.   [name] [name] subsequently underwent elective cardiac catheterization by Dr.  [name] earlier this week.  He was found to have severe 3-vessel  coronary artery disease with normal left ventricular function.  He has  now been referred for possible surgical revascularization.   REVIEW OF SYSTEMS:  GENERAL:  The patient reports he has had a tough  time, dating back to his pulmonary embolus in April 2007.  Recently his  appetite has been stable.  He has not been gaining or losing weight.  He  is 5 feet 11 inches tall and weighs approximately 200 pounds.  He does  admit to exertional fatigue.  CARDIAC:  The patient reports a several-  week history of intermittent episodes of mild substernal chest  discomfort that he has attributed  to indigestion.  The most severe  episode was that associated with syncope approximately 3 weeks ago.  He  now admits that he has had episodes since then, although they have been  transient and short lived.  Most recently, he had a brief episode this  weekend prior to his heart catheterization.  He has not tried taking any  of the nitroglycerin capsules that Dr. Myrtis Ser prescribed.  His episodes of  chest discomfort are described as somewhat burning substernal pain,  without radiation.  They seem to come and go sporadically and are not  necessarily associated with meals or physical activity.  The patient  also reports progressive symptoms of exertional shortness of breath.  He  gets short of breath with relatively mild activity.  He has occasional  palpitations.  He had a syncopal episode 3 weeks ago, and in retrospect  he apparently had syncopal episodes in the past that have always been  attributed  to possible allergic reactions.  The patient has lower  extremity edema, much more so on the left than the right; that dates  back to his previous deep venous thrombosis.  He denies PND, orthopnea.  RESPIRATORY:  Notable for the absence of productive cough, hemoptysis,  wheezing.  GASTROINTESTINAL:  The patient had intermittent mild crampy  abdominal discomfort related to his Crohn's disease.  This has been much  more quiescent recently and he has not had much trouble for the last 8-  10 months.  He has had some mild hematochezia in the distant past, but  none recently.  He denies any difficulty swallowing.  He denies any  hematemesis.  GENITOURINARY:  Negative.  PERIPHERAL VASCULAR:  Negative  with respect to symptoms of claudication.  The patient does still have  swelling of left lower leg that goes back to his history of deep venous  thrombosis.  NEUROLOGIC:  Notable for syncopal episode.  The patient  denies any history of known seizures.  MUSCULOSKELETAL:  Negative.  PSYCHIATRIC:  Negative.  HEMATOLOGIC:  Notable for clotting diathesis as  described.  HEENT:  Negative.   PAST MEDICAL HISTORY:  1. Hypertension.  2. Crohn's disease.  3. Hypercoagulable state on chronic Coumadin therapy.  4. Pulmonary embolus, April2007.  5. Deep venous thrombosis left lower leg, April2007.  6. IVC filter placement, April2007  7. Obstructive sleep apnea.  8. Crohn's disease.  9. Benign colon polyp.   PAST SURGICAL HISTORY:  1. Cholecystectomy.  2. Back surgery x2.  3. Cataract extraction x2.  4. Colonoscopic polypectomy.  5. IVC filter placement, April2007.   FAMILY HISTORY:  The patient's father died of heart attack at age 75.   SOCIAL HISTORY:  The patient is married and lives with his wife in  [place].  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  They have 2 grown children.  He has remote history of tobacco use, but he quit smoking at age 79.   He denies  regular alcohol consumption.   CURRENT MEDICATIONS:  1. Aciphex 20 mg every morning.  2. Metoprolol 25 mg twice daily.  3. Lisinopril 10 mg daily.  4. Lasix 40 mg daily.  5. Clonazepam 0.5 mg every morning.  6. Zegerid 40/1100 mg one tablet 30 minutes before supper.  7. Folic acid 1 mg daily.  8. Coumadin 5 mg alternating with 4 mg every other day.  9. Colace 100 mg daily.  10.Vitamin B12 injection once monthly.  11.Humira injection every other week.  12.Prednisone 40 mg twice daily on the day of Humira injection, with      Allegra 180 mg, Benadryl 25 mg and Zantac 300 mg as well.  13.Multivitamin 1 tablet daily.  14.Fish oil capsules 2 daily.  15.Caltrate with vitamin D once daily.  16.Tylenol as needed for headaches.  17.Dramamine as needed for nausea and dizziness.  18.Potassium chloride 20 mEq daily  19.Aspirin 325 mg daily, enteric-coated aspirin.   DRUG ALLERGIES:  FLAGYL, CODEINE, REMICADE, METHOTREXATE, ASACOL.   PHYSICAL EXAMINATION:  GENERAL:  The patient is a well-appearing male  who appears his stated age and in no acute distress.  VITAL SIGNS:  Blood pressure 124/70, pulse 67, oxygen saturation 95% on room air.  HEENT:  Exam is unrevealing.  NECK:  Supple.  There is no cervical or supraclavicular lymphadenopathy.  There is no jugular venous distension.  No carotid bruits are noted.  CHEST:  Auscultation of the chest demonstrates clear and symmetrical  breath sounds bilaterally.  No wheezes or rhonchi are noted.  CARDIOVASCULAR:  Exam includes regular rate and rhythm.  No murmurs,  rubs or gallops are appreciated.  The abdomen is soft, mildly obese,  nondistended and nontender.  There are no palpable masses.  Bowel sounds  are present.  EXTREMITIES:  Warm and adequately perfused.  There was mild to moderate  left lower extremity edema and mild right lower extremity edema.  There  was some changes of mild chronic venous insufficiency.  There are no   open skin lesions or ulcerations.  Distal pulses are diminished in both  lower legs, but palpable.   RECTAL AND GU:  Exams both deferred.  NEUROLOGIC:  Examination is grossly nonfocal and symmetrical throughout.   DIAGNOSTIC TESTS:  Cardiac catheterization performed by Dr. Excell Seltzer on  Donnie Aho is reviewed.  This demonstrates severe 3-vessel coronary artery  disease with normal left ventricular function.  There was left dominant  coronary circulation.  There was 40-50% stenosis of the distal left main  coronary artery, with 70% ostial stenosis of the left anterior  descending coronary artery with irregular plaque.  There was 80% ostial  stenosis of a ramus intermediate branch.  There was 80-90% stenosis of  the mid left circumflex coronary artery, at the takeoff of a circumflex  marginal branch.  There was 100% occlusion of the circumflex marginal  branch.  The distal left circumflex coronary artery gives rise to 4 posterolateral branches, one of which is large and the dominant branch  on the inferior wall.  There was 80-90% stenosis of the mid left  anterior descending coronary artery, with 80-90% ostial stenosis of the  first diagonal branch.  The right coronary artery was small and  nondominant, and difficult to visualize on catheterization films  obtained.  The left ventricular function appears normal.   IMPRESSION:  Severe 3-vessel coronary artery disease, with left dominant  coronary circulation and normal left ventricular function.  [name] [name]  presents with intermittent symptoms of chest pain that are somewhat  atypical, but somewhat unstable as well; and associated with a prolonged  episode of chest pain 3 weeks ago that culminated in a syncopal event.  I believe that we should presume that his symptoms are angina until  proven otherwise.  [name] [name] also has underlying hypercoagulable state  and remains on long-term Coumadin therapy.  He has history of deep  venous thrombosis in the  left lower extremity, with pulmonary embolus in  April2007.  He also has longstanding history  of Crohn's disease,  although symptoms have been reasonably controlled recently on medical  therapy.  He did have one episode of partial bowel obstruction in 2006.   RECOMMENDATIONS:  I believe that [name] [name] would best be treated with  coronary artery bypass grafting.  He will undoubtedly be at somewhat  increased risk for a variety of postoperative complications.  Given his  somewhat unstable symptoms and his underlying hypercoagulable state,  I  would favor admitting him for intravenous heparin to bridge him off his  Coumadin therapy prior to surgery, and we are reluctant to just stop the  Coumadin as an outpatient.   PLAN:  I will discuss this case at length with Dr. Myrtis Ser and hopefully  plan for hospital admission in the near future.  We can tentatively plan  to proceed with surgery on Wednesday, April30, 2008.  We will also plan  to discuss management of Crohn's disease medications with his  gastroenterologist, Dr. Lynnae Prude.  Finally, we will also obtain  records from Dr. Sherrlyn Hock to more carefully characterize the patient's  underlying hypercoagulable state.      [name]. [name], [name]  Electronically Signed     CHO/MEDQ  D:  12/03/2006  T:  12/04/2006  Job:  [id number]   cc:   Veverly Fells. Excell Seltzer, MD  Deneise Lever, [place] Dr. Sherrlyn Hock,  Luis Abed, MD, [facility]  Gregary Signs

## 2010-12-28 NOTE — Assessment & Plan Note (Signed)
[name]                            CARDIOLOGY OFFICE NOTE   NAME:[name] [name]                         MRN:          [id number]  DATE:11/13/2006                            DOB:          May 12, 1941    REFERRING PHYSICIAN:  Julieanne Manson   Cardiologist is Dr. Willa Rough.   HISTORY OF PRESENT ILLNESS:  [name] [name] is a very pleasant 70 year old  male patient followed by Dr. Myrtis Ser with a history of syncope on a couple  of occasions in the past, as well as SVT, on Coumadin therapy secondary  to DVT and pulmonary emboli, who presents to the office today for  further evaluation of chest pain and syncope.  The patient has a long  history of Crohn's disease.  Last week after eating fried fish he woke  up in the middle of the night with chest discomfort he described as  indigestion.  There was no radiating symptoms or shortness of breath.  He felt nauseated, and then felt lightheaded.  He thinks he had a  syncopal episode because he awoke and had lost control of his bladder.  He saw his gastroenterologist in preparation for a colonoscopy next  week.  When he told him of his symptoms, he decided the patient should  see his neurologist as well as his cardiologist.  The patient has  already seen Dr. Tresa Endo at [facility].  An MRI has been ordered as  well as an EEG.  The results are pending.  He notes that since this  episode last week he has felt basically well.  He does note occasional  episodes of brief nausea and lightheadedness, but no further syncope or  chest pain.  He does have chronic shortness of breath with exertion  since his pulmonary emboli were diagnosed a little over a year ago.  He  notes that there has been no change there.  Denies any orthopnea or  paroxysmal nocturnal dyspnea.  He does have chronic lower extremity  edema since his DVT.   CURRENT MEDICATIONS:  1. Clonazepam 25 mg nightly.  2. Vitamin B12 injection.  3. Centrum  Silver.  4. Fish oil.  5. Calcium plus vitamin D.  6. Coumadin as directed.  7. Toprol XL 25 mg b.i.d.  8. Lasix 40 mg daily.  9. Humira.  10.Folic acid.  11.AcipHex 20 mg.  12.Lisinopril 10 mg daily.  13.Zegerid nightly.  14.Folic acid.  15.Prednisone.  16.Dramamine p.r.n.  17.Colace p.r.n.  18.Nitroglycerin p.r.n.  19.Tylenol p.r.n.  20.Milk Of Magnesia p.r.n.  21.Allegra p.r.n.  22.Benadryl p.r.n.  23.Zantac p.r.n.   ALLERGIES:  1. METRONIDAZOLE.  2. ASACOL.  3. REMICADE.  4. PURINETHOL.  5. CODEINE.   REVIEW OF SYSTEMS:  Please see HPI.  Denies any fevers, chills, cough.  No hematochezia, hematuria, dysuria.  The review of systems are  negative.   PHYSICAL EXAMINATION:  He is well nourished, well developed, in no acute  distress.  ORTHOSTATIC VITAL SIGNS:  Blood pressure lying 136/79, pulse of 76,  sitting 139/79 with a pulse of 64, standing 144/77 with  a pulse of 56.  After 10 minutes 144/78 with a pulse of 54.  After 5 minutes 138/76 with  a pulse of 55.  Weight 201 pounds.  HEENT:  Unremarkable.  NECK:  Without JVD.  CARDIAC:  S1 and S2.  Heart regular rate and rhythm without murmurs.  LUNGS:  Clear to auscultation bilaterally without wheezing, rhonchi, or  rales.  ABDOMEN:  Soft and non-tender.  Normoactive bowel sounds.  No  organomegaly.  EXTREMITIES:  With trace to 1+ edema bilaterally.  Calves are soft and  non-tender.  SKIN:  Warm and dry.  NEUROLOGIC:  Alert and oriented x3.  Cranial nerves 2 through 12 grossly  intact.  Carotids without bruits bilaterally.  Electrocardiogram reveals sinus bradycardia with a heart rate of 56,  left axis deviation.  No significant change since previous tracings.   IMPRESSION:  1. Chest pain.  2. Questionable syncope.  3. History of syncope on 3 occasions in the past felt to be related to      illness related to Crohn's disease or possible orthostasis.  4. Nonischemic Myoview December 2006.  5. Good left  ventricular function with an ejection fraction of 50% to      55% by echocardiogram September 2006.  6. History of supraventricular tachycardia as well as palpitations.      a.     Recent negative Holter monitor done in [place] by his       primary care physician.  7. Hypertension.  8. History of anemia secondary to Crohn's disease.  9. Status post cholecystectomy.  10.History of colon tumor.  11.Coumadin therapy secondary to deep vein thrombosis/pulmonary      emboli.      a.     Status post IVC filter.  12.Questionable history of sleep apnea.  13.Humira therapy secondary to Crohn's disease.   PLAN:  The patient presents to the office today with complaints of chest  pain, and a possible episode of syncope.  Symptoms of chest pain are  somewhat atypical.  He describes symptoms that sound almost consistent  with a vagal episode.  In any event, I think it is important that we  screen him for coronary disease.  He will be set up for an adenosine  Myoview as soon as possible.  We will also set him for an echocardiogram  to assess his LV function further in the setting of recent possible  syncope.  He did have a recent monitor secondary to palpitations.  However, at that time he was not having any symptoms of syncope or near  syncope.  His symptoms have definitely changed, and I think it is  important that we place another monitor on him.  I have recommended that  we go ahead and place him on a CardioNet event monitor.  We will check  some labs with a CMET and CBC today as well.  I think it is important  that we postpone his colonoscopy until we have the above results for  further review by Dr. Myrtis Ser.  I will bring the patient back in close  followup with Dr. Myrtis Ser in the next 1 to 2 weeks.  He knows if he  develops any recurrent symptoms of chest pain like what he had the other  night, he should go directly to the emergency room.  The patient is also pending results from his MRI and  EEG.  I have asked that we get those  results sent to [place] as well.  Again,  he will follow up closely with Dr.  Myrtis Ser in the next 1 to 2 weeks.      Tereso Newcomer, PA-C  Electronically Signed      Arturo Morton. Riley Kill, MD, [facility]  Electronically Signed   SW/MedQ  DD: 11/13/2006  DT: 11/13/2006  Job #: [id number]   cc:   Lauro Regulus, MD

## 2010-12-28 NOTE — Assessment & Plan Note (Signed)
[facility]                            CARDIOLOGY OFFICE NOTE   NAME:Malczewski, NICKOLI BAGHERI                         MRN:          [id number]  DATE:10/20/2006                            DOB:          1940/08/27    Mr. Ou is seen for followup today. He is doing better. He has not had  any recurring palpitations. I have received the monitor from Dr. Sullivan Lone  and it clearly looks quite stable. There is no significant episodes of  arrhythmias, either bradycardia or tachy arrhythmias. He had some  shortness of breath, but this also is somewhat improved for him. He has  not had any chest pain. He has not had any true pre-syncope or syncope.   PAST MEDICAL HISTORY:   ALLERGIES:  1. METRONIDAZOLE.  2. ASACOL.  3. REMICADE.  4. PURINETHOL.   CURRENT MEDICATIONS:  1. Lexapro.  2. Clonazepam.  3. Protonix.  4. Vitamin B injections.  5. Vitamins.  6. Fish oil.  7. Coumadin.  8. Toprol.  9. Lasix.  10.Humira.  11.Folic acid.   OTHER MEDICAL PROBLEMS:  See the complete list on my note of September 22, 2006.   REVIEW OF SYSTEMS:  See the HPI. Otherwise, review of systems is  negative. The patient is bothered by fatigue and this is nonspecific.   PHYSICAL EXAMINATION:  Weight is 202 pounds. Blood pressure is 140/77  with a pulse of 51.  The patient is oriented to person, time and place  and affect is normal.  He has a blushing to his facial skin.  There is no xanthelasma. There is normal extraocular motion. There are  no carotid bruits. There is no jugular venous distention.  LUNGS:  Are clear. Respiratory effort is not labored.  CARDIAC: Reveals an S1, with an S2. There are no clicks or significant  murmurs.  ABDOMEN: Soft. There are no masses or bruits.  There is no peripheral edema.   Holter monitor as discussed above. The patient also had labs through Dr.  Sullivan Lone that I have reviewed from late January and his hemoglobin was  13; BUN and creatinine  were normal.   PROBLEM LIST:  Problems are listed number 1-17 on my note of September 22, 2006.  1. Recent palpitations. This is stable.  2. Overall fatigued. We will check a TSH and send a copy to Dr.      Sullivan Lone.     Luis Abed, MD, [facility]  Electronically Signed    JDK/MedQ  DD: 10/20/2006  DT: 10/20/2006  Job #: [id number]   cc:   Julieanne Manson

## 2010-12-28 NOTE — Assessment & Plan Note (Signed)
[facility]                            CARDIOLOGY OFFICE NOTE   NAME:Sample, [name] [name]                         MRN:          [id number]  DATE:12/05/2006                            DOB:          06-14-1941    I saw [name] last November 26, 2006 and a cardiac catheterization was  arranged. He had severe disease and he had seen Dr. Tressie Stalker in  consultation on December 01, 2006. Since that time, I have spoken with Dr.  [name]. In addition, I have spoken with Dr. Janese Banks of the  Department of Hematology Oncology at the [facility].   [name] is scheduled for cardiac surgery on Wednesday April 30. He is  stable. There has been careful review concerning his coagulation status.  After his catheterization on Monday, April 21, he started back on his  Coumadin and has taken Coumadin on Monday, Tuesday, Wednesday and  Thursday of this week. This morning he is here and his INR is 1.2. We  are making decisions to put him on Lovenox until he goes for surgery.   See my extensive problem list on my note of November 26, 2006.  1. Severe coronary disease with need for CABG.  2. Factor VIII excess. This is felt to be a hypercoagulable state for      the patient.   Mr. Bontempo had the deep venous thrombosis of the left lower extremity and  pulmonary embolus in April 2007. He did have an IVC filter placed at  that time because of some GI bleeding in the past. Fortunately he has  been able to tolerate Coumadin since then. The patient was evaluated  very nicely by Dr. Sherrlyn Hock of the [facility]. Over time, Dr. Sherrlyn Hock found that [name] Factor  VIII levels revealed 375% Factor VIII activity and 407% antigen level.  This is at a time when his C reactive protein was only 2.0 and his  fibrinogen was normal at 469. He therefore feels that this is a  hypercoagulable situation and he recommended that the patient remain on  Coumadin indefinitely unless he were to have an absolute  contraindication to Coumadin. With this in mind, he felt that protecting  him while is off Coumadin until the time of his heart surgery was  appropriate. [name] therefore is here for this assessment today.   With his INR at 1.2, I have decided carefully with the patient and his  wife to put him on full dose Lovenox coverage with his last dose to be  the evening dose of Monday, April 28. He will therefore be completely  off all day Tuesday the 29th and he will have his surgery on Wednesday.  I will be speaking with Dr. [name] about the patient's admission to the  hospital.   [name] is stable today.     Luis Abed, MD, [facility]  Electronically Signed   JDK/MedQ  DD: 12/05/2006  DT: 12/05/2006  Job #: [id number]   cc:   [name]. [name], [name]

## 2010-12-28 NOTE — Assessment & Plan Note (Signed)
[facility]                              CARDIOLOGY OFFICE NOTE   NAME:[name], [name] [name]                         MRN:          [id number]  DATE:05/12/2006                            DOB:          07/10/1941    [name] [name] returns for followup.  He is doing well.  See my extensive note  dated December 04, 2005.  At that time we began using Lasix.  He responded well  and since that time Dr. Sullivan Lone has increased the dose.  He still has mild  edema but he is doing well.  He has chronic shortness of breath.  However,  this appears to be stable.  I do not think that there is a cardiac basis for  this at this time.   PAST MEDICAL HISTORY:  Allergies:  METRONIDAZOLE, ASACOL, REMICADE,  PURINETHOL.   Medications:  Clonazepam, AcipHex, B12, Colace, multivitamin, fish oil,  Coumadin, Toprol-XL, folic acid, Lasix 40 mg daily.   Other medical problems:  See the extensive list on my note of December 04, 2005.   REVIEW OF SYSTEMS:  He is doing well today.  He has some mild shortness of  breath.  He mentions some peripheral edema.  Otherwise, his review of  systems is negative.   PHYSICAL EXAMINATION:  GENERAL:  He appears quite stable.  He is light-  skinned with a reddish tint to his skin from the sun.  VITAL SIGNS:  Blood pressure is 126/78 with a pulse of 52.  His weight is  203, his respirations are 18.  HEENT:  Reveals no xanthelasma.  He has normal extraocular motion.  There  are no carotid bruits.  There is no jugular venous distention.  LUNGS:  Clear.  Respiratory effort is not labored.  He has no kyphosis or  scoliosis.  CARDIAC:  Reveals an S1 with an S2.  There is a soft systolic murmur.  ABDOMEN:  Soft.  There are no masses or bruits.  There is no abdominal  aortic bruit.  His scars are nicely healed.  EXTREMITIES:  He has 2+ distal pulses.  He has 1+ peripheral edema.   EKG reveals sinus rhythm/sinus bradycardia at a rate of 52.   PROBLEMS:  Are listed  extensively on my note of December 04, 2005:  1. History of syncope on several occasions thought to be due to      orthostasis and other type of reactions to other medicines and he has      never needed a pacemaker.  2. Some orthostasis over time.  He is tolerating diuretics.  3. Shortness of breath.  We will not change any of his medicines at this      time.  He has ejection fraction in the 50-55% and he is stable.  4. Significant deep venous thrombosis and pulmonary emboli in the      beginning of 2007 and he is on Coumadin.   Cardiac status is stable.  No further cardiac testing at this point.            ______________________________  Luis Abed, MD, [facility]     JDK/MedQ  DD:  05/12/2006  DT:  05/13/2006  Job #:  [id number]   cc:   Julieanne Manson

## 2010-12-28 NOTE — Cardiovascular Report (Signed)
[name] [name], [name] [name]                  ACCOUNT NO.:  [id number]   MEDICAL RECORD NO.:  [id number]          PATIENT TYPE:  OIB   LOCATION:  NA                           FACILITY:  [facility]   PHYSICIAN:  Veverly Fells. Excell Seltzer, MD  DATE OF BIRTH:  1940-12-04   DATE OF PROCEDURE:  12/01/2006  DATE OF DISCHARGE:                            CARDIAC CATHETERIZATION   DATE OF PROCEDURE:  12/01/2006.   PROCEDURE:  Left heart catheterization, selective coronary angiography,  left ventricular angiography.   INDICATIONS:  [name] is a 70 year old gentleman who has been followed  by Dr. Myrtis Ser for several years.  He has had recurrent syncope and  underwent an adenosine Myoview study that showed a partially reversible  defect in the lateral wall of the left ventricle suspicious for  ischemia.  He was subsequently referred for cardiac catheterization.  He  has had multiple problems in the past including pulmonary emboli and  prior placement of an IVC filter.  He has been tolerating Coumadin for  several months prior to this procedure and his Coumadin has been held so  that he can safely undergo his catheterization.   Risks and indications of procedure were explained to the patient,  informed consent was obtained.  The right groin was prepped, draped and  anesthetized with 1% lidocaine.  Using modified Seldinger technique a 4-  French sheath was placed in the right femoral artery.  Multiple views of  the left and right coronary arteries were taken.  The left coronary  artery was imaged with a 4-French JL-4 catheter.  The right coronary  artery was very difficult to engage.  The origin of the right coronary  artery was high and anterior.  Ultimately the vessel was imaged with an  AL-1 catheter, but several catheters were tried prior to that.  Following selective coronary angiography an angled pigtail catheter was  inserted into the left ventricle and pressures were recorded.  A 30  degrees RAO left  ventriculogram was performed.  A pullback across the  aortic valve was done.   FINDINGS:  Aortic pressure 133/64 with a mean of 90, left ventricular  pressures 139/5 with an end-diastolic pressure of 16.   Coronary angiography.  The left main stem has an irregular plaque just  at its distal portion.  This represents an approximate 25% stenosis but  the plaque morphology is very irregular.  The left mainstem trifurcates  into the LAD, intermediate branch and left circumflex.   The LAD is a medium caliber vessel.  The ostium in the LAD has mild to  moderate range disease of approximately 40-50%.  There is a proximal  first diagonal branch that has a 75% ostial stenosis.  There is a small  second diagonal branch that arises from a portion of the proximal LAD  that itself has high-grade stenosis.  The proximal LAD at the origin of  that vessel has a least an 80% napkin ring appearing lesion.  The  remaining portions of the distal LAD have nonobstructive disease.  Just  after the third diagonal branch there is a  long area of 40-50% stenosis  in the mid to distal LAD.   The ramus intermedius branch is a medium-sized vessel.  It branches into  twin vessels.  The intermediate branch appears to have a 70% ostial  stenosis.  This is a smooth area of stenosis.   The left circumflex is large-caliber.  In its midportion it gives off a  first marginal branch that is occluded.  That branch fills via left-to-  left collateral.  In that portion of the true circumflex there is an 80%  stenosis that involves the ostium of the occluded marginal vessel.  The  left circumflex is dominant and it continues to course down the AV  groove and supplies a left PDA branch as well as multiple posterolateral  branches.  The lowest posterolateral branch has a 70% stenosis just  after the PDA origin.   The right coronary artery as described above has a high anterior  takeoff.  It is a small nondominant vessel.   There appears to be  nonobstructive disease throughout the proximal portions of that vessel  but no significant high-grade stenoses are present.   Left ventriculography performed in the 30 degrees right anterior oblique  projection demonstrates normal LV systolic function with an EF of 60%.   ASSESSMENT:  1. Severe two-vessel coronary artery disease with high-grade stenosis      of the LAD as well as multiple areas of severe stenoses involving      the left circumflex which include an ostial ramus stenosis as well      as an occluded obtuse marginal branch and a tight stenosis of the      mid circumflex  2. Nonobstructive disease in the nondominant right coronary artery.  3. Is normal left ventricular systolic function.   PLAN:  I will review revascularization options with the patient's  primary cardiologist Dr. Myrtis Ser.  I would favor coronary bypass surgery as  [name]  has multivessel disease that also involves multiple branches.  I think he would clearly have more complete revascularization with  surgical bypass.  He also has issues with Crohn's disease and already  requires Coumadin for history of pulmonary embolus and I think  committing him to dual antiplatelet therapy for any prolonged time would  be problematic.  He will continue on his medical therapy and I will  start him on a daily aspirin.      Veverly Fells. Excell Seltzer, MD  Electronically Signed     MDC/MEDQ  D:  12/01/2006  T:  12/01/2006  Job:  [id number]   cc:   Luis Abed, MD, [facility]  Richard Levy Sjogren

## 2010-12-28 NOTE — Assessment & Plan Note (Signed)
[facility]                            CARDIOLOGY OFFICE NOTE   NAME:[name] [name]                         MRN:          [id number]  DATE:11/26/2006                            DOB:          [birth year]    [name] [name] is followed carefully in our office. He had a spell prior  to November 13, 2006. He had eaten some fried fish and woke up in the middle  of the night with chest discomfort and indigestion. There was a question  of syncope at that time because he had bowel incontinence; however, he  was lying in bed. This whole episode lasted for approximately 3 hours.  He saw Dr. Mechele [name], his gastroenterologist. Dr. Mechele [name] decided not to  proceed with his colonoscopy and also thought that he should see  neurology and cardiology. The patient has seen neurology and it is my  understanding that MRI and EEG scans have been done and the patient was  notified that these studies were okay. The patient was seen here in our  office on November 13, 2006. He has been wearing a CardioNet monitor since  then. He had a 2-D echo showing good LV function. He had a Myoview scan  at [facility] on November 25, 2006. The study was read as showing decreased  activity in the lateral wall and was suspicious for ischemia. There was  also a question of decreased motion in that area on the nuclear scan.  This was read as partially reversible suspicious for ischemia and the  ejection fraction was normal.   Since that time, [name] [name] has been relatively stable. He has not called  in with any events concerning his heart rhythm. He will continue to wear  the CardioNet. He has not had any recurring spells. It is of note that  he has had syncopal type spells in the past when having significant  colitis. He is stable as of today.   After the patient was seen in our office on April 3, his lab study  showed that his potassium was 3.3. He has received potassium and a  repeat potassium has  normalized.   PAST MEDICAL HISTORY:   ALLERGIES:  METRONIDAZOLE, ASACOL, REMICADE, and PURINETHOL.   CURRENT MEDICATIONS:  Clonazepam, vitamin B injection monthly, Centrum  Silver, fish oil, calcium and vitamin D, Coumadin as directed, Toprol XL  25, Lasix 40, Humira for his Crohn's disease, folic acid, AcipHex,  lisinopril 10, Zegerid, Allegra, Benadryl, Zantac, potassium.   OTHER MEDICAL PROBLEMS:  See the list below.   REVIEW OF SYSTEMS:  He is feeling much better at this time. His review  of systems today is negative.   PHYSICAL EXAMINATION:  VITAL SIGNS:  Today blood pressure is 142/80,  pulse 65.  GENERAL:  The patient is here with his wife.  HEENT:  The patient has no xanthelasma. There is normal extraocular  motion.  NECK:  He has no carotid bruits. There is no jugular venous distention.  LUNGS:  Clear. Respiratory effort is not labored.  CARDIAC:  Reveals an S1 with an  S2. There are no clicks or significant  murmurs.  ABDOMEN:  Soft. He has normal bowel sounds. He has no significant  peripheral edema.   EKG on April 3 had shown sinus rhythm. A 2-D echo on April 15 shows  ejection fraction of 60%. There is mild aortic valve thickening. There  is mild mitral regurgitation. There is no cardiac source of embolus.  Myoview scan done at [facility] on November 24, 2006 showed question of a  partially reversible defect in the lateral wall as described above in  the HPI.   PROBLEM LIST:  1. History of syncope on 3 occasions prior to the one in early April      of this year. These were felt to be related to the episodes when he      was filled with Crohn's disease and possible orthostasis. There may      have been an allergic reaction to Remicade. It is also possible      that he may have had pulmonary emboli at that time. No pulmonary      emboli were seen at that time by CT scan in the past.  2. History of Crohn's disease. He has had significant ongoing problems      from  this.  3. History of some orthostasis over time.  4. Brief episodes of supraventricular tachycardia on one admission      over time. There have been no documented arrhythmias on his      outpatient Holter.  5. History of hypertension.  6. History of anemia with Crohn's disease.  7. History of steroid use over time in the past.  8. Status post cholecystectomy.  9. History of a colon tumor.  10.Shortness of breath. Volume was playing a role at one time. He has      been on a diuretic over time and stabilized.  11.Episode in April 2007 when the patient did have pulmonary emboli as      documented by CT scanning. At that point, there was concern that      the patient might have gastrointestinal bleeding related to his      Crohn's disease and therefore an IVC filter was placed. The patient      also was able to be treated with heparin and Coumadin and therefore      he has both an IVC filter and Coumadin treatment since April 2007.      It is also of note that at the time that the patient had the CT      scan showing pulmonary emboli in the vena cava filter that he did      have significant deep venous thrombosis at that time in April 2007.  12.Coumadin therapy as described above for the pulmonary emboli in      April 2007.  13.Question of sleep apnea.  14.Ejection fraction currently in the normal range of 60%.  15.Treatment with Humira for Crohn's disease.  16.Recent episode in early April 2008 of feeling chest discomfort and      indigestion and possible syncope in his bed at home. Evaluation for      this has included an echo showing good left ventricular function      and the fact that he is wearing a CardioNet monitor. However, there      is also an Adenosine Myoview scan suggesting lateral ischemia.   Considering all the issues above, I have decided now to proceed with outpatient cardiac catheterization. The  patient has significant family  history of coronary disease. Historically  we have followed him for all  of his events but he has never had a cardiac catheterization. I have  carefully considered all of this including the fact that he is on  Coumadin. I believe it can be safely held as he had a DVT in the past  and has been treated for over a year and he still has an IVC filter in  place. At the same time because of his Crohn's disease, there will have  to be very careful attention to how to proceed if the patient were to  receive a stent. He should receive a bare metal stent if anything. I  believe that outpatient catheterization is most appropriate for him at  this time and this will be arranged. The patient is not on aspirin due  to his history of many issues and Coumadin therapy.     Luis Abed, MD, [facility]  Electronically Signed    JDK/MedQ  DD: 11/26/2006  DT: 11/26/2006  Job #: [id number]   cc:   Rosendo Gros

## 2011-01-23 ENCOUNTER — Ambulatory Visit (INDEPENDENT_AMBULATORY_CARE_PROVIDER_SITE_OTHER): Payer: Medicare Other | Admitting: Emergency Medicine

## 2011-01-23 DIAGNOSIS — I2699 Other pulmonary embolism without acute cor pulmonale: Secondary | ICD-10-CM

## 2011-01-23 DIAGNOSIS — Z7901 Long term (current) use of anticoagulants: Secondary | ICD-10-CM

## 2011-02-04 ENCOUNTER — Other Ambulatory Visit: Payer: Self-pay | Admitting: Cardiology

## 2011-02-20 ENCOUNTER — Ambulatory Visit (INDEPENDENT_AMBULATORY_CARE_PROVIDER_SITE_OTHER): Payer: Medicare Other | Admitting: Emergency Medicine

## 2011-02-20 DIAGNOSIS — I2699 Other pulmonary embolism without acute cor pulmonale: Secondary | ICD-10-CM

## 2011-02-20 DIAGNOSIS — Z7901 Long term (current) use of anticoagulants: Secondary | ICD-10-CM

## 2011-03-20 ENCOUNTER — Ambulatory Visit (INDEPENDENT_AMBULATORY_CARE_PROVIDER_SITE_OTHER): Payer: Medicare Other | Admitting: Emergency Medicine

## 2011-03-20 DIAGNOSIS — Z7901 Long term (current) use of anticoagulants: Secondary | ICD-10-CM

## 2011-03-20 DIAGNOSIS — I2699 Other pulmonary embolism without acute cor pulmonale: Secondary | ICD-10-CM

## 2011-04-17 ENCOUNTER — Encounter: Payer: Self-pay | Admitting: Internal Medicine

## 2011-04-17 ENCOUNTER — Ambulatory Visit (INDEPENDENT_AMBULATORY_CARE_PROVIDER_SITE_OTHER): Payer: Medicare Other | Admitting: Emergency Medicine

## 2011-04-17 DIAGNOSIS — I2699 Other pulmonary embolism without acute cor pulmonale: Secondary | ICD-10-CM

## 2011-04-17 DIAGNOSIS — Z7901 Long term (current) use of anticoagulants: Secondary | ICD-10-CM

## 2011-04-17 LAB — POCT INR: INR: 3.1

## 2011-04-18 ENCOUNTER — Encounter: Payer: Self-pay | Admitting: Internal Medicine

## 2011-04-18 ENCOUNTER — Ambulatory Visit (INDEPENDENT_AMBULATORY_CARE_PROVIDER_SITE_OTHER): Payer: Medicare Other | Admitting: Internal Medicine

## 2011-04-18 DIAGNOSIS — G588 Other specified mononeuropathies: Secondary | ICD-10-CM

## 2011-04-18 DIAGNOSIS — R0600 Dyspnea, unspecified: Secondary | ICD-10-CM

## 2011-04-18 DIAGNOSIS — R0602 Shortness of breath: Secondary | ICD-10-CM

## 2011-04-18 DIAGNOSIS — G5680 Other specified mononeuropathies of unspecified upper limb: Secondary | ICD-10-CM

## 2011-04-18 DIAGNOSIS — R0609 Other forms of dyspnea: Secondary | ICD-10-CM

## 2011-04-18 NOTE — Patient Instructions (Addendum)
We will schedule you a follow up sniff test on return to this office to be done at [facility]   Weight control is simply a matter of calorie balance which needs to be tilted in your favor by eating less and exercising more.  To get the most out of exercise, you need to be continuously aware that you are short of breath, but never out of breath, for 30 minutes daily. As you improve, it will actually be easier for you to do the same amount of exercise  in  30 minutes so always push to the level where you are short of breath.  If this does not result in gradual weight reduction then I strongly recommend you see a nutritionist with a food diary x 2 weeks so that we can work out a negative calorie balance which is universally effective in steady weight loss programs.  Think of your calorie balance like you do your bank account where in this case you want the balance to go down so you must take in less calories than you burn up.  It's just that simple:  Hard to do, but easy to understand.  Good luck!

## 2011-04-18 NOTE — Assessment & Plan Note (Signed)
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   See instructions for specific recommendations which were reviewed directly with the patient who was given a copy with highlighter outlining the key components.

## 2011-04-18 NOTE — Assessment & Plan Note (Signed)
Not able to reproduce this but did note pace was very rapid and confirmed by wife that he outwalks her but then has to stop when he gets fatigued and out of breath.  Reviewed pacing in detail.

## 2011-04-18 NOTE — Progress Notes (Signed)
  Subjective:    Patient ID: [name] [name], male    DOB: 07/01/1941, 70 y.o.   MRN: [id number]  HPI    28 yowm never serious smoker with h/o L Leg DVT and PE in 2007  rx with IVC filter    04/18/2011 Initial pulmonary office eval cc indolent progressive fatigue x 6 months and sob whereas used to walk mall 30 minutes at a time now room to room and spells with exertion only where can't get breath assoc with light headedness  assoc with nighttime cough but  Not every night.  Mild assoc nasal congestion.  Pt denies any significant sore throat, dysphagia, itching, sneezing,  nasal congestion or excess/ purulent secretions,  fever, chills, sweats, unintended wt loss, pleuritic or exertional cp, hempoptysis, orthopnea pnd or leg swelling.    Also denies any obvious fluctuation of symptoms with weather or environmental changes or other aggravating or alleviating factors.       Review of Systems  Constitutional: Negative for fever, chills, activity change, appetite change and unexpected weight change.  HENT: Positive for sinus pressure. Negative for congestion, sore throat, rhinorrhea, sneezing, trouble swallowing, dental problem, voice change and postnasal drip.   Eyes: Negative for visual disturbance.  Respiratory: Positive for shortness of breath. Negative for cough and choking.   Cardiovascular: Negative for chest pain and leg swelling.  Gastrointestinal: Negative for nausea, vomiting and abdominal pain.  Genitourinary: Negative for difficulty urinating.  Musculoskeletal: Negative for arthralgias.  Skin: Negative for rash.  Psychiatric/Behavioral: Negative for behavioral problems and confusion.       Objective:   Physical Exam Obese amb wm nad Wt  214 04/18/2011  HEENT: nl dentition, turbinates, and orophanx. Nl external ear canals without cough reflex   NECK :  without JVD/Nodes/TM/ nl carotid upstrokes bilaterally   LUNGS: no acc muscle use,  Decrease bs R base  without cough on insp  or exp maneuvers   CV:  RRR  no s3 or murmur or increase in P2, no edema   ABD:  soft and nontender with limited  excursion in the supine position. No bruits or organomegaly, bowel sounds nl  MS:  warm without deformities, calf tenderness, cyanosis or clubbing  SKIN: warm and dry without lesions    NEURO:  alert, approp, no deficits   cxr 12/08/10  Comparison: None.  Findings: Elevation of the right hemidiaphragm is seen with atelectasis or scarring in the right lung base. Left lung is clear. There is no evidence of pleural effusion. Mild cardiomegaly is noted but there is no evidence of congestive heart failure. No mass or adenopathy identified.  IMPRESSION:  1. Elevation of right hemidiaphragm with associated right basilar atelectasis versus scarring.  2. Mild cardiomegaly.      Assessment & Plan:

## 2011-04-25 ENCOUNTER — Telehealth: Payer: Self-pay | Admitting: Internal Medicine

## 2011-04-25 DIAGNOSIS — R0602 Shortness of breath: Secondary | ICD-10-CM

## 2011-04-25 DIAGNOSIS — G588 Other specified mononeuropathies: Secondary | ICD-10-CM

## 2011-04-25 NOTE — Telephone Encounter (Signed)
Called and spoke with pt.  Pt states he was recently seen by MW on 9/6 and was supposed to be set up for a sniff test- however this was never ordered.  Will send order to [facility].

## 2011-05-01 ENCOUNTER — Encounter: Payer: Self-pay | Admitting: Internal Medicine

## 2011-05-01 ENCOUNTER — Ambulatory Visit (INDEPENDENT_AMBULATORY_CARE_PROVIDER_SITE_OTHER): Payer: Medicare Other | Admitting: Internal Medicine

## 2011-05-01 ENCOUNTER — Ambulatory Visit (HOSPITAL_COMMUNITY)
Admission: RE | Admit: 2011-05-01 | Discharge: 2011-05-01 | Disposition: A | Discharge status: Home / Self Care | Payer: Medicare Other | Source: Ambulatory Visit | Attending: Pulmonary Disease | Admitting: Pulmonary Disease

## 2011-05-01 VITALS — BP 132/72 | HR 63 | Temp 97.5°F | Ht 70.0 in | Wt 212.4 lb

## 2011-05-01 DIAGNOSIS — G5680 Other specified mononeuropathies of unspecified upper limb: Secondary | ICD-10-CM | POA: Insufficient documentation

## 2011-05-01 DIAGNOSIS — R0600 Dyspnea, unspecified: Secondary | ICD-10-CM

## 2011-05-01 DIAGNOSIS — R0602 Shortness of breath: Secondary | ICD-10-CM

## 2011-05-01 DIAGNOSIS — R0989 Other specified symptoms and signs involving the circulatory and respiratory systems: Secondary | ICD-10-CM

## 2011-05-01 DIAGNOSIS — G588 Other specified mononeuropathies: Secondary | ICD-10-CM

## 2011-05-01 DIAGNOSIS — Z9889 Other specified postprocedural states: Secondary | ICD-10-CM | POA: Insufficient documentation

## 2011-05-01 DIAGNOSIS — R0609 Other forms of dyspnea: Secondary | ICD-10-CM

## 2011-05-01 MED ORDER — OMEPRAZOLE 20 MG PO CPDR: DELAYED_RELEASE_CAPSULE | ORAL | Status: DC

## 2011-05-01 NOTE — Patient Instructions (Addendum)
Walking 30 min at a time at a pace where you are short of breath but never out of breath or feeling faint  I will call you with the fluoscopy when report's back  GERD (REFLUX)  is an extremely common cause of respiratory symptoms, many times with no significant heartburn at all.    It can be treated with medication, but also with lifestyle changes including avoidance of late meals, excessive alcohol, smoking cessation, and avoid fatty foods, chocolate, peppermint, colas, red wine, and acidic juices such as orange juice.  NO MINT OR MENTHOL PRODUCTS SO NO COUGH DROPS  USE SUGARLESS CANDY INSTEAD (jolley ranchers or Stover's)  NO OIL BASED VITAMINS - use powdered substitutes.   Try prilosec(omeprazole)  40mg   Take 30-60 min before first meal  and Zantac 150 mg one bedtime   Please schedule a follow up office visit in 4 weeks, sooner if needed with pft's on return

## 2011-05-01 NOTE — Assessment & Plan Note (Signed)
I had an extended discussion with the patient and wife  today lasting 15 to 20 minutes of a 25 minute visit on the following issues:   Weight control is simply a matter of calorie balance which needs to be tilted in your favor by eating less and exercising more.  To get the most out of exercise, you need to be continuously aware that you are short of breath, but never out of breath, for 30 minutes daily. As you improve, it will actually be easier for you to do the same amount of exercise  in  30 minutes so always push to the level where you are short of breath.  If this does not result in gradual weight reduction then I strongly recommend you see a nutritionist with a food diary x 2 weeks so that we can work out a negative calorie balance which is universally effective in steady weight loss programs.  Think of your calorie balance like you do your bank account where in this case you want the balance to go down so you must take in less calories than you burn up.  It's just that simple:  Hard to do, but easy to understand.  Good luck!

## 2011-05-01 NOTE — Progress Notes (Signed)
  Subjective:    Patient ID: [name] [name], male    DOB: 1941-05-07, 70 y.o.   MRN: [id number]  HPI    31 yowm never serious smoker with h/o L Leg DVT and PE in 2007  rx with IVC filter    04/18/2011 Initial pulmonary office eval cc indolent progressive fatigue x 6 months and sob whereas used to walk mall 30 minutes at a time now room to room and spells with exertion only where can't get breath assoc with light headedness  assoc with nighttime cough but  Not every night.  Mild assoc nasal congestion. rec Sniff test/ wt loss   05/01/2011 f/u ov/Seirra Kos cc very hoarse, worse hoarse with activity, no change doe, no sign cough.  Sleeping ok without nocturnal  or early am exacerbation  of respiratory  C/o's,  Also denies any obvious fluctuation of symptoms with weather or environmental changes or other aggravating or alleviating factors except as outlined above    Pt denies any significant sore throat, dysphagia, itching, sneezing,  nasal congestion or excess/ purulent secretions,  fever, chills, sweats, unintended wt loss, pleuritic or exertional cp, hempoptysis, orthopnea pnd or leg swelling.    Also denies any obvious fluctuation of symptoms with weather or environmental changes or other aggravating or alleviating factors.              Objective:   Physical Exam  Obese amb wm nad  Wt  214 04/18/2011  >   05/01/2011  212   HEENT: nl dentition, turbinates, and orophanx. Nl external ear canals without cough reflex   NECK :  without JVD/Nodes/TM/ nl carotid upstrokes bilaterally   LUNGS: no acc muscle use,  Decrease bs R base  without cough on insp or exp maneuvers   CV:  RRR  no s3 or murmur or increase in P2, no edema   ABD:  soft and nontender with limited  excursion in the supine position. No bruits or organomegaly, bowel sounds nl  MS:  warm without deformities, calf tenderness, cyanosis or clubbing  SKIN: warm and dry without lesions    NEURO:  alert, approp, no  deficits   cxr 12/08/10  Comparison: None.  Findings: Elevation of the right hemidiaphragm is seen with atelectasis or scarring in the right lung base. Left lung is clear. There is no evidence of pleural effusion. Mild cardiomegaly is noted but there is no evidence of congestive heart failure. No mass or adenopathy identified.  IMPRESSION:  1. Elevation of right hemidiaphragm with associated right basilar atelectasis versus scarring.  2. Mild cardiomegaly.      Assessment & Plan:

## 2011-05-01 NOTE — Assessment & Plan Note (Signed)
Not completely paralyzed but the atx on the L has been present since cabg and is very unlikely to improve with fob or any pulmonary intervention at this point.  rx with wt loss/ reconditioning

## 2011-05-12 ENCOUNTER — Other Ambulatory Visit: Payer: Self-pay | Admitting: Cardiology

## 2011-05-15 ENCOUNTER — Ambulatory Visit (INDEPENDENT_AMBULATORY_CARE_PROVIDER_SITE_OTHER): Payer: Medicare Other | Admitting: Emergency Medicine

## 2011-05-15 DIAGNOSIS — I2699 Other pulmonary embolism without acute cor pulmonale: Secondary | ICD-10-CM

## 2011-05-15 DIAGNOSIS — Z7901 Long term (current) use of anticoagulants: Secondary | ICD-10-CM

## 2011-05-29 ENCOUNTER — Ambulatory Visit: Payer: Medicare Other | Admitting: Internal Medicine

## 2011-06-03 ENCOUNTER — Telehealth: Payer: Self-pay | Admitting: *Deleted

## 2011-06-03 ENCOUNTER — Ambulatory Visit (INDEPENDENT_AMBULATORY_CARE_PROVIDER_SITE_OTHER): Payer: Medicare Other | Admitting: Cardiology

## 2011-06-03 DIAGNOSIS — R55 Syncope and collapse: Secondary | ICD-10-CM

## 2011-06-03 DIAGNOSIS — I471 Supraventricular tachycardia: Secondary | ICD-10-CM

## 2011-06-03 DIAGNOSIS — R0602 Shortness of breath: Secondary | ICD-10-CM

## 2011-06-03 DIAGNOSIS — I10 Essential (primary) hypertension: Secondary | ICD-10-CM

## 2011-06-03 DIAGNOSIS — I2510 Atherosclerotic heart disease of native coronary artery without angina pectoris: Secondary | ICD-10-CM

## 2011-06-03 DIAGNOSIS — I498 Other specified cardiac arrhythmias: Secondary | ICD-10-CM

## 2011-06-03 MED ORDER — WARFARIN SODIUM 5 MG PO TABS: 5.0000 mg | ORAL_TABLET | ORAL | Status: DC

## 2011-06-03 NOTE — Assessment & Plan Note (Signed)
There is been no recent syncope.  No change in therapy.  Will see him back in 6 months.

## 2011-06-03 NOTE — Patient Instructions (Signed)
Your physician recommends that you schedule a follow-up appointment in: 6 months, the office will mail you a reminder letter 2 months prior appointment date. Your physician recommends that you continue on your current medications as directed. Please refer to the Current Medication list given to you today. 

## 2011-06-03 NOTE — Progress Notes (Signed)
HPI Patient is seen today 2 followup coronary artery disease.  I saw him last April, 2012.  He's had some continued shortness of breath.  We have done a 2-D echo in March, 2012.  Ejection fraction was 60-65%.  There was good right ventricular function.  Since that time he has been assessed further.  This is included assessment by his primary team and also by our pulmonary team.  He does have an elevated hemidiaphragm.  He is brought labs to me from his primary care workup.  These will be scanned into the computer.  Hemoglobin is 16.  BUN is 11 creatinine 1.0 hemoglobin A1c is 6.1.  Iron studies are normal.  Folate is normal.  Testosterone was normal.  Chest x-ray revealed elevated right hemidiaphragm.  Chest CT revealed elevated right hemidiaphragm with collapse of the right middle lobe but no obstructing lesions.  No evidence of CHF.I personally reviewed these reports.  The patient is walking 30 minutes daily. Is not having any chest pain.  When he becomes short of breath he slows down.  He has not had any recurrent syncope or presyncope.  Allergies  Allergen Reactions  . Amiodarone Hcl   . Codeine   . Diltiazem Hcl   . Doxycycline   . Infliximab   . Mercaptopurine   . Mesalamine   . Methotrexate   . Metronidazole   . Pyridostigmine Bromide     Current Outpatient Prescriptions  Medication Sig Dispense Refill  . adalimumab (HUMIRA PEN) 40 MG/0.8ML injection Inject 40 mg into the skin. Every two weeks      . aspirin 81 MG tablet Take 81 mg by mouth daily.        . butalbital-aspirin-caffeine (FIORINAL) 50-325-40 MG per capsule Take 1 capsule by mouth every 4 (four) hours as needed.        . Calcium Carbonate-Vit D-Min 600-400 MG-UNIT TABS Take 1 tablet by mouth daily.        . cholecalciferol (VITAMIN D) 1000 UNITS tablet Take 1,000 Units by mouth daily.        Marland Kitchen dimenhyDRINATE (DRAMAMINE) 50 MG tablet Take 50 mg by mouth every 8 (eight) hours as needed.        . docusate sodium (COLACE)  100 MG capsule Take 100 mg by mouth as needed.        . fluticasone (FLONASE) 50 MCG/ACT nasal spray 2 sprays by Nasal route daily.        . folic acid (FOLVITE) 1 MG tablet Take 1 tablet by mouth daily.       Marland Kitchen loratadine (CLARITIN) 10 MG tablet 2 hrs before Humira       . losartan (COZAAR) 50 MG tablet TAKE 1 TABLET BY MOUTH ONCE A DAY  30 tablet  6  . metoprolol succinate (TOPROL-XL) 25 MG 24 hr tablet TAKE ONE TABLET BY MOUTH DAILY  30 tablet  11  . Multiple Vitamin (MULTIVITAMIN) tablet Take 1 tablet by mouth daily.        Marland Kitchen NITROSTAT 0.4 MG SL tablet Take 1 tablet by mouth as directed.      Marland Kitchen omeprazole (PRILOSEC) 20 MG capsule One daily Take 30-60 min before first meal of the day  34 capsule  11  . ranitidine (ZANTAC) 150 MG tablet 2 hrs before Humira       . simvastatin (ZOCOR) 40 MG tablet Take 1 tablet by mouth daily.      . vitamin B-12 (CYANOCOBALAMIN) 1000 MCG tablet Take 1,000  mcg by mouth every 30 (thirty) days.        Marland Kitchen warfarin (COUMADIN) 5 MG tablet TAKE AS DIRECTED BY COUMADIN CLINIC.  40 tablet  3    History   Social History  . Marital Status: Married    Spouse Name: N/A    Number of Children: N/A  . Years of Education: N/A   Occupational History  . retired    Social History Main Topics  . Smoking status: Former Smoker    Types: Cigarettes    Quit date: 08/12/1961  . Smokeless tobacco: Never Used   Comment: smoked occ in his teenage yrs  . Alcohol Use: No  . Drug Use: No  . Sexually Active: Not on file   Other Topics Concern  . Not on file   Social History Narrative  . No narrative on file    Family History  Problem Relation Age of Onset  . Coronary artery disease      Past Medical History  Diagnosis Date  . Syncope and collapse     Evaluation by Dr Turner Daniels; Syncope probably neutrally mediated and modest resting sinus bradycardia. and 16 B. episode of SVT that was either A fib or another type of SVT. Patient improved with combination of holding ACE  inhibitor or low BP and reducing beta blocker for bradycardia  . Hypertension                                                                                                                                                                                                                                                                                                                                                                                                                       .  Coronary artery disease     relook catheter 08/12/08 - all 5 grafts re pathend  . Crohn's disease     Humara Rx in past  . SVT (supraventricular tachycardia)   . 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 History of benign colon tumor   . Sleep apnea     questionable  . Mitral regurgitation     mild - echo 4/09  . Anxiety   . Gait difficulty     with Crestor (but tolerates simva)  . Pulmonary embolism     2007,b CT scan, IVC fiter placed then because of concern about coumadin use at that time.  . Orthostasis     treated  . Drug therapy     Intermittant steroids   . S/P IVC filter     2007  PE  . Warfarin anticoagulation   . Factor VIII     Factor VIII EXCESS.Marland KitchenMarland KitchenDr Hampton Abbot.Marland Kitchenanother reason for coumadin  . Hx of CABG     2008  . Incomplete RBBB   . Drug therapy     question rash diltiazem, and amio  . Gait abnormality     gait difficulty with crestor, OK with Simva  . Ejection fraction     EF 60-65%, echo, March, 2012, moderate diastolic dysfunction    Past Surgical History  Procedure Date  . Coronary artery bypass graft 4/08  . Cholecystectomy   . Back surgery     x2  . Cataract extraction     x2  . Colonoscopy w/ polypectomy     ROS  Patient denies fever, chills, headache, sweats, rash, change in vision, change in hearing, chest pain, cough, nausea vomiting, urinary symptoms.  All other systems are reviewed and are negative. PHYSICAL EXAM Patient is here with his wife today.  He is stable.   Head is atraumatic.  There is no xanthelasma.  There is no jugular venous distention.  Lungs reveal no rales.  There is no respiratory distress.  Cardiac exam reveals S1 and S2.  No clicks or significant murmurs.  The abdomen is soft.  No peripheral edema.  No musculoskeletal deformities.  No skin rashes Filed Vitals:   06/03/11 1034  BP: 150/89  Pulse: 56  Height: 5\' 10"  (1.778 m)  Weight: 211 lb (95.709 kg)   EKG is done today and reviewed by me.  There is mild sinus bradycardia.  There were mild nonspecific ST-T wave changes.  There is no significant change.  ASSESSMENT & PLAN

## 2011-06-03 NOTE — Assessment & Plan Note (Signed)
Blood pressure is reasonably well controlled.  We have push his pressure lower in the past he's had orthostatic symptoms.  No change in therapy.

## 2011-06-03 NOTE — Assessment & Plan Note (Signed)
Shortness of breath is being assessed fully by the pulmonary team.  No change in therapy.

## 2011-06-03 NOTE — Assessment & Plan Note (Signed)
Coronary disease is stable. No further workup is needed at this time. 

## 2011-06-03 NOTE — Telephone Encounter (Signed)
Pt would like warfarin refilled

## 2011-06-10 ENCOUNTER — Ambulatory Visit (INDEPENDENT_AMBULATORY_CARE_PROVIDER_SITE_OTHER): Payer: Medicare Other | Admitting: Internal Medicine

## 2011-06-10 ENCOUNTER — Encounter: Payer: Self-pay | Admitting: Internal Medicine

## 2011-06-10 VITALS — BP 122/70 | HR 68 | Temp 97.7°F | Ht 70.0 in | Wt 214.0 lb

## 2011-06-10 DIAGNOSIS — R0600 Dyspnea, unspecified: Secondary | ICD-10-CM

## 2011-06-10 DIAGNOSIS — R0609 Other forms of dyspnea: Secondary | ICD-10-CM

## 2011-06-10 DIAGNOSIS — R0602 Shortness of breath: Secondary | ICD-10-CM

## 2011-06-10 LAB — PULMONARY FUNCTION TEST

## 2011-06-10 NOTE — Assessment & Plan Note (Signed)
I had an extended discussion with the patient today lasting 15 to 20 minutes of a 25 minute visit on the following issues:   Doing much better overall, pft's show no airflow obstruction with only sign finding mod restriction with disproportionate decrease ERV  Low ERV typical of a diaphragm problem that can be corrected by wt loss.  Since his is already noting improvement should continue to work toward a wt of < 200 and contact us again asap if notices he's loosing any ground with ex tol while working on wt loss.

## 2011-06-10 NOTE — Progress Notes (Signed)
PFT done today. 

## 2011-06-10 NOTE — Progress Notes (Signed)
  Subjective:    Patient ID: [name] [name], male    DOB: 22-Dec-1940, 70 y.o.   MRN: [id number]  HPI    44 yowm with Chrohn's dz  never serious smoker with h/o L Leg DVT and PE in 2007  rx with IVC filter    04/18/2011 Initial pulmonary office eval cc indolent progressive fatigue x 6 months and sob whereas used to walk mall 30 minutes at a time now room to room and spells with exertion only where can't get breath assoc with light headedness  assoc with nighttime cough but  Not every night.  Mild assoc nasal congestion. rec Sniff test > no paradox rec reconditioning/ wt loss  05/01/2011 f/u ov/Wert cc very hoarse, worse hoarse with activity, no change doe, no sign cough. rec Walking 30 min at a time at a pace where you are short of breath but never out of breath or feeling faint I  will call you with the fluoscopy when report's back GERD diet  Try prilosec(omeprazole)  40mg   Take 30-60 min before first meal  and Zantac 150 mg one bedtime    06/10/2011 f/u ov/Wert cc able to walk 4 days a week x 30 min at mall and improving.   Sleeping ok without nocturnal  or early am exacerbation  of respiratory  C/o's,  Also denies any obvious fluctuation of symptoms with weather or environmental changes or other aggravating or alleviating factors except as outlined above   ROS  At present neg for  any significant sore throat, dysphagia, itching, sneezing,  nasal congestion or excess/ purulent secretions,  fever, chills, sweats, unintended wt loss, pleuritic or exertional cp, hempoptysis, orthopnea pnd or leg swelling.  Also denies presyncope, palpitations, heartburn, abdominal pain, nausea, vomiting, diarrhea  or change in bowel or urinary habits, dysuria,hematuria,  rash, arthralgias, visual complaints, headache, numbness weakness or ataxia.                 Objective:   Physical Exam  Obese amb wm nad  Wt  214 04/18/2011  >   05/01/2011  212 >  214 06/10/2011   HEENT: nl dentition, turbinates,  and orophanx. Nl external ear canals without cough reflex   NECK :  without JVD/Nodes/TM/ nl carotid upstrokes bilaterally   LUNGS: no acc muscle use,  Decrease bs R base  without cough on insp or exp maneuvers   CV:  RRR  no s3 or murmur or increase in P2, no edema   ABD:  soft and nontender with limited  excursion in the supine position. No bruits or organomegaly, bowel sounds nl  MS:  warm without deformities, calf tenderness, cyanosis or clubbing      cxr 12/08/10  Comparison: None.  Findings: Elevation of the right hemidiaphragm is seen with atelectasis or scarring in the right lung base. Left lung is clear. There is no evidence of pleural effusion. Mild cardiomegaly is noted but there is no evidence of congestive heart failure. No mass or adenopathy identified.  IMPRESSION:  1. Elevation of right hemidiaphragm with associated right basilar atelectasis versus scarring.  2. Mild cardiomegaly.      Assessment & Plan:

## 2011-06-10 NOTE — Patient Instructions (Addendum)
Weight control is simply a matter of calorie balance which needs to be tilted in your favor by eating less and exercising more.  To get the most out of exercise, you need to be continuously aware that you are short of breath, but never out of breath, for 30 minutes daily. As you improve, it will actually be easier for you to do the same amount of exercise  in  30 minutes so always push to the level where you are short of breath.  If this does not result in gradual weight reduction then I strongly recommend you see a nutritionist with a food diary x 2 weeks so that we can work out a negative calorie balance which is universally effective in steady weight loss programs.  Think of your calorie balance like you do your bank account where in this case you want the balance to go down so you must take in less calories than you burn up.  It's just that simple:  Hard to do, but easy to understand.  Good luck!    If you are satisfied with your treatment plan let your doctor know and he/she can either refill your medications or you can return here when your prescription runs out.     If in any way you are not 100% satisfied,  please tell us.  If 100% better, tell your friends!    

## 2011-06-12 ENCOUNTER — Ambulatory Visit (INDEPENDENT_AMBULATORY_CARE_PROVIDER_SITE_OTHER): Payer: Medicare Other | Admitting: Emergency Medicine

## 2011-06-12 DIAGNOSIS — Z7901 Long term (current) use of anticoagulants: Secondary | ICD-10-CM

## 2011-06-12 DIAGNOSIS — I2699 Other pulmonary embolism without acute cor pulmonale: Secondary | ICD-10-CM

## 2011-06-12 LAB — POCT INR: INR: 2.3

## 2011-07-17 ENCOUNTER — Ambulatory Visit (INDEPENDENT_AMBULATORY_CARE_PROVIDER_SITE_OTHER): Payer: Medicare Other | Admitting: Emergency Medicine

## 2011-07-17 DIAGNOSIS — I2699 Other pulmonary embolism without acute cor pulmonale: Secondary | ICD-10-CM

## 2011-07-17 DIAGNOSIS — Z7901 Long term (current) use of anticoagulants: Secondary | ICD-10-CM

## 2011-07-17 LAB — POCT INR: INR: 2.9

## 2011-08-28 ENCOUNTER — Ambulatory Visit (INDEPENDENT_AMBULATORY_CARE_PROVIDER_SITE_OTHER): Payer: Medicare Other | Admitting: Emergency Medicine

## 2011-08-28 DIAGNOSIS — Z7901 Long term (current) use of anticoagulants: Secondary | ICD-10-CM

## 2011-08-28 DIAGNOSIS — I2699 Other pulmonary embolism without acute cor pulmonale: Secondary | ICD-10-CM

## 2011-08-28 LAB — POCT INR: INR: 2.5

## 2011-09-16 ENCOUNTER — Telehealth: Payer: Self-pay | Admitting: Cardiology

## 2011-09-16 NOTE — Telephone Encounter (Signed)
09/16/11--pt's wife calling stating husband just told her he has been having CP radiating down left arm, on and off x 2weeks ---advised to come in for an appoint with s. Weaver on 09/18/11 at 3:55--pt's wife agrees--nt

## 2011-09-16 NOTE — Telephone Encounter (Signed)
New problem Pt's wife called and said he has been having some chest discomfort, somesob and  Left arm tingling that comes and goes for the past two weeks. No pain now. Please call

## 2011-09-18 ENCOUNTER — Encounter: Payer: Self-pay | Admitting: Nurse Practitioner

## 2011-09-18 ENCOUNTER — Ambulatory Visit (INDEPENDENT_AMBULATORY_CARE_PROVIDER_SITE_OTHER): Payer: Medicare Other | Admitting: Nurse Practitioner

## 2011-09-18 DIAGNOSIS — I2510 Atherosclerotic heart disease of native coronary artery without angina pectoris: Secondary | ICD-10-CM

## 2011-09-18 DIAGNOSIS — R0602 Shortness of breath: Secondary | ICD-10-CM

## 2011-09-18 DIAGNOSIS — Z7901 Long term (current) use of anticoagulants: Secondary | ICD-10-CM

## 2011-09-18 DIAGNOSIS — R079 Chest pain, unspecified: Secondary | ICD-10-CM

## 2011-09-18 MED ORDER — NITROGLYCERIN 0.4 MG SL SUBL: 0.4000 mg | SUBLINGUAL_TABLET | SUBLINGUAL | Status: DC

## 2011-09-18 NOTE — Progress Notes (Signed)
[name] [name] Date of Birth: 06-28-1941 Medical Record #[id number]  History of Present Illness: Mr. Stuber is seen today for a work in visit. He is seen for Dr. Myrtis Ser. He is a 71 year old male with known CAD and prior CABG x 5 back in 2008. He had repeat cath in 2010 showing his 5 grafts to be patent.   He is seen today for chest pain. He is here with his wife. He notes that he has had some midsternal chest pain off and on for the past several weeks. Can't really say when it started. Did not have at Christmas. No associated symptoms. Will last for just a few minutes. Last episode was on Sunday. Told his wife on Monday. No NTG taken.Says this feels different from his past chest pain syndrome. The discomfort he had with his CABG was more of an indigestion type feeling. He does not feel that way now. Also with some numbness in his left hand off and on. No other neurologic symptoms. Has had some neck pain and has had prior back surgeries x 2. Has cut back on his walking. Not really sure as to why. Wife feels he is depressed. He does have some chronic degree of shortness of breath due to his lung issues.   Current Outpatient Prescriptions on File Prior to Visit  Medication Sig Dispense Refill  . adalimumab (HUMIRA PEN) 40 MG/0.8ML injection Inject 40 mg into the skin. Every two weeks      . aspirin 81 MG tablet Take 81 mg by mouth daily.        . Calcium Carbonate-Vit D-Min 600-400 MG-UNIT TABS Take 1 tablet by mouth daily.        . cholecalciferol (VITAMIN D) 1000 UNITS tablet Take 1,000 Units by mouth daily.        Marland Kitchen dimenhyDRINATE (DRAMAMINE) 50 MG tablet Take 50 mg by mouth every 8 (eight) hours as needed.        . fluticasone (FLONASE) 50 MCG/ACT nasal spray 2 sprays by Nasal route daily.        . folic acid (FOLVITE) 1 MG tablet Take 1 tablet by mouth daily.       Marland Kitchen loratadine (CLARITIN) 10 MG tablet 2 hrs before Humira       . losartan (COZAAR) 50 MG tablet TAKE 1 TABLET BY MOUTH ONCE A DAY  30  tablet  6  . metoprolol succinate (TOPROL-XL) 25 MG 24 hr tablet TAKE ONE TABLET BY MOUTH DAILY  30 tablet  11  . Multiple Vitamin (MULTIVITAMIN) tablet Take 1 tablet by mouth daily.        Marland Kitchen omeprazole (PRILOSEC) 20 MG capsule One daily Take 30-60 min before first meal of the day  34 capsule  11  . ranitidine (ZANTAC) 150 MG tablet 2 hrs before Humira       . simvastatin (ZOCOR) 40 MG tablet Take 1 tablet by mouth daily.      Marland Kitchen warfarin (COUMADIN) 5 MG tablet Take 1 tablet (5 mg total) by mouth as directed.  40 tablet  3  . DISCONTD: NITROSTAT 0.4 MG SL tablet Take 1 tablet by mouth as directed.        Allergies  Allergen Reactions  . Amiodarone Hcl   . Codeine   . Diltiazem Hcl   . Doxycycline   . Infliximab   . Mercaptopurine   . Mesalamine   . Methotrexate   . Metronidazole   . Pyridostigmine Bromide  Past Medical History  Diagnosis Date  . Syncope and collapse     Evaluation by Dr Turner Daniels; Syncope probably neutrally mediated and modest resting sinus bradycardia. and 16 B. episode of SVT that was either A fib or another type of SVT. Patient improved with combination of holding ACE inhibitor or low BP and reducing beta blocker for bradycardia  . Hypertension                                                                                                                                                                                                                                                                                                                                                                                                                       . Coronary artery disease     relook cath 08/12/08 - all 5 grafts are patent  . Crohn's disease     Humara Rx in past  . SVT (supraventricular tachycardia)   . 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 History of benign colon tumor   . Sleep apnea     questionable  . Mitral regurgitation     mild - echo 4/09  . Anxiety   .  Gait difficulty     with Crestor (but tolerates simva)  . Pulmonary embolism     2007,b CT scan, IVC fiter placed then because of concern about coumadin use at that time.  . Orthostasis     treated  . Drug therapy  Intermittant steroids   . S/P IVC filter     2007  PE  . Warfarin anticoagulation   . Factor VIII     Factor VIII EXCESS.Marland KitchenMarland KitchenDr Hampton Abbot.Marland Kitchenanother reason for coumadin  . Hx of CABG     2008  . Incomplete RBBB   . Drug therapy     question rash diltiazem, and amio  . Ejection fraction     EF 60-65%, echo, March, 2012, moderate diastolic dysfunction  . 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 Sherene Sires; has chronic shortness of breath    Past Surgical History  Procedure Date  . Coronary artery bypass graft 4/08  . Cholecystectomy   . Back surgery     x2  . Cataract extraction     x2  . Colonoscopy w/ polypectomy     History  Smoking status  . Former Smoker  . Types: Cigarettes  . Quit date: 08/12/1961  Smokeless tobacco  . Never Used  Comment: smoked occ in his teenage yrs    History  Alcohol Use No    Family History  Problem Relation Age of Onset  . Coronary artery disease      Review of Systems: The review of systems is positive for chest pain. He has chronic shortness of breath. Is exercising less.  He did have a GI bug right after Christmas associated with vomiting and had another syncopal episode. Did not seek medical evaluation at that time. No recent syncope reported. Not lightheaded or dizzy. All other systems were reviewed and are negative.  Physical Exam: BP 146/78  Pulse 66  Ht 5\' 10"  (1.778 m)  Wt 208 lb (94.348 kg)  BMI 29.84 kg/m2  SpO2 93% Patient is pleasant and in no acute distress. His affect is flat and he seems depressed to me. Skin is warm and dry. Color is normal.  HEENT is unremarkable. Normocephalic/atraumatic. PERRL. Sclera are nonicteric. Neck is supple. No masses. No JVD. Lungs are clear. Cardiac exam shows a regular rate and  rhythm. Abdomen is soft. Extremities are without edema. Gait and ROM are intact. No gross neurologic deficits noted.  LABORATORY DATA: EKG today shows sinus rhythm. No acute changes. Tracing looks unchanged from his prior tracing.    Assessment / Plan:

## 2011-09-18 NOTE — Assessment & Plan Note (Signed)
His shortness of breath is chronic. He has been released from Dr. Sherene Sires. Unfortunately, he has stopped his exercise program which did help.

## 2011-09-18 NOTE — Assessment & Plan Note (Addendum)
He presents with atypical chest pain. This discomfort is not similar to his prior chest pain syndrome. May be more cervical spine related. Now about 4/5 years out from his CABG. Last cath in 2010 showed his grafts to be patent. Has NTG on hand but has not used. We have refilled his NTG today. He is reminded in how to take NTG. Will arrange for Lexiscan. I will have him follow up with Dr. Myrtis Ser to discuss. Patient is agreeable to this plan and will call if any problems develop in the interim.

## 2011-09-18 NOTE — Assessment & Plan Note (Signed)
No adverse effects with his coumadin noted.

## 2011-09-18 NOTE — Patient Instructions (Signed)
We are going to arrange for a stress test.  Use your NTG under your tongue for recurrent chest pain. May take one tablet every 5 minutes. If you are still having discomfort after 3 tablets in 15 minutes, call 911.  I will have you see Dr. Myrtis Ser in about 2 weeks.   Call the [facility] office at [phone] if you have any questions, problems or concerns.

## 2011-09-24 ENCOUNTER — Ambulatory Visit (HOSPITAL_COMMUNITY): Payer: Medicare Other | Attending: Nurse Practitioner | Admitting: Radiology

## 2011-09-24 DIAGNOSIS — Z951 Presence of aortocoronary bypass graft: Secondary | ICD-10-CM | POA: Insufficient documentation

## 2011-09-24 DIAGNOSIS — R209 Unspecified disturbances of skin sensation: Secondary | ICD-10-CM | POA: Insufficient documentation

## 2011-09-24 DIAGNOSIS — Z8249 Family history of ischemic heart disease and other diseases of the circulatory system: Secondary | ICD-10-CM | POA: Insufficient documentation

## 2011-09-24 DIAGNOSIS — R5383 Other fatigue: Secondary | ICD-10-CM | POA: Insufficient documentation

## 2011-09-24 DIAGNOSIS — R079 Chest pain, unspecified: Secondary | ICD-10-CM | POA: Insufficient documentation

## 2011-09-24 DIAGNOSIS — E785 Hyperlipidemia, unspecified: Secondary | ICD-10-CM | POA: Insufficient documentation

## 2011-09-24 DIAGNOSIS — R5381 Other malaise: Secondary | ICD-10-CM | POA: Insufficient documentation

## 2011-09-24 DIAGNOSIS — I10 Essential (primary) hypertension: Secondary | ICD-10-CM | POA: Insufficient documentation

## 2011-09-24 DIAGNOSIS — Z87891 Personal history of nicotine dependence: Secondary | ICD-10-CM | POA: Insufficient documentation

## 2011-09-24 DIAGNOSIS — I4949 Other premature depolarization: Secondary | ICD-10-CM

## 2011-09-24 DIAGNOSIS — I2510 Atherosclerotic heart disease of native coronary artery without angina pectoris: Secondary | ICD-10-CM

## 2011-09-24 DIAGNOSIS — R0602 Shortness of breath: Secondary | ICD-10-CM | POA: Insufficient documentation

## 2011-09-24 MED ORDER — TECHNETIUM TC 99M TETROFOSMIN IV KIT
33.0000 | PACK | Freq: Once | INTRAVENOUS | Status: AC | PRN
  Administered 2011-09-24: 33 via INTRAVENOUS

## 2011-09-24 MED ORDER — TECHNETIUM TC 99M TETROFOSMIN IV KIT
11.0000 | PACK | Freq: Once | INTRAVENOUS | Status: AC | PRN
  Administered 2011-09-24: 11 via INTRAVENOUS

## 2011-09-24 MED ORDER — REGADENOSON 0.4 MG/5ML IV SOLN
0.4000 mg | Freq: Once | INTRAVENOUS | Status: AC
  Administered 2011-09-24: 0.4 mg via INTRAVENOUS

## 2011-09-24 NOTE — Progress Notes (Signed)
[facility] SITE 3 NUCLEAR MED [address] [place] [address] [phone]  Cardiology Nuclear Med Study  [name] [name] is a 71 y.o. male [id number] 11-30-1940   Nuclear Med Background Indication for Stress Test:  Evaluation for Ischemia and Graft Patency History:  3/12 Echo:EF=60-65% mild MR and moderate Diastolic dysfunction;'10 Heart Catheterization:Grafts patent;'08 OZH:YQMV hypokinesis of LV(lateral wall)>CABG x 5 Cardiac Risk Factors: Family History - CAD, History of Smoking, Hypertension and Lipids  Symptoms:  Chest Pain(last date of chest pain 09/15/11),numbness to Left hand, Fatigue and SOB   Nuclear Pre-Procedure Caffeine/Decaff Intake:  None NPO After: 6:30am   Lungs:  clear IV 0.9% NS with Angio Cath:  22g  IV Site: L Antecubital x 1, tolerated well IV Started by:  Irean Hong, RN  Chest Size (in):  44 Cup Size: n/a  Height: 5\' 10"  (1.778 m)  Weight:  208 lb (94.348 kg)  BMI:  Body mass index is 29.84 kg/(m^2). Tech Comments:  Last metoprolol last night    Nuclear Med Study 1 or 2 day study: 1 day  Stress Test Type:  Treadmill/Lexiscan  Reading MD: Willa Rough, MD  Order Authorizing Provider:  Willa Rough, MD, and Norma Fredrickson, NP  Resting Radionuclide: Technetium 30m Tetrofosmin  Resting Radionuclide Dose: 11.0 mCi   Stress Radionuclide:  Technetium 30m Tetrofosmin  Stress Radionuclide Dose: 33.0 mCi           Stress Protocol Rest HR: 61 Stress HR: 105  Rest BP: 137/88 Stress BP: 162/81  Exercise Time (min): 2:00 METS: 1.7   Predicted Max HR: 149 bpm % Max HR: 70.47 bpm Rate Pressure Product: 78469   Dose of Adenosine (mg):  n/a Dose of Lexiscan: 0.4 mg  Dose of Atropine (mg): n/a Dose of Dobutamine: n/a mcg/kg/min (at max HR)  Stress Test Technologist: Cathlyn Parsons, RN  Nuclear Technologist:  Domenic Polite, CNMT     Rest Procedure:  Myocardial perfusion imaging was performed at rest 45 minutes following the  intravenous administration of Technetium 51m Tetrofosmin. Rest ECG: NSR-IRBBB  Stress Procedure:  The patient received IV Lexiscan 0.4 mg over 15-seconds with concurrent low level exercise and then Technetium 23m Tetrofosmin was injected at 30-seconds while the patient continued walking one more minute.  There were no significant changes with Lexiscan;occasional PVC's.  Quantitative spect images were obtained after a 45-minute delay. Stress ECG: No significant change from baseline ECG  QPS Raw Data Images:  Normal; no motion artifact; normal heart/lung ratio. Stress Images:  Normal homogeneous uptake in all areas of the myocardium. Rest Images:  Normal homogeneous uptake in all areas of the myocardium. Subtraction (SDS):  No evidence of ischemia. Transient Ischemic Dilatation (Normal <1.22):  1.11 Lung/Heart Ratio (Normal <0.45):  0.42  Quantitative Gated Spect Images QGS EDV:  100 ml QGS ESV:  37 ml QGS cine images:  Normal Wall Motion QGS EF: 63%  Impression Exercise Capacity:  Lexiscan with low level exercise. BP Response:  Normal blood pressure response. Clinical Symptoms:  Shortness of breath ECG Impression:  No significant ST segment change suggestive of ischemia. Comparison with Prior Nuclear Study: No images to compare  Overall Impression:  Normal stress nuclear study.  [name], MD

## 2011-10-09 ENCOUNTER — Ambulatory Visit (INDEPENDENT_AMBULATORY_CARE_PROVIDER_SITE_OTHER): Payer: Medicare Other | Admitting: *Deleted

## 2011-10-09 DIAGNOSIS — Z7901 Long term (current) use of anticoagulants: Secondary | ICD-10-CM

## 2011-10-09 DIAGNOSIS — I2699 Other pulmonary embolism without acute cor pulmonale: Secondary | ICD-10-CM

## 2011-10-22 ENCOUNTER — Ambulatory Visit: Payer: Medicare Other | Admitting: Cardiology

## 2011-10-25 ENCOUNTER — Ambulatory Visit: Payer: Medicare Other | Admitting: Cardiology

## 2011-11-15 ENCOUNTER — Encounter: Payer: Self-pay | Admitting: Physician Assistant

## 2011-11-15 ENCOUNTER — Ambulatory Visit (INDEPENDENT_AMBULATORY_CARE_PROVIDER_SITE_OTHER): Payer: Medicare Other | Admitting: Physician Assistant

## 2011-11-15 VITALS — BP 140/88 | HR 70 | Ht 70.0 in | Wt 212.0 lb

## 2011-11-15 DIAGNOSIS — R079 Chest pain, unspecified: Secondary | ICD-10-CM

## 2011-11-15 DIAGNOSIS — I2510 Atherosclerotic heart disease of native coronary artery without angina pectoris: Secondary | ICD-10-CM

## 2011-11-15 DIAGNOSIS — R002 Palpitations: Secondary | ICD-10-CM

## 2011-11-15 MED ORDER — OMEPRAZOLE 40 MG PO CPDR: 40.0000 mg | DELAYED_RELEASE_CAPSULE | Freq: Every day | ORAL | Status: DC

## 2011-11-15 NOTE — Progress Notes (Signed)
[address] [place], [place]  [address] Phone: [phone] Fax:  [phone]  Date:  11/15/2011   Name:  [name] [name]       DOB:  [birth year] MRN:  [id number]  PCP:  Dr. Sullivan Lone Primary Cardiologist:  Dr. Zackery Barefoot  Primary Electrophysiologist:  None    History of Present Illness: [name] [name] is a 71 y.o. male who presents for evaluation of chest pain.  He has a history of CAD, status post CABG in 2008, syncope (probably neurally mediated-evaluated by Dr. Graciela Husbands in the past), Hypertension, Crohn's disease, prior pulmonary embolism, chronic Coumadin therapy, status post IVC filter, hypercoagulable state, chronic dyspnea-evaluated by Dr. Sherene Sires in the past.  Last LHC 02/2009: LIMA-LAD patent, SVG-D1 patent, SVG-OM1/OM2 patent, SVG-PL patent, EF 55-60%.  Last echo 10/2010: Mild LVH, EF 60-65%, grade 2 diastolic dysfunction, mild MR, mild LAE, PASP 19.  He was seen in the office 09/18/11 with chest discomfort.  Lexiscan Myoview 09/24/11: EF 63%, no ischemia.  He continues to have occasional chest tightness.  He is a vague historian.  Symptoms generally occur at night.  They are brief and lasted just a few minutes.  He did not have any associated shortness of breath.  He did have some left arm symptoms associated with his chest discomfort.  However, since his stress test, this has not recurred.  He can not reproduce his symptoms with exertion.  He continues to have exertional dyspnea.  This is unchanged.  He denies orthopnea, PND or edema.  He denies syncope.  He has recorded blood pressure fluctuations with his symptoms.  His pressure has been as high as 162/88.  He felt lightheaded one time And his heart rate was in the 50s.  He had another episode and his heart rate was in the 115 range.  He does note occasional rapid palpitations.  Past Medical History  Diagnosis Date  . Syncope and collapse     Evaluation by Dr Turner Daniels; Syncope probably neutrally mediated and modest resting  sinus bradycardia. and 16 B. episode of SVT that was either A fib or another type of SVT. Patient improved with combination of holding ACE inhibitor or low BP and reducing beta blocker for bradycardia  . Hypertension                                                                                                                                                                                                                                                                                                                                                                                                                       .  Coronary artery disease     relook cath 08/12/08 - all 5 grafts are patent  . Crohn's disease     Humara Rx in past  . SVT (supraventricular tachycardia)   . 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 History of benign colon tumor   . Sleep apnea     questionable  . Mitral regurgitation     mild - echo 4/09  . Anxiety   . Gait difficulty     with Crestor (but tolerates simva)  . Pulmonary embolism     2007,b CT scan, IVC fiter placed then because of concern about coumadin use at that time.  . Orthostasis     treated  . Drug therapy     Intermittant steroids   . S/P IVC filter     2007  PE  . Warfarin anticoagulation   . Factor VIII     Factor VIII EXCESS.Marland KitchenMarland KitchenDr Hampton Abbot.Marland Kitchenanother reason for coumadin  . Hx of CABG     2008  . Incomplete RBBB   . Drug therapy     question rash diltiazem, and amio  . Ejection fraction     EF 60-65%, echo, March, 2012, moderate diastolic dysfunction  . 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 Sherene Sires; has chronic shortness of breath    Current Outpatient Prescriptions  Medication Sig Dispense Refill  . adalimumab (HUMIRA PEN) 40 MG/0.8ML injection Inject 40 mg into the skin.       Marland Kitchen aspirin 81 MG tablet Take 81 mg by mouth daily.        . Calcium Carbonate-Vit D-Min 600-400 MG-UNIT TABS Take 1 tablet by mouth daily.        . cholecalciferol  (VITAMIN D) 1000 UNITS tablet Take 1,000 Units by mouth daily.        Marland Kitchen dimenhyDRINATE (DRAMAMINE) 50 MG tablet Take 50 mg by mouth every 8 (eight) hours as needed.        . fluticasone (FLONASE) 50 MCG/ACT nasal spray 2 sprays by Nasal route daily.        . folic acid (FOLVITE) 1 MG tablet Take 1 tablet by mouth daily.       Marland Kitchen loratadine (CLARITIN) 10 MG tablet 2 hrs before Humira       . losartan (COZAAR) 50 MG tablet TAKE 1 TABLET BY MOUTH ONCE A DAY  30 tablet  6  . metoprolol succinate (TOPROL-XL) 25 MG 24 hr tablet TAKE ONE TABLET BY MOUTH DAILY  30 tablet  11  . Multiple Vitamin (MULTIVITAMIN) tablet Take 1 tablet by mouth daily.        . nitroGLYCERIN (NITROSTAT) 0.4 MG SL tablet Take 1 tablet (0.4 mg total) by mouth as directed.  25 tablet  6  . omeprazole (PRILOSEC) 20 MG capsule One daily Take 30-60 min before first meal of the day  34 capsule  11  . predniSONE (DELTASONE) 20 MG tablet Take 20 mg by mouth as needed. 30mg , 30mg ,  two days prior to humira day of humira takes 20mg       . ranitidine (ZANTAC) 150 MG tablet 2 hrs before Humira       . simvastatin (ZOCOR) 40 MG tablet Take 1 tablet by mouth daily.      Marland Kitchen warfarin (COUMADIN) 5 MG tablet Take 1 tablet (5 mg total) by mouth as directed.  40 tablet  3    Allergies: Allergies  Allergen Reactions  . Amiodarone Hcl   .  Codeine   . Diltiazem Hcl   . Doxycycline   . Infliximab   . Mercaptopurine   . Mesalamine   . Methotrexate   . Metronidazole   . Pyridostigmine Bromide     History  Substance Use Topics  . Smoking status: Former Smoker    Types: Cigarettes    Quit date: 08/12/1961  . Smokeless tobacco: Never Used   Comment: smoked occ in his teenage yrs  . Alcohol Use: No     ROS:  Please see the history of present illness.   He does get flushed with his Humira.  He does note increased heart rate with taking prednisone.  He does not seem to describe chest pain associated with meals.  He denies dysphagia.  He  denies significant belching.  He denies melena, hematochezia, hematuria, vomiting, diarrhea.  All other systems reviewed and negative.   PHYSICAL EXAM: VS:  BP 140/88  Pulse 70  Ht 5\' 10"  (1.778 m)  Wt 212 lb (96.163 kg)  BMI 30.42 kg/m2  SpO2 97% Well nourished, well developed, in no acute distress HEENT: normal Neck: no JVD Vascular: No carotid bruits Cardiac:  normal S1, S2; RRR; no murmur Lungs:  clear to auscultation bilaterally, no wheezing, rhonchi or rales Abd: soft, nontender, no hepatomegaly Ext: no edema Skin: warm and dry Neuro:  CNs 2-12 intact, no focal abnormalities noted   EKG:  Sinus rhythm, heart rate 70, incomplete right bundle branch block, nonspecific ST-T wave changes  ASSESSMENT AND PLAN:  1.  Chest Pain Atypical.  He had a normal Myoview in February.  His cardiac catheterization in 2010 demonstrated patent bypass grafts.  In further discussion with the patient, he is concerned that the stress test is missing something.  He apparently had a normal stress test prior to his bypass.  I discussed his case with Dr. Myrtis Ser, his primary cardiologist.  At this point, we would like to avoid proceeding with cardiac catheterization if at all possible.  He has had some palpitations.  I will arrange an event monitor.  He also has a history of acid reflux disease and takes chronic prednisone.  I will increase his Prilosec to 40 mg a day.  He will followup with Dr. Myrtis Ser in the next 3-4 weeks.  2.  Palpitations Arrange event monitor.  Echocardiogram last her demonstrated normal LV function.  Followup with Dr. Myrtis Ser as noted.  3.  CAD Continue aspirin and statin.  4.  HTN Continue current therapy.  5.  Dyspnea This is chronic.  He has been fully evaluated in the past.  6.  GERD Adjust Prilosec as noted.  7.  Hypercoagulable State and Prior Pulmonary Embolism, s/p IVC Filter Should we decide to proceed with cardiac catheterization, he will need bridging with  Lovenox.   Signed, Tereso Newcomer, PA-C  9:16 AM 11/15/2011

## 2011-11-15 NOTE — Patient Instructions (Signed)
Your physician recommends that you keep your follow-up appointment with Dr Myrtis Ser Your physician has recommended that you wear an event monitor. Event monitors are medical devices that record the heart's electrical activity. Doctors most often [place] these monitors to diagnose arrhythmias. Arrhythmias are problems with the speed or rhythm of the heartbeat. The monitor is a small, portable device. You can wear one while you do your normal daily activities. This is usually used to diagnose what is causing palpitations/syncope (passing out).  Your physician has recommended you make the following change in your medication: INCREASE Omeprazole to 40 mg daily

## 2011-11-20 ENCOUNTER — Encounter (INDEPENDENT_AMBULATORY_CARE_PROVIDER_SITE_OTHER): Payer: Medicare Other

## 2011-11-20 ENCOUNTER — Ambulatory Visit (INDEPENDENT_AMBULATORY_CARE_PROVIDER_SITE_OTHER): Payer: Medicare Other

## 2011-11-20 DIAGNOSIS — Z7901 Long term (current) use of anticoagulants: Secondary | ICD-10-CM

## 2011-11-20 DIAGNOSIS — R002 Palpitations: Secondary | ICD-10-CM

## 2011-11-20 DIAGNOSIS — I2699 Other pulmonary embolism without acute cor pulmonale: Secondary | ICD-10-CM

## 2011-11-25 ENCOUNTER — Telehealth: Payer: Self-pay | Admitting: Physician Assistant

## 2011-11-25 NOTE — Telephone Encounter (Signed)
Called by [facility] re: patient's HR in 130-150 noted to be atrial fibrillation. Called the patient's home and cell phone with no answer. Was able to speak with wife, he was not home, but had not complained of any new changes. Patient took meds today. Advised that if he was experiencing symptoms- dizziness, lightheadedness, chest pain or increased shortness of breath to present to the ED. Otherwise, will notify office and to contact office tomorrow. Patient took all meds today. Advised to take BP on home BP cuff, monitor HR and to take an additional Toprol-XL if tachycardic. Patient with history of SVT, on chronic Coumadin for hypercoagulability and history of PE.  She understood and agreed.    Jacqulyn Bath, PA-C 11/25/2011  8:13 PM

## 2011-11-26 ENCOUNTER — Other Ambulatory Visit: Payer: Self-pay

## 2011-11-26 LAB — CBC
HGB: 16.1 g/dL (ref 13.0–18.0)
MCH: 32.2 pg (ref 26.0–34.0)
MCV: 97 fL (ref 80–100)
Platelet: 248 10*3/uL (ref 150–440)
RBC: 4.99 10*6/uL (ref 4.40–5.90)
WBC: 10.4 10*3/uL (ref 3.8–10.6)

## 2011-11-26 LAB — BASIC METABOLIC PANEL
BUN: 21 mg/dL — ABNORMAL HIGH (ref 7–18)
Calcium, Total: 8.9 mg/dL (ref 8.5–10.1)
Chloride: 105 mmol/L (ref 98–107)
Co2: 27 mmol/L (ref 21–32)
Creatinine: 1.18 mg/dL (ref 0.60–1.30)
EGFR (African American): >60
EGFR (Non-African Amer.): >60
Glucose: 117 mg/dL — ABNORMAL HIGH (ref 65–99)
Osmolality: 285 (ref 275–301)

## 2011-11-26 LAB — PROTIME-INR: INR: 1.9

## 2011-11-26 MED ORDER — METOPROLOL SUCCINATE ER 50 MG PO TB24: 50.0000 mg | ORAL_TABLET | Freq: Every day | ORAL | Status: DC

## 2011-11-26 NOTE — Telephone Encounter (Signed)
Scott. Didn't know if you got this note,. Thanks for helping with this.   Trey Paula

## 2011-11-26 NOTE — Telephone Encounter (Signed)
Per Hubert Azure, RN she already s/w pt and about monitor strips and to increase toprol xl to 50 mg daily. Dennie is working on getting another strip from [employer] today per the request of Tereso Newcomer, PAC. Danielle Rankin

## 2011-11-26 NOTE — Telephone Encounter (Signed)
Pt was notified.  

## 2011-11-26 NOTE — Telephone Encounter (Signed)
Reviewed strips. He was in AFib with RVR last night. Increase Toprol to 50 mg daily. Please have [facility] do a follow up strip today to see if he is in NSR or still in AFib. He is on coumadin already. Tereso Newcomer, PA-C  1:33 PM 11/26/2011

## 2011-11-26 NOTE — Telephone Encounter (Signed)
Please get strips. Please contact patient to see if he is doing ok. Please get follow up strip this morning. Tereso Newcomer, PA-C  8:27 AM 11/26/2011

## 2011-11-26 NOTE — Telephone Encounter (Signed)
Pt was called this am and states he is feeling ok.  He deniesdizziness, lightheadedness, weakness or increased shortness of breath.  He states he had some chest pain x 2 last evening that was relieved by burping one time and went away on it's own the other time.  Both episodes of chest pain lasted about a minute.  No chest pain during the night or this am.

## 2011-11-27 ENCOUNTER — Inpatient Hospital Stay: Payer: Self-pay | Admitting: Internal Medicine

## 2011-11-27 ENCOUNTER — Telehealth: Payer: Self-pay | Admitting: *Deleted

## 2011-11-27 DIAGNOSIS — I517 Cardiomegaly: Secondary | ICD-10-CM

## 2011-11-27 DIAGNOSIS — I4891 Unspecified atrial fibrillation: Secondary | ICD-10-CM

## 2011-11-27 LAB — URINALYSIS, COMPLETE
Bacteria: NONE SEEN
Bilirubin,UR: NEGATIVE
Glucose,UR: NEGATIVE mg/dL (ref 0–75)
Nitrite: NEGATIVE
Ph: 6 (ref 4.5–8.0)
RBC,UR: 1 /HPF (ref 0–5)
Specific Gravity: 1.017 (ref 1.003–1.030)
Squamous Epithelial: NONE SEEN
WBC UR: NONE SEEN /HPF (ref 0–5)

## 2011-11-27 LAB — CK TOTAL AND CKMB (NOT AT ARMC)
CK, Total: 96 U/L (ref 35–232)
CK, Total: 78 U/L (ref 35–232)
CK, Total: 69 U/L (ref 35–232)
CK-MB: 1.9 ng/mL (ref 0.5–3.6)
CK-MB: 1.7 ng/mL (ref 0.5–3.6)
CK-MB: 1.7 ng/mL (ref 0.5–3.6)

## 2011-11-27 NOTE — Telephone Encounter (Signed)
lmom monitor strip from yesterday looks better is NSR HR in the 70's, pt will f/u with Dr. Katz 4/26. I accidentally stated Dr. Crenshaw for f/u in error and pt is to stay on increased dose of toprol as instructed yesterday. Reco Shonk  

## 2011-11-27 NOTE — Telephone Encounter (Signed)
lmom monitor strip from yesterday looks better is NSR HR in the 70's, pt will f/u with Dr. Myrtis Ser 4/26. I accidentally stated Dr. Jens Som for f/u in error and pt is to stay on increased dose of toprol as instructed yesterday. Danielle Rankin

## 2011-11-27 NOTE — Telephone Encounter (Signed)
Follow up strips demonstrate NSR with HR 70s Proceed as planned with increase in Toprol and continue wearing monitor. Follow up as planned. Tereso Newcomer, PA-C  8:10 AM 11/27/2011

## 2011-11-28 LAB — LIPID PANEL
Cholesterol: 154 mg/dL (ref 0–200)
HDL Cholesterol: 39 mg/dL — ABNORMAL LOW (ref 40–60)
Ldl Cholesterol, Calc: 93 mg/dL (ref 0–100)
Triglycerides: 111 mg/dL (ref 0–200)
VLDL Cholesterol, Calc: 22 mg/dL (ref 5–40)

## 2011-11-28 LAB — BASIC METABOLIC PANEL
Anion Gap: 8 (ref 7–16)
Calcium, Total: 8.8 mg/dL (ref 8.5–10.1)
Chloride: 106 mmol/L (ref 98–107)
Creatinine: 1.03 mg/dL (ref 0.60–1.30)
EGFR (African American): >60
EGFR (Non-African Amer.): >60
Glucose: 91 mg/dL (ref 65–99)
Osmolality: 280 (ref 275–301)
Sodium: 140 mmol/L (ref 136–145)

## 2011-11-28 LAB — MAGNESIUM: Magnesium: 1.9 mg/dL

## 2011-11-28 LAB — PROTIME-INR: INR: 2.1

## 2011-12-04 ENCOUNTER — Encounter: Payer: Self-pay | Admitting: Cardiology

## 2011-12-06 ENCOUNTER — Ambulatory Visit (INDEPENDENT_AMBULATORY_CARE_PROVIDER_SITE_OTHER): Payer: Medicare Other | Admitting: Cardiology

## 2011-12-06 ENCOUNTER — Ambulatory Visit (INDEPENDENT_AMBULATORY_CARE_PROVIDER_SITE_OTHER): Payer: Medicare Other | Admitting: *Deleted

## 2011-12-06 ENCOUNTER — Encounter: Payer: Self-pay | Admitting: Cardiology

## 2011-12-06 VITALS — BP 136/78 | HR 56 | Ht 70.0 in | Wt 215.0 lb

## 2011-12-06 DIAGNOSIS — Z09 Encounter for follow-up examination after completed treatment for conditions other than malignant neoplasm: Secondary | ICD-10-CM

## 2011-12-06 DIAGNOSIS — Z9229 Personal history of other drug therapy: Secondary | ICD-10-CM | POA: Insufficient documentation

## 2011-12-06 DIAGNOSIS — I2510 Atherosclerotic heart disease of native coronary artery without angina pectoris: Secondary | ICD-10-CM

## 2011-12-06 DIAGNOSIS — Z7901 Long term (current) use of anticoagulants: Secondary | ICD-10-CM

## 2011-12-06 DIAGNOSIS — R55 Syncope and collapse: Secondary | ICD-10-CM

## 2011-12-06 DIAGNOSIS — I4891 Unspecified atrial fibrillation: Secondary | ICD-10-CM

## 2011-12-06 DIAGNOSIS — I951 Orthostatic hypotension: Secondary | ICD-10-CM

## 2011-12-06 DIAGNOSIS — I2699 Other pulmonary embolism without acute cor pulmonale: Secondary | ICD-10-CM

## 2011-12-06 DIAGNOSIS — R001 Bradycardia, unspecified: Secondary | ICD-10-CM

## 2011-12-06 DIAGNOSIS — I498 Other specified cardiac arrhythmias: Secondary | ICD-10-CM

## 2011-12-06 MED ORDER — AMIODARONE HCL 200 MG PO TABS: 200.0000 mg | ORAL_TABLET | Freq: Every day | ORAL | Status: DC

## 2011-12-06 MED ORDER — METOPROLOL SUCCINATE ER 25 MG PO TB24: 25.0000 mg | ORAL_TABLET | Freq: Every day | ORAL | Status: DC

## 2011-12-06 NOTE — Assessment & Plan Note (Signed)
Coronary disease is stable. No change in therapy. He does not need cardiac catheterization at this time.

## 2011-12-06 NOTE — Assessment & Plan Note (Signed)
Atrial fibrillation is a new diagnosis. He was very symptomatic with it. He is now on amiodarone.

## 2011-12-06 NOTE — Assessment & Plan Note (Signed)
He is not having any significant orthostasis at this time.

## 2011-12-06 NOTE — Assessment & Plan Note (Signed)
Patient has been on amiodarone 400 mg daily for 8 days. I will continue it for a total of 12 days at this dose. The changes then will be made to 200 mg daily.

## 2011-12-06 NOTE — Assessment & Plan Note (Signed)
He's had no recurrent dizziness or syncope.

## 2011-12-06 NOTE — Assessment & Plan Note (Signed)
There is mild sinus bradycardia. I am decreasing his metoprolol dose to 25 mg daily.

## 2011-12-06 NOTE — Progress Notes (Signed)
HPI  The patient is seen today to followup multiple cardiac issues. Recently he was having palpitations. He also had some chest discomfort. His nuclear scan done February, 2013 revealed no ischemia. He has ejection fraction of 60%. While wearing his event recorder it was noted that he developed rapid atrial fibrillation. Ultimately he was admitted to [facility]. I do not have all of those records yet. I have discussed the entire hospitalization with the patient and his wife. He was admitted and treated for atrial fibrillation. It appears that his INR was not therapeutic and his meds were adjusted. Ultimately he received amiodarone. He converted spontaneously and he was kept on amiodarone 400 mg daily and he returns today. There was question in the past of a rash that was related either to diltiazem or amiodarone. Decision was made to proceed with using amiodarone and he has not had any rash at this time. He's not had any recurrent significant chest pain recently.  Allergies  Allergen Reactions  . Codeine   . Diltiazem Hcl   . Doxycycline   . Infliximab   . Mercaptopurine   . Mesalamine   . Methotrexate   . Metronidazole   . Pyridostigmine Bromide     Current Outpatient Prescriptions  Medication Sig Dispense Refill  . adalimumab (HUMIRA PEN) 40 MG/0.8ML injection Inject 40 mg into the skin.       Marland Kitchen amiodarone (PACERONE) 200 MG tablet Take 1 tablet (200 mg total) by mouth daily.  30 tablet  6  . aspirin 81 MG tablet Take 81 mg by mouth daily.        . Calcium Carbonate-Vit D-Min 600-400 MG-UNIT TABS Take 1 tablet by mouth daily.        . cholecalciferol (VITAMIN D) 1000 UNITS tablet Take 1,000 Units by mouth daily.        Marland Kitchen dimenhyDRINATE (DRAMAMINE) 50 MG tablet Take 50 mg by mouth every 8 (eight) hours as needed.        . fluticasone (FLONASE) 50 MCG/ACT nasal spray 2 sprays by Nasal route daily.        . folic acid (FOLVITE) 1 MG tablet Take 1 tablet by mouth daily.       Marland Kitchen  loratadine (CLARITIN) 10 MG tablet 2 hrs before Humira       . losartan (COZAAR) 50 MG tablet TAKE 1 TABLET BY MOUTH ONCE A DAY  30 tablet  6  . metoprolol succinate (TOPROL-XL) 25 MG 24 hr tablet Take 1 tablet (25 mg total) by mouth daily.  30 tablet  6  . Multiple Vitamin (MULTIVITAMIN) tablet Take 1 tablet by mouth daily.        . nitroGLYCERIN (NITROSTAT) 0.4 MG SL tablet Take 1 tablet (0.4 mg total) by mouth as directed.  25 tablet  6  . omeprazole (PRILOSEC) 40 MG capsule Take 1 capsule (40 mg total) by mouth daily.  30 capsule  6  . predniSONE (DELTASONE) 20 MG tablet Take 20 mg by mouth as needed. 30mg , 30mg ,  two days prior to humira day of humira takes 20mg       . ranitidine (ZANTAC) 150 MG tablet 2 hrs before Humira       . simvastatin (ZOCOR) 40 MG tablet Take 1 tablet by mouth daily.      Marland Kitchen warfarin (COUMADIN) 5 MG tablet Take 1 tablet (5 mg total) by mouth as directed.  40 tablet  3  . DISCONTD: amiodarone (PACERONE) 200 MG tablet Take 1  tablet by mouth Twice daily.        History   Social History  . Marital Status: Married    Spouse Name: N/A    Number of Children: N/A  . Years of Education: N/A   Occupational History  . retired    Social History Main Topics  . Smoking status: Former Smoker    Types: Cigarettes    Quit date: 08/12/1961  . Smokeless tobacco: Never Used   Comment: smoked occ in his teenage yrs  . Alcohol Use: No  . Drug Use: No  . Sexually Active: Not Currently   Other Topics Concern  . Not on file   Social History Narrative  . No narrative on file    Family History  Problem Relation Age of Onset  . Coronary artery disease      Past Medical History  Diagnosis Date  . Syncope and collapse     Evaluation by Dr Turner Daniels; Syncope probably neutrally mediated and modest resting sinus bradycardia. and 16 B. episode of SVT that was either A fib or another type of SVT. Patient improved with combination of holding ACE inhibitor or low BP and reducing  beta blocker for bradycardia  . Hypertension                                                                                                                                                                                                                                                                                                                                                                                                                       .  Coronary artery disease     relook cath 08/12/08 - all 5 grafts are patent  . Crohn's disease     Humara Rx in past  . SVT (supraventricular tachycardia)   . 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 History of benign colon tumor   . Sleep apnea     questionable  . Mitral regurgitation     mild - echo 4/09  . Anxiety   . Gait difficulty     with Crestor (but tolerates simva)  . Pulmonary embolism     2007,b CT scan, IVC fiter placed then because of concern about coumadin use at that time.  . Orthostasis     treated  . Drug therapy     Intermittant steroids   . S/P IVC filter     2007  PE  . Warfarin anticoagulation   . Factor VIII     Factor VIII EXCESS.Marland KitchenMarland KitchenDr Hampton Abbot.Marland Kitchenanother reason for coumadin  . Hx of CABG     2008  . Incomplete RBBB   . Drug therapy     question rash diltiazem, and amio  . Ejection fraction     EF 60-65%, echo, March, 2012, moderate diastolic dysfunction  . 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 Sherene Sires; has chronic shortness of breath  . Atrial fibrillation     April, 2013, new diagnosis    Past Surgical History  Procedure Date  . Coronary artery bypass graft 4/08  . Cholecystectomy   . Back surgery     x2  . Cataract extraction     x2  . Colonoscopy w/ polypectomy     ROS   Patient denies fever, chills, headache, sweats, rash, change in vision, change in hearing, chest pain, cough, nausea vomiting, urinary symptoms. All other systems are reviewed and are negative.  PHYSICAL EXAM   Patient is here with his  wife. He is stable today. He is oriented to person time and place. Affect is normal. There is no jugulovenous distention. Lungs are clear. Respiratory effort is nonlabored. Cardiac exam reveals S1 and S2. There no clicks or significant murmurs. The abdomen is soft. He is overweight. There is no peripheral edema. There are no musculoskeletal deformities. There are no skin rashes.  Filed Vitals:   12/06/11 0903  BP: 136/78  Pulse: 56  Height: 5\' 10"  (1.778 m)  Weight: 215 lb (97.523 kg)   EKG is done today and reviewed by me. There is sinus rhythm with mild sinus bradycardia.  ASSESSMENT & PLAN

## 2011-12-06 NOTE — Patient Instructions (Signed)
Your physician has recommended you make the following change in your medication: Decrease your amiodarone to 200mg  daily on Monday, 12/09/11 and decrease your metoprolol to one tab daily.  Your physician recommends that you schedule a follow-up appointment in: 6 weeks  .

## 2011-12-06 NOTE — Assessment & Plan Note (Signed)
Coumadin is continued. He takes this for multiple reasons. He has a hypercoagulable state. He's had pulmonary and was in the past. He now has shown atrial fibrillation.

## 2011-12-09 ENCOUNTER — Telehealth: Payer: Self-pay | Admitting: Cardiology

## 2011-12-09 NOTE — Telephone Encounter (Signed)
ROI faxed to [facility] @ [phone] 12/09/11/KM

## 2011-12-09 NOTE — Telephone Encounter (Signed)
Records Received From [facility] gave to [facility] 12/09/11/KM

## 2011-12-11 ENCOUNTER — Ambulatory Visit (INDEPENDENT_AMBULATORY_CARE_PROVIDER_SITE_OTHER): Payer: Medicare Other

## 2011-12-11 DIAGNOSIS — Z7901 Long term (current) use of anticoagulants: Secondary | ICD-10-CM

## 2011-12-11 DIAGNOSIS — I2699 Other pulmonary embolism without acute cor pulmonale: Secondary | ICD-10-CM

## 2011-12-11 DIAGNOSIS — I498 Other specified cardiac arrhythmias: Secondary | ICD-10-CM

## 2011-12-11 DIAGNOSIS — I471 Supraventricular tachycardia: Secondary | ICD-10-CM

## 2011-12-11 LAB — POCT INR: INR: 4

## 2011-12-11 MED ORDER — WARFARIN SODIUM 5 MG PO TABS: 5.0000 mg | ORAL_TABLET | ORAL | Status: DC

## 2011-12-16 ENCOUNTER — Encounter: Payer: Self-pay | Admitting: Cardiology

## 2011-12-16 NOTE — Progress Notes (Signed)
   I received a packet of information from the patient's hospitalization in [place] from November 27, 2011. I reviewed the 30 pages. I've chosen not to scan him into the current record. His admission for atrial fibrillation was outlined. I will scan in the two-dimensional echo that was done on November 27, 2011.  [name], MD

## 2011-12-21 ENCOUNTER — Other Ambulatory Visit: Payer: Self-pay | Admitting: Cardiology

## 2011-12-23 ENCOUNTER — Ambulatory Visit (INDEPENDENT_AMBULATORY_CARE_PROVIDER_SITE_OTHER): Payer: Medicare Other

## 2011-12-23 DIAGNOSIS — Z7901 Long term (current) use of anticoagulants: Secondary | ICD-10-CM

## 2011-12-23 DIAGNOSIS — I2699 Other pulmonary embolism without acute cor pulmonale: Secondary | ICD-10-CM

## 2011-12-23 LAB — POCT INR: INR: 2.1

## 2012-01-08 ENCOUNTER — Ambulatory Visit (INDEPENDENT_AMBULATORY_CARE_PROVIDER_SITE_OTHER): Payer: Medicare Other

## 2012-01-08 DIAGNOSIS — Z7901 Long term (current) use of anticoagulants: Secondary | ICD-10-CM

## 2012-01-08 DIAGNOSIS — I2699 Other pulmonary embolism without acute cor pulmonale: Secondary | ICD-10-CM

## 2012-01-21 ENCOUNTER — Encounter: Payer: Self-pay | Admitting: Cardiology

## 2012-01-21 ENCOUNTER — Ambulatory Visit (INDEPENDENT_AMBULATORY_CARE_PROVIDER_SITE_OTHER): Payer: Medicare Other | Admitting: Pharmacist

## 2012-01-21 ENCOUNTER — Ambulatory Visit (INDEPENDENT_AMBULATORY_CARE_PROVIDER_SITE_OTHER): Payer: Medicare Other | Admitting: Cardiology

## 2012-01-21 VITALS — BP 138/66 | HR 65 | Ht 69.0 in | Wt 214.0 lb

## 2012-01-21 DIAGNOSIS — I779 Disorder of arteries and arterioles, unspecified: Secondary | ICD-10-CM

## 2012-01-21 DIAGNOSIS — I10 Essential (primary) hypertension: Secondary | ICD-10-CM

## 2012-01-21 DIAGNOSIS — I2699 Other pulmonary embolism without acute cor pulmonale: Secondary | ICD-10-CM

## 2012-01-21 DIAGNOSIS — R0602 Shortness of breath: Secondary | ICD-10-CM

## 2012-01-21 DIAGNOSIS — I2510 Atherosclerotic heart disease of native coronary artery without angina pectoris: Secondary | ICD-10-CM

## 2012-01-21 DIAGNOSIS — R55 Syncope and collapse: Secondary | ICD-10-CM

## 2012-01-21 DIAGNOSIS — I951 Orthostatic hypotension: Secondary | ICD-10-CM

## 2012-01-21 DIAGNOSIS — I4891 Unspecified atrial fibrillation: Secondary | ICD-10-CM | POA: Insufficient documentation

## 2012-01-21 DIAGNOSIS — D66 Hereditary factor VIII deficiency: Secondary | ICD-10-CM

## 2012-01-21 DIAGNOSIS — Z7901 Long term (current) use of anticoagulants: Secondary | ICD-10-CM

## 2012-01-21 LAB — HEPATIC FUNCTION PANEL
ALT: 37 U/L (ref 0–53)
AST: 30 U/L (ref 0–37)
Total Bilirubin: 0.5 mg/dL (ref 0.3–1.2)
Total Protein: 7 g/dL (ref 6.0–8.3)

## 2012-01-21 NOTE — Assessment & Plan Note (Signed)
I do not have record of recent carotid Dopplers. His ophthalmologist his rates question of some abnormalities in his eye. There are multiple reasons to proceed with carotid Dopplers at this time. This will be done. We'll be in touch with him and information. See him back in 3 months.

## 2012-01-21 NOTE — Assessment & Plan Note (Signed)
Coumadin is continued. 

## 2012-01-21 NOTE — Progress Notes (Signed)
HPI   The patient is doing well. I saw him last December 06, 2011. He been on amiodarone therapy as a new medicine since February. Ultimately we lowered his dose from 400 mg to 200 mg daily. He is remaining stable with this without significant palpitations. He does note that his ophthalmologist saw some findings on his eye exam that concern him. The ophthalmologist told the patient that he recommended followup carotid Dopplers. It is my understanding that a letter was sent. Unfortunately I cannot find it at this time in our electronic medical record. Regardless the information has been past that he needs a carotid Doppler and this would be arranged.  The patient is not having chest pain or shortness of breath. He's feeling well in general. His blood pressures have been stable at home with the systolic in the 135-140 range. This is good for him. He's not had any syncope or presyncope.  Allergies  Allergen Reactions  . Codeine   . Diltiazem Hcl   . Doxycycline   . Infliximab   . Mercaptopurine   . Mesalamine   . Methotrexate   . Metronidazole   . Pyridostigmine Bromide     Current Outpatient Prescriptions  Medication Sig Dispense Refill  . adalimumab (HUMIRA PEN) 40 MG/0.8ML injection Inject 40 mg into the skin.       Marland Kitchen amiodarone (PACERONE) 200 MG tablet Take 1 tablet (200 mg total) by mouth daily.  30 tablet  6  . aspirin 81 MG tablet Take 81 mg by mouth daily.        . Calcium Carbonate-Vit D-Min 600-400 MG-UNIT TABS Take 1 tablet by mouth daily.        . cholecalciferol (VITAMIN D) 1000 UNITS tablet Take 1,000 Units by mouth daily.        Marland Kitchen dimenhyDRINATE (DRAMAMINE) 50 MG tablet Take 50 mg by mouth every 8 (eight) hours as needed.        . fluticasone (FLONASE) 50 MCG/ACT nasal spray 2 sprays by Nasal route daily.        . folic acid (FOLVITE) 1 MG tablet Take 1 tablet by mouth daily.       Marland Kitchen loratadine (CLARITIN) 10 MG tablet 2 hrs before Humira       . losartan (COZAAR) 50 MG  tablet TAKE 1 TABLET BY MOUTH ONCE A DAY  30 tablet  11  . metoprolol succinate (TOPROL-XL) 25 MG 24 hr tablet Take 1 tablet (25 mg total) by mouth daily.  30 tablet  6  . Multiple Vitamin (MULTIVITAMIN) tablet Take 1 tablet by mouth daily.        . nitroGLYCERIN (NITROSTAT) 0.4 MG SL tablet Take 1 tablet (0.4 mg total) by mouth as directed.  25 tablet  6  . omeprazole (PRILOSEC) 40 MG capsule Take 1 capsule (40 mg total) by mouth daily.  30 capsule  6  . predniSONE (DELTASONE) 20 MG tablet Take 20 mg by mouth as needed. 30mg , 30mg ,  two days prior to humira day of humira takes 20mg       . ranitidine (ZANTAC) 150 MG tablet 2 hrs before Humira       . simvastatin (ZOCOR) 40 MG tablet Take 1 tablet by mouth daily.      Marland Kitchen warfarin (COUMADIN) 5 MG tablet Take 1 tablet (5 mg total) by mouth as directed.  40 tablet  3    History   Social History  . Marital Status: Married    Spouse Name:  N/A    Number of Children: N/A  . Years of Education: N/A   Occupational History  . retired    Social History Main Topics  . Smoking status: Former Smoker    Types: Cigarettes    Quit date: 08/12/1961  . Smokeless tobacco: Never Used   Comment: smoked occ in his teenage yrs  . Alcohol Use: No  . Drug Use: No  . Sexually Active: Not Currently   Other Topics Concern  . Not on file   Social History Narrative  . No narrative on file    Family History  Problem Relation Age of Onset  . Coronary artery disease      Past Medical History  Diagnosis Date  . Syncope and collapse     Evaluation by Dr Turner Daniels; Syncope probably neutrally mediated and modest resting sinus bradycardia. and 16 B. episode of SVT that was either A fib or another type of SVT. Patient improved with combination of holding ACE inhibitor or low BP and reducing beta blocker for bradycardia  . Hypertension                                                                                                                                                                                                                                                                                                                                                                                                                        . Coronary artery disease     relook cath 08/12/08 -  all 5 grafts are patent  . Crohn's disease     Humara Rx in past  . SVT (supraventricular tachycardia)   . 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 History of benign colon tumor   . Sleep apnea     questionable  . Mitral regurgitation     mild - echo 4/09  . Anxiety   . Gait difficulty     with Crestor (but tolerates simva)  . Pulmonary embolism     2007,b CT scan, IVC fiter placed then because of concern about coumadin use at that time.  . Orthostasis     treated  . Drug therapy     Intermittant steroids   . S/P IVC filter     2007  PE  . Warfarin anticoagulation   . Factor VIII     Factor VIII EXCESS.Marland KitchenMarland KitchenDr Hampton Abbot.Marland Kitchenanother reason for coumadin  . Hx of CABG     2008  . Incomplete RBBB   . Drug therapy     question rash diltiazem, and amio  . Ejection fraction     EF 60-65%, echo, March, 2012, moderate diastolic dysfunction  . 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 Sherene Sires; has chronic shortness of breath  . Atrial fibrillation     April, 2013, new diagnosis  . Bradycardia     Sinus bradycardia, asymptomatic, April, 2013    Past Surgical History  Procedure Date  . Coronary artery bypass graft 4/08  . Cholecystectomy   . Back surgery     x2  . Cataract extraction     x2  . Colonoscopy w/ polypectomy     ROS   Patient denies fever, chills, headache, sweats, rash, change in vision, change in hearing, chest pain, cough, nausea vomiting, urinary symptoms. All of the systems are reviewed and are negative.  PHYSICAL EXAM   Patients here with his wife. He is oriented to person time and place. Affect is normal. There is no jugulovenous distention. Lungs are  clear. Respiratory effort is nonlabored. Cardiac exam reveals S1 and S2. There no clicks or significant murmurs. The abdomen is soft. There is no significant peripheral edema. There no musculoskeletal deformities. There are no skin rashes.  Filed Vitals:   01/21/12 1054  BP: 138/66  Pulse: 65  Height: 5\' 9"  (1.753 m)  Weight: 214 lb (97.07 kg)    ASSESSMENT & PLAN

## 2012-01-21 NOTE — Assessment & Plan Note (Signed)
He is holding stable rhythm without atrial fibrillation. He is on amiodarone 200 mg. This dose will be continued at this time. We will check TSH and liver function today.

## 2012-01-21 NOTE — Assessment & Plan Note (Signed)
Coronary disease is stable. His nuclear scan was done February, 2013 with no ischemia. No further workup.

## 2012-01-21 NOTE — Patient Instructions (Signed)
Your physician recommends that you return for lab work in: today (tsh, liver)  Your physician recommends that you schedule a follow-up appointment in: 3 months.  Your physician has requested that you have a carotid duplex. This test is an ultrasound of the carotid arteries in your neck. It looks at blood flow through these arteries that supply the brain with blood. Allow one hour for this exam. There are no restrictions or special instructions.

## 2012-01-21 NOTE — Assessment & Plan Note (Signed)
He's not having any significant orthostasis at this time.

## 2012-01-21 NOTE — Assessment & Plan Note (Signed)
He has not had any recurrent syncope or presyncope.

## 2012-01-21 NOTE — Assessment & Plan Note (Signed)
The patient had some shortness of breath in the past. He has been seen by the pulmonary team. He is tolerating amiodarone. This will have to be followed carefully. He's not having shortness of breath now.

## 2012-01-21 NOTE — Assessment & Plan Note (Signed)
Blood pressure is controlled. No change in therapy. 

## 2012-01-23 ENCOUNTER — Encounter (INDEPENDENT_AMBULATORY_CARE_PROVIDER_SITE_OTHER): Payer: Medicare Other

## 2012-01-23 DIAGNOSIS — R55 Syncope and collapse: Secondary | ICD-10-CM

## 2012-02-03 ENCOUNTER — Encounter: Payer: Self-pay | Admitting: Cardiology

## 2012-02-19 ENCOUNTER — Ambulatory Visit (INDEPENDENT_AMBULATORY_CARE_PROVIDER_SITE_OTHER): Payer: Medicare Other

## 2012-02-19 DIAGNOSIS — I2699 Other pulmonary embolism without acute cor pulmonale: Secondary | ICD-10-CM

## 2012-02-19 DIAGNOSIS — Z7901 Long term (current) use of anticoagulants: Secondary | ICD-10-CM

## 2012-02-19 DIAGNOSIS — D66 Hereditary factor VIII deficiency: Secondary | ICD-10-CM

## 2012-04-01 ENCOUNTER — Ambulatory Visit (INDEPENDENT_AMBULATORY_CARE_PROVIDER_SITE_OTHER): Payer: Medicare Other

## 2012-04-01 DIAGNOSIS — Z7901 Long term (current) use of anticoagulants: Secondary | ICD-10-CM

## 2012-04-01 DIAGNOSIS — I2699 Other pulmonary embolism without acute cor pulmonale: Secondary | ICD-10-CM

## 2012-04-01 DIAGNOSIS — D66 Hereditary factor VIII deficiency: Secondary | ICD-10-CM

## 2012-04-01 LAB — POCT INR: INR: 3.1

## 2012-04-21 ENCOUNTER — Encounter: Payer: Self-pay | Admitting: Cardiology

## 2012-04-23 ENCOUNTER — Encounter: Payer: Self-pay | Admitting: Cardiology

## 2012-04-23 ENCOUNTER — Ambulatory Visit (INDEPENDENT_AMBULATORY_CARE_PROVIDER_SITE_OTHER): Payer: Medicare Other | Admitting: Cardiology

## 2012-04-23 VITALS — BP 140/68 | HR 59 | Resp 18 | Ht 70.0 in | Wt 210.8 lb

## 2012-04-23 DIAGNOSIS — I951 Orthostatic hypotension: Secondary | ICD-10-CM

## 2012-04-23 DIAGNOSIS — I10 Essential (primary) hypertension: Secondary | ICD-10-CM

## 2012-04-23 DIAGNOSIS — I2510 Atherosclerotic heart disease of native coronary artery without angina pectoris: Secondary | ICD-10-CM

## 2012-04-23 DIAGNOSIS — R0602 Shortness of breath: Secondary | ICD-10-CM

## 2012-04-23 DIAGNOSIS — I779 Disorder of arteries and arterioles, unspecified: Secondary | ICD-10-CM

## 2012-04-23 DIAGNOSIS — I4891 Unspecified atrial fibrillation: Secondary | ICD-10-CM

## 2012-04-23 DIAGNOSIS — R55 Syncope and collapse: Secondary | ICD-10-CM

## 2012-04-23 NOTE — Assessment & Plan Note (Signed)
He has not had any recurring episodes.

## 2012-04-23 NOTE — Progress Notes (Signed)
HPI  The patient is seen for cardiology followup. I saw him last in June, 2013. Earlier in the year he had rapid atrial fibrillation and was treated with amiodarone. He's done very well. Over time I have lowered his dose from 400-200 mg daily. In June we checked liver function and thyroid functions and they were normal. He's not had any recurrent symptomatic palpitations. He continues to have mild chronic shortness of breath. He has been evaluated by the pulmonary team also. There is no reason to believe that this is related to his amiodarone. It is stable.  Allergies  Allergen Reactions  . Codeine   . Diltiazem Hcl   . Doxycycline   . Infliximab   . Mercaptopurine   . Mesalamine   . Methotrexate   . Metronidazole   . Pyridostigmine Bromide     Current Outpatient Prescriptions  Medication Sig Dispense Refill  . adalimumab (HUMIRA PEN) 40 MG/0.8ML injection Inject 40 mg into the skin.       Marland Kitchen amiodarone (PACERONE) 200 MG tablet Take 1 tablet (200 mg total) by mouth daily.  30 tablet  6  . amoxicillin-clavulanate (AUGMENTIN) 875-125 MG per tablet       . aspirin 81 MG tablet Take 81 mg by mouth daily.        . Calcium Carbonate-Vit D-Min 600-400 MG-UNIT TABS Take 1 tablet by mouth daily.        . cholecalciferol (VITAMIN D) 1000 UNITS tablet Take 1,000 Units by mouth daily.        Marland Kitchen dimenhyDRINATE (DRAMAMINE) 50 MG tablet Take 50 mg by mouth every 8 (eight) hours as needed.        . fluticasone (FLONASE) 50 MCG/ACT nasal spray 2 sprays by Nasal route daily.        . folic acid (FOLVITE) 1 MG tablet Take 1 tablet by mouth daily.       Marland Kitchen loratadine (CLARITIN) 10 MG tablet 2 hrs before Humira       . losartan (COZAAR) 50 MG tablet TAKE 1 TABLET BY MOUTH ONCE A DAY  30 tablet  11  . metoprolol succinate (TOPROL-XL) 25 MG 24 hr tablet Take 1 tablet (25 mg total) by mouth daily.  30 tablet  6  . Multiple Vitamin (MULTIVITAMIN) tablet Take 1 tablet by mouth daily.        . nitroGLYCERIN  (NITROSTAT) 0.4 MG SL tablet Take 1 tablet (0.4 mg total) by mouth as directed.  25 tablet  6  . omeprazole (PRILOSEC) 40 MG capsule Take 1 capsule (40 mg total) by mouth daily.  30 capsule  6  . predniSONE (DELTASONE) 20 MG tablet Take 20 mg by mouth as needed. 30mg , 30mg ,  two days prior to humira day of humira takes 20mg       . ranitidine (ZANTAC) 150 MG tablet 2 hrs before Humira       . simvastatin (ZOCOR) 40 MG tablet Take 1 tablet by mouth daily.      Marland Kitchen warfarin (COUMADIN) 5 MG tablet Take 1 tablet (5 mg total) by mouth as directed.  40 tablet  3    History   Social History  . Marital Status: Married    Spouse Name: N/A    Number of Children: N/A  . Years of Education: N/A   Occupational History  . retired    Social History Main Topics  . Smoking status: Former Smoker    Types: Cigarettes    Quit date: 08/12/1961  .  Smokeless tobacco: Never Used   Comment: smoked occ in his teenage yrs  . Alcohol Use: No  . Drug Use: No  . Sexually Active: Not Currently   Other Topics Concern  . Not on file   Social History Narrative  . No narrative on file    Family History  Problem Relation Age of Onset  . Coronary artery disease      Past Medical History  Diagnosis Date  . Syncope and collapse     Evaluation by Dr Turner Daniels; Syncope probably neutrally mediated and modest resting sinus bradycardia. and 16 B. episode of SVT that was either A fib or another type of SVT. Patient improved with combination of holding ACE inhibitor or low BP and reducing beta blocker for bradycardia  . Hypertension                                                                                                                                                                                                                                                                                                                                                                                                                        . Coronary artery disease     relook cath 08/12/08 - all 5 grafts are patent  . Crohn's disease     Humara Rx in past  . SVT (supraventricular tachycardia)   . 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 History of benign colon tumor   . Sleep apnea  questionable  . Mitral regurgitation     mild - echo 4/09  . Anxiety   . Gait difficulty     with Crestor (but tolerates simva)  . Pulmonary embolism     2007,b CT scan, IVC fiter placed then because of concern about coumadin use at that time.  . Orthostasis     treated  . Drug therapy     Intermittant steroids   . S/P IVC filter     2007  PE  . Warfarin anticoagulation   . Factor VIII     Factor VIII EXCESS.Marland KitchenMarland KitchenDr Hampton Abbot.Marland Kitchenanother reason for coumadin  . Hx of CABG     2008  . Incomplete RBBB   . Drug therapy     question rash diltiazem, and amio  . Ejection fraction     EF 60-65%, echo, March, 2012, moderate diastolic dysfunction  . 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 Sherene Sires; has chronic shortness of breath  . Atrial fibrillation     April, 2013, new diagnosis  . Bradycardia     Sinus bradycardia, asymptomatic, April, 2013  . Carotid artery disease     Dopplers to be checked, June, 2013    Past Surgical History  Procedure Date  . Coronary artery bypass graft 4/08  . Cholecystectomy   . Back surgery     x2  . Cataract extraction     x2  . Colonoscopy w/ polypectomy     ROS   Patient denies fever, chills, headache, sweats, rash, change in vision, change in hearing, chest pain, cough, nausea vomiting, urinary symptoms. All other systems are reviewed and are negative.  PHYSICAL EXAM  Patient is oriented to person time and place. Affect is normal. He's here with his wife. He looks quite good. There is no jugulovenous distention. Lungs are clear. Respiratory effort is nonlabored. Cardiac exam reveals S1 and S2. There no clicks or significant murmurs. The abdomen is soft. There is no peripheral edema.  Filed Vitals:     04/23/12 1127  BP: 140/68  Pulse: 59  Resp: 18  Height: 5\' 10"  (1.778 m)  Weight: 210 lb 12.8 oz (95.618 kg)  SpO2: 98%     ASSESSMENT & PLAN

## 2012-04-23 NOTE — Assessment & Plan Note (Signed)
Blood pressure is controlled. No change in therapy. 

## 2012-04-23 NOTE — Assessment & Plan Note (Signed)
No further significant orthostatic problems.

## 2012-04-23 NOTE — Patient Instructions (Addendum)
Your physician wants you to follow-up in:  6 months. You will receive a reminder letter in the mail two months in advance. If you don't receive a letter, please call our office to schedule the follow-up appointment.   

## 2012-04-23 NOTE — Assessment & Plan Note (Signed)
The patient has ongoing stable shortness of breath. There is no significant change. He does not have CHF. No further workup.

## 2012-04-23 NOTE — Assessment & Plan Note (Signed)
Because of a question of some abnormality in his eye we did a carotid Doppler to be complete. The study showed only minimal disease.

## 2012-04-23 NOTE — Assessment & Plan Note (Signed)
No recurrent significant atrial fibrillation while on amiodarone. I will keep him on 200 mg at this time. In the future we may try to very slowly taper him a little.

## 2012-04-23 NOTE — Assessment & Plan Note (Signed)
Coronary status is stable. Nuclear study in February, 2013 revealed no ischemia in good LV function.

## 2012-05-06 ENCOUNTER — Ambulatory Visit (INDEPENDENT_AMBULATORY_CARE_PROVIDER_SITE_OTHER): Payer: Medicare Other

## 2012-05-06 DIAGNOSIS — D66 Hereditary factor VIII deficiency: Secondary | ICD-10-CM

## 2012-05-06 DIAGNOSIS — Z7901 Long term (current) use of anticoagulants: Secondary | ICD-10-CM

## 2012-05-06 DIAGNOSIS — I2699 Other pulmonary embolism without acute cor pulmonale: Secondary | ICD-10-CM

## 2012-05-06 LAB — POCT INR: INR: 2.8

## 2012-05-16 ENCOUNTER — Emergency Department: Payer: Self-pay | Admitting: Emergency Medicine

## 2012-05-16 LAB — COMPREHENSIVE METABOLIC PANEL
Albumin: 3.6 g/dL (ref 3.4–5.0)
BUN: 16 mg/dL (ref 7–18)
Bilirubin,Total: 0.6 mg/dL (ref 0.2–1.0)
Chloride: 99 mmol/L (ref 98–107)
Creatinine: 1.14 mg/dL (ref 0.60–1.30)
EGFR (African American): >60
Osmolality: 265 (ref 275–301)
Potassium: 4.4 mmol/L (ref 3.5–5.1)
SGOT(AST): 40 U/L — ABNORMAL HIGH (ref 15–37)
SGPT (ALT): 64 U/L (ref 12–78)
Sodium: 132 mmol/L — ABNORMAL LOW (ref 136–145)
Total Protein: 7.7 g/dL (ref 6.4–8.2)

## 2012-05-16 LAB — CBC
HCT: 43 % (ref 40.0–52.0)
HGB: 14.6 g/dL (ref 13.0–18.0)
MCH: 32.3 pg (ref 26.0–34.0)
MCHC: 34.1 g/dL (ref 32.0–36.0)
MCV: 95 fL (ref 80–100)
Platelet: 304 10*3/uL (ref 150–440)
RBC: 4.53 10*6/uL (ref 4.40–5.90)

## 2012-05-20 ENCOUNTER — Ambulatory Visit: Payer: Medicare Other | Admitting: Physician Assistant

## 2012-05-25 ENCOUNTER — Encounter: Payer: Self-pay | Admitting: Cardiology

## 2012-05-26 ENCOUNTER — Telehealth: Payer: Self-pay | Admitting: Cardiology

## 2012-05-26 NOTE — Telephone Encounter (Signed)
Sarah from Dr. Elisabeth Cara office called regarding pt is having syncope. Pt is to have a MRI and carotid done and I asked that they send the report stat and let Dr. Myrtis Ser review.

## 2012-05-27 ENCOUNTER — Ambulatory Visit: Payer: Medicare Other | Admitting: Physician Assistant

## 2012-05-27 NOTE — Telephone Encounter (Signed)
Pt was notified of date and time of appt.Marland Kitchen

## 2012-05-27 NOTE — Telephone Encounter (Signed)
Mr [name] [name] states he passed out two weeks ago while "putting up weatherstripping".  No further problems per pt.  No increased sob, dizziness, pain, etc.  Dr Sullivan Lone is requesting that pt be seen within 2 weeks.  Appt scheduled for 06/02/12 with Dr Myrtis Ser.  MRI scheduled at [facility] on Monday, 06/01/12.

## 2012-06-01 ENCOUNTER — Ambulatory Visit: Payer: Self-pay | Admitting: Family Medicine

## 2012-06-01 ENCOUNTER — Encounter: Payer: Self-pay | Admitting: Cardiology

## 2012-06-02 ENCOUNTER — Ambulatory Visit (INDEPENDENT_AMBULATORY_CARE_PROVIDER_SITE_OTHER): Payer: Medicare Other | Admitting: Cardiology

## 2012-06-02 ENCOUNTER — Encounter: Payer: Self-pay | Admitting: Cardiology

## 2012-06-02 ENCOUNTER — Ambulatory Visit (INDEPENDENT_AMBULATORY_CARE_PROVIDER_SITE_OTHER)
Admission: RE | Admit: 2012-06-02 | Discharge: 2012-06-02 | Disposition: A | Discharge status: Home / Self Care | Payer: Medicare Other | Source: Ambulatory Visit | Attending: Cardiology | Admitting: Cardiology

## 2012-06-02 VITALS — BP 118/58 | HR 53 | Resp 16 | Ht 70.0 in | Wt 209.0 lb

## 2012-06-02 DIAGNOSIS — I498 Other specified cardiac arrhythmias: Secondary | ICD-10-CM

## 2012-06-02 DIAGNOSIS — R55 Syncope and collapse: Secondary | ICD-10-CM

## 2012-06-02 DIAGNOSIS — R05 Cough: Secondary | ICD-10-CM | POA: Insufficient documentation

## 2012-06-02 DIAGNOSIS — R259 Unspecified abnormal involuntary movements: Secondary | ICD-10-CM

## 2012-06-02 DIAGNOSIS — R251 Tremor, unspecified: Secondary | ICD-10-CM

## 2012-06-02 DIAGNOSIS — R059 Cough, unspecified: Secondary | ICD-10-CM

## 2012-06-02 DIAGNOSIS — I4891 Unspecified atrial fibrillation: Secondary | ICD-10-CM

## 2012-06-02 DIAGNOSIS — R0602 Shortness of breath: Secondary | ICD-10-CM

## 2012-06-02 DIAGNOSIS — Z9229 Personal history of other drug therapy: Secondary | ICD-10-CM

## 2012-06-02 DIAGNOSIS — I2510 Atherosclerotic heart disease of native coronary artery without angina pectoris: Secondary | ICD-10-CM

## 2012-06-02 NOTE — Assessment & Plan Note (Signed)
He appears to be holding sinus rhythm. He is on amiodarone.

## 2012-06-02 NOTE — Assessment & Plan Note (Signed)
The patient mentions a dry cough. He is not on an ACE inhibitor. He is on an ARB. We will await his chest x-ray results.

## 2012-06-02 NOTE — Progress Notes (Signed)
   Chest x-ray was done today after the office visit. There was no CHF. Therefore the patient will lower his beta blocker dose and stop it as I outlined in the office. I will plan to see him back in 3-4 weeks. I will not in any diuretics to his regimen.

## 2012-06-02 NOTE — Assessment & Plan Note (Signed)
The patient has been assessed by our pulmonary doctors in the past. He has trace edema. I am not convinced that he has CHF at this time. Also I'm hesitant to push a diuretic with his recent syncope. He has had some orthostasis in the past. Chest x-ray will be obtained.

## 2012-06-02 NOTE — Progress Notes (Signed)
Patient ID: [name] [name], male   DOB: July 15, 1941, 71 y.o.   MRN: [id number]   HPI  Patient is seen today for the evaluation of syncope. He has multiple cardiac problems. He had had syncope in the past. After complete evaluation including evaluation by Dr. 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 There may have been a component of orthostasis. His meds were adjusted. He has done well. Recently he was doing some work around the front door of his home. He began to feel poorly with some nausea. He then sat down and after this he passed out  With a syncopal episode. His wife did not see him pass out. However she found him on the floor. He was mildly confused. Then over time he began to stabilize completely. He's had an MRI ordered by his primary physician. I am told by the patient and his wife that this showed no acute abnormality. He did not notice any palpitations.  Allergies  Allergen Reactions  . Codeine   . Diltiazem Hcl   . Doxycycline   . Infliximab   . Mercaptopurine   . Mesalamine   . Methotrexate   . Metronidazole   . Pyridostigmine Bromide     Current Outpatient Prescriptions  Medication Sig Dispense Refill  . adalimumab (HUMIRA PEN) 40 MG/0.8ML injection Inject 40 mg into the skin.       Marland Kitchen ALPRAZolam (XANAX) 0.5 MG tablet as needed.      Marland Kitchen amiodarone (PACERONE) 200 MG tablet Take 1 tablet (200 mg total) by mouth daily.  30 tablet  6  . aspirin 81 MG tablet Take 81 mg by mouth daily.        . Calcium Carbonate-Vit D-Min 600-400 MG-UNIT TABS Take 1 tablet by mouth daily.        . cholecalciferol (VITAMIN D) 1000 UNITS tablet Take 1,000 Units by mouth daily.        Marland Kitchen dimenhyDRINATE (DRAMAMINE) 50 MG tablet Take 50 mg by mouth every 8 (eight) hours as needed.        . fluticasone (FLONASE) 50 MCG/ACT nasal spray 2 sprays by Nasal route daily.        . folic acid (FOLVITE) 1 MG tablet Take 1 tablet by mouth daily.       Marland Kitchen levothyroxine  (SYNTHROID, LEVOTHROID) 50 MCG tablet Take 1 tablet by mouth daily.      Marland Kitchen loratadine (CLARITIN) 10 MG tablet 2 hrs before Humira       . losartan (COZAAR) 50 MG tablet TAKE 1 TABLET BY MOUTH ONCE A DAY  30 tablet  11  . metoprolol succinate (TOPROL-XL) 25 MG 24 hr tablet Take 1 tablet (25 mg total) by mouth daily.  30 tablet  6  . Multiple Vitamin (MULTIVITAMIN) tablet Take 1 tablet by mouth daily.        . nitroGLYCERIN (NITROSTAT) 0.4 MG SL tablet Take 1 tablet (0.4 mg total) by mouth as directed.  25 tablet  6  . omeprazole (PRILOSEC) 40 MG capsule Take 1 capsule (40 mg total) by mouth daily.  30 capsule  6  . predniSONE (DELTASONE) 20 MG tablet Take 20 mg by mouth as needed. 30mg , 30mg ,  two days prior to humira day of humira takes 20mg       . ranitidine (ZANTAC) 150 MG tablet 2 hrs before Humira       . simvastatin (ZOCOR) 40 MG tablet Take 1 tablet by mouth daily.      Marland Kitchen  warfarin (COUMADIN) 5 MG tablet Take 1 tablet (5 mg total) by mouth as directed.  40 tablet  3    History   Social History  . Marital Status: Married    Spouse Name: N/A    Number of Children: N/A  . Years of Education: N/A   Occupational History  . retired    Social History Main Topics  . Smoking status: Former Smoker    Types: Cigarettes    Quit date: 08/12/1961  . Smokeless tobacco: Never Used   Comment: smoked occ in his teenage yrs  . Alcohol Use: No  . Drug Use: No  . Sexually Active: Not Currently   Other Topics Concern  . Not on file   Social History Narrative  . No narrative on file    Family History  Problem Relation Age of Onset  . Coronary artery disease      Past Medical History  Diagnosis Date  . Syncope and collapse     Evaluation by Dr Turner Daniels; Syncope probably neutrally mediated and modest resting sinus bradycardia. and 16 B. episode of SVT that was either A fib or another type of SVT. Patient improved with combination of holding ACE inhibitor or low BP and reducing beta blocker  for bradycardia  . Hypertension                                                                                                                                                                                                                                                                                                                                                                                                                       .  Coronary artery disease     relook cath 08/12/08 - all 5 grafts are patent  . Crohn's disease     Humara Rx in past  . SVT (supraventricular tachycardia)   . 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 History of benign colon tumor   . Sleep apnea     questionable  . Mitral regurgitation     mild - echo 4/09  . Anxiety   . Gait difficulty     with Crestor (but tolerates simva)  . Pulmonary embolism     2007,b CT scan, IVC fiter placed then because of concern about coumadin use at that time.  . Orthostasis     treated  . Drug therapy     Intermittant steroids   . S/P IVC filter     2007  PE  . Warfarin anticoagulation   . Factor VIII     Factor VIII EXCESS.Marland KitchenMarland KitchenDr Hampton Abbot.Marland Kitchenanother reason for coumadin  . Hx of CABG     2008  . Incomplete RBBB   . Drug therapy     question rash diltiazem, and amio  . Ejection fraction     EF 60-65%, echo, March, 2012, moderate diastolic dysfunction  . 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 Sherene Sires; has chronic shortness of breath  . Atrial fibrillation     April, 2013, new diagnosis  . Bradycardia     Sinus bradycardia, asymptomatic, April, 2013  . Carotid artery disease     Dopplers to be checked, June, 2013    Past Surgical History  Procedure Date  . Coronary artery bypass graft 4/08  . Cholecystectomy   . Back surgery     x2  . Cataract extraction     x2  . Colonoscopy w/ polypectomy     Patient Active Problem List  Diagnosis  . CROHN'S DISEASE  . Shortness of breath  . Syncope and collapse  . Hypertension   . Coronary artery disease  . Mitral regurgitation  . Pulmonary embolism  . Orthostasis  . Drug therapy  . S/P IVC filter  . Warfarin anticoagulation  . Factor VIII  . Hx of CABG  . Incomplete RBBB  . Drug therapy  . Ejection fraction  . Phrenic nerve palsy  . Bradycardia  . Hx of amiodarone therapy  . Atrial fibrillation  . Carotid artery disease    ROS   Patient denies fever, chills, headache, sweats, rash, change in vision, change in hearing, chest pain, cough, nausea vomiting, urinary symptoms. All other systems are reviewed and are negative.  PHYSICAL EXAM   Patient is oriented to person time and place. Affect is normal. He's here with his wife. Head is atraumatic. There is no jugulovenous distention. Lungs are clear. Respiratory effort is nonlabored. Cardiac exam reveals S1 and S2. The rhythm is regular. The abdomen is soft. He may have very slight peripheral edema. There no musculoskeletal deformities. The areas on his skin on his legs that have been treated over time are stable at this point.  The patient is also having some shortness of breath. There is also a mild dry cough. He is on amiodarone.  Filed Vitals:   06/02/12 1201  BP: 118/58  Pulse: 53  Resp: 16  Height: 5\' 10"  (1.778 m)  Weight: 209 lb (94.802 kg)   EKG is done today and reviewed by me. There sinus bradycardia with a rate of 53. There is no other acute abnormality.  ASSESSMENT &  PLAN

## 2012-06-02 NOTE — Assessment & Plan Note (Signed)
Patient's wife mentioned that he has a mild tremor. I am hoping that this will not be related to amiodarone. This will be followed.

## 2012-06-02 NOTE — Patient Instructions (Addendum)
Your physician has recommended you make the following change in your medication: Decrease your Metoprolol to one-half tab for 5 days and then stop taking it.  A chest x-ray takes a picture of the organs and structures inside the chest, including the heart, lungs, and blood vessels. This test can show several things, including, whether the heart is enlarges; whether fluid is building up in the lungs; and whether pacemaker / defibrillator leads are still in place.  Please go to the [employer] office for this ([address])  Your physician recommends that you schedule a follow-up appointment in: We will call you later this afternoon with a date and time

## 2012-06-02 NOTE — Assessment & Plan Note (Signed)
The patient's event sounds like neurally mediated syncope. He has had some bradycardia in the past. He has sinus bradycardia today. His bradycardia may now be worse with the addition of his amiodarone. I've chosen to taper his low dose metoprolol off over the next week. We will then follow his heart rate. I've chosen not to place another monitor at this time. I'm hoping that he is not having tachyarrhythmias or post termination pauses from intermittent atrial fibrillation.

## 2012-06-02 NOTE — Assessment & Plan Note (Signed)
The patient is on amiodarone. I've chosen not to lower his dose as of today. He'll have a chest x-ray to be sure we are not seeing any marked abnormalities.

## 2012-06-02 NOTE — Assessment & Plan Note (Signed)
Coronary disease appears to be stable today. No change in therapy.

## 2012-06-03 ENCOUNTER — Encounter: Payer: Self-pay | Admitting: Cardiology

## 2012-06-17 ENCOUNTER — Ambulatory Visit (INDEPENDENT_AMBULATORY_CARE_PROVIDER_SITE_OTHER): Payer: Medicare Other

## 2012-06-17 DIAGNOSIS — I2699 Other pulmonary embolism without acute cor pulmonale: Secondary | ICD-10-CM

## 2012-06-17 DIAGNOSIS — D66 Hereditary factor VIII deficiency: Secondary | ICD-10-CM

## 2012-06-17 DIAGNOSIS — Z7901 Long term (current) use of anticoagulants: Secondary | ICD-10-CM

## 2012-06-27 ENCOUNTER — Other Ambulatory Visit: Payer: Self-pay | Admitting: Physician Assistant

## 2012-07-02 ENCOUNTER — Ambulatory Visit: Payer: Medicare Other | Admitting: Cardiology

## 2012-07-21 ENCOUNTER — Ambulatory Visit (INDEPENDENT_AMBULATORY_CARE_PROVIDER_SITE_OTHER): Payer: Medicare Other | Admitting: Cardiology

## 2012-07-21 ENCOUNTER — Encounter: Payer: Self-pay | Admitting: Cardiology

## 2012-07-21 VITALS — BP 132/80 | HR 68 | Ht 70.0 in | Wt 209.0 lb

## 2012-07-21 DIAGNOSIS — I4891 Unspecified atrial fibrillation: Secondary | ICD-10-CM

## 2012-07-21 DIAGNOSIS — R55 Syncope and collapse: Secondary | ICD-10-CM

## 2012-07-21 DIAGNOSIS — Z9229 Personal history of other drug therapy: Secondary | ICD-10-CM

## 2012-07-21 DIAGNOSIS — R0602 Shortness of breath: Secondary | ICD-10-CM

## 2012-07-21 DIAGNOSIS — I10 Essential (primary) hypertension: Secondary | ICD-10-CM

## 2012-07-21 MED ORDER — AMIODARONE HCL 200 MG PO TABS: ORAL_TABLET | ORAL | Status: DC

## 2012-07-21 NOTE — Assessment & Plan Note (Signed)
Blood pressure stable. No change in therapy. 

## 2012-07-21 NOTE — Assessment & Plan Note (Signed)
Shortness of breath is stable. Recent chest x-ray showed no new change. No further workup.

## 2012-07-21 NOTE — Assessment & Plan Note (Signed)
No recurrent episode since stopping his beta blocker recently. Continue to follow

## 2012-07-21 NOTE — Patient Instructions (Addendum)
Your physician recommends that you return for lab work in: today [facility])  Your physician wants you to follow-up in: 3 months.   You will receive a reminder letter in the mail two months in advance. If you don't receive a letter, please call our office to schedule the follow-up appointment.   Your physician has recommended you make the following change in your medication: DECREASE your amiodarone to 200mg  (1 tab) alternating with 100mg  (1/2 tab) every other day, otherwise continue on your current medications as directed. Please refer to the Current Medication list given to you today.

## 2012-07-21 NOTE — Progress Notes (Signed)
HPI  Patient is doing well since the last visit. He is seen for followup of many problems. He has coronary disease and a history of syncope. He also has atrial fibrillation on amiodarone. Most recently I had seen him after he had an event that was probably neurocardiogenic syncope. He was working outside near his front door. He did have an MRI that showed no acute abnormality. He did have some bradycardia. Because he is on amiodarone I stopped his metoprolol slowly. He's not had any recurrence since then. He did have a chest x-ray when I saw him last and it showed no acute abnormality. There was no evidence of CHF. He has some mild shortness of breath but otherwise he's stable.  Allergies  Allergen Reactions  . Codeine   . Diltiazem Hcl   . Doxycycline   . Infliximab   . Mercaptopurine   . Mesalamine   . Methotrexate   . Metronidazole   . Pyridostigmine Bromide     Current Outpatient Prescriptions  Medication Sig Dispense Refill  . adalimumab (HUMIRA PEN) 40 MG/0.8ML injection Inject 40 mg into the skin.       Marland Kitchen ALPRAZolam (XANAX) 0.5 MG tablet as needed.      Marland Kitchen amiodarone (PACERONE) 200 MG tablet Take 1 tablet (200 mg total) by mouth daily.  30 tablet  6  . aspirin 81 MG tablet Take 81 mg by mouth daily.        . Calcium Carbonate-Vit D-Min 600-400 MG-UNIT TABS Take 1 tablet by mouth daily.        . cholecalciferol (VITAMIN D) 1000 UNITS tablet Take 1,000 Units by mouth daily.        Marland Kitchen dimenhyDRINATE (DRAMAMINE) 50 MG tablet Take 50 mg by mouth every 8 (eight) hours as needed.        . fluticasone (FLONASE) 50 MCG/ACT nasal spray 2 sprays by Nasal route daily.        . folic acid (FOLVITE) 1 MG tablet Take 1 tablet by mouth daily.       Marland Kitchen levothyroxine (SYNTHROID, LEVOTHROID) 50 MCG tablet Take 1 tablet by mouth daily.      Marland Kitchen loratadine (CLARITIN) 10 MG tablet 2 hrs before Humira       . losartan (COZAAR) 50 MG tablet TAKE 1 TABLET BY MOUTH ONCE A DAY  30 tablet  11  . Multiple  Vitamin (MULTIVITAMIN) tablet Take 1 tablet by mouth daily.        . nitroGLYCERIN (NITROSTAT) 0.4 MG SL tablet Take 1 tablet (0.4 mg total) by mouth as directed.  25 tablet  6  . omeprazole (PRILOSEC) 40 MG capsule TAKE 1 CAPSULE BY MOUTH DAILY.  30 capsule  6  . predniSONE (DELTASONE) 20 MG tablet Take 20 mg by mouth as needed. 30mg , 30mg ,  two days prior to humira day of humira takes 20mg       . ranitidine (ZANTAC) 150 MG tablet 2 hrs before Humira       . simvastatin (ZOCOR) 40 MG tablet Take 1 tablet by mouth daily.      Marland Kitchen warfarin (COUMADIN) 5 MG tablet Take 1 tablet (5 mg total) by mouth as directed.  40 tablet  3    History   Social History  . Marital Status: Married    Spouse Name: N/A    Number of Children: N/A  . Years of Education: N/A   Occupational History  . retired    Social History Main Topics  .  Smoking status: Former Smoker    Types: Cigarettes    Quit date: 08/12/1961  . Smokeless tobacco: Never Used     Comment: smoked occ in his teenage yrs  . Alcohol Use: No  . Drug Use: No  . Sexually Active: Not Currently   Other Topics Concern  . Not on file   Social History Narrative  . No narrative on file    Family History  Problem Relation Age of Onset  . Coronary artery disease      Past Medical History  Diagnosis Date  . Syncope and collapse     Evaluation by Dr Turner Daniels; Syncope probably neutrally mediated and modest resting sinus bradycardia. and 15 B. episode of SVT that was either A fib or another type of SVT. Patient improved with combination of holding ACE inhibitor or low BP and reducing beta blocker for bradycardia  . Hypertension                                                                                                                                                                                                                                                                                                                                                                                                                        . Coronary artery disease     relook cath 08/12/08 - all 5 grafts are patent  . Crohn's disease     Humara Rx in past  . SVT (supraventricular tachycardia)   . 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 History of benign colon tumor   . Sleep apnea     questionable  . Mitral regurgitation     mild - echo 4/09  . Anxiety   . Gait difficulty     with Crestor (but tolerates simva)  . Pulmonary embolism     2007,b CT scan, IVC fiter placed then because of concern about coumadin use at that time.  . Orthostasis     treated  . Drug therapy     Intermittant steroids   . S/P IVC filter     2007  PE  . Warfarin anticoagulation   . Factor VIII     Factor VIII EXCESS.Marland KitchenMarland KitchenDr Hampton Abbot.Marland Kitchenanother reason for coumadin  . Hx of CABG     2008  . Incomplete RBBB   . Drug therapy     question rash diltiazem, and amio  . Ejection fraction     EF 60-65%, echo, March, 2012, moderate diastolic dysfunction  . 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 Sherene Sires; has chronic shortness of breath  . Atrial fibrillation     April, 2013, new diagnosis  . Bradycardia     Sinus bradycardia, asymptomatic, April, 2013  . Carotid artery disease     Dopplers to be checked, June, 2013  . Cough     October, 2013  . Tremor     October, 2013    Past Surgical History  Procedure Date  . Coronary artery bypass graft 4/08  . Cholecystectomy   . Back surgery     x2  . Cataract extraction     x2  . Colonoscopy w/ polypectomy     Patient Active Problem List  Diagnosis  . CROHN'S DISEASE  . Shortness of breath  . Syncope and collapse  . Hypertension  . Coronary artery disease  . Mitral regurgitation  . Pulmonary embolism  . Orthostasis  . Drug therapy  . S/P IVC filter  . Warfarin anticoagulation  . Factor VIII  . Hx of CABG  . Incomplete RBBB  . Drug therapy  . Ejection fraction  . Phrenic nerve palsy  . Bradycardia  . Hx of  amiodarone therapy  . Atrial fibrillation  . Carotid artery disease  . Cough  . Tremor    ROS   Patient denies fever, chills, headache, sweats, rash, change in vision, change in hearing, chest pain, cough, nausea vomiting, urinary symptoms. All other systems are reviewed and are negative.  PHYSICAL EXAM  Filed Vitals:   07/21/12 0906  BP: 132/80  Pulse: 68  Height: 5\' 10"  (1.778 m)  Weight: 209 lb (94.802 kg)   EKG is done today and reviewed by me. There is normal sinus rhythm.  ASSESSMENT & PLAN

## 2012-07-21 NOTE — Assessment & Plan Note (Addendum)
I am reducing his amiodarone from 200 mg daily to 150 mg daily ( alternating 200 mg with 100 mg).  We're being sure that TSH is okay. Recently while sitting and falling asleep in a chair he's had some coughing spells. He is not snoring at night. TSH will be checked to be absolutely sure there is no change in his thyroid.

## 2012-07-28 ENCOUNTER — Other Ambulatory Visit: Payer: Self-pay | Admitting: *Deleted

## 2012-07-28 DIAGNOSIS — I471 Supraventricular tachycardia: Secondary | ICD-10-CM

## 2012-07-28 MED ORDER — WARFARIN SODIUM 5 MG PO TABS: 5.0000 mg | ORAL_TABLET | ORAL | Status: DC

## 2012-07-29 ENCOUNTER — Ambulatory Visit (INDEPENDENT_AMBULATORY_CARE_PROVIDER_SITE_OTHER): Payer: Medicare Other

## 2012-07-29 DIAGNOSIS — I2699 Other pulmonary embolism without acute cor pulmonale: Secondary | ICD-10-CM

## 2012-07-29 DIAGNOSIS — Z7901 Long term (current) use of anticoagulants: Secondary | ICD-10-CM

## 2012-07-29 DIAGNOSIS — D66 Hereditary factor VIII deficiency: Secondary | ICD-10-CM

## 2012-08-21 ENCOUNTER — Encounter: Payer: Self-pay | Admitting: Cardiology

## 2012-09-03 ENCOUNTER — Encounter: Payer: Self-pay | Admitting: Cardiology

## 2012-09-03 NOTE — Progress Notes (Signed)
    I saw the patient's wife in the office today. She was concerned that [name] [name] had an increase in his blood pressure. He had had some problems with orthostatic changes and we have backed off on some of his medicines. I'm going to recommend that she check his blood pressure at random times over the next week and get this information to [place]. We can then decide if any medications are to be changed.

## 2012-09-14 ENCOUNTER — Telehealth: Payer: Self-pay

## 2012-09-14 ENCOUNTER — Ambulatory Visit (INDEPENDENT_AMBULATORY_CARE_PROVIDER_SITE_OTHER): Payer: Medicare Other | Admitting: Pharmacist

## 2012-09-14 DIAGNOSIS — Z7901 Long term (current) use of anticoagulants: Secondary | ICD-10-CM

## 2012-09-14 DIAGNOSIS — D66 Hereditary factor VIII deficiency: Secondary | ICD-10-CM

## 2012-09-14 DIAGNOSIS — I2699 Other pulmonary embolism without acute cor pulmonale: Secondary | ICD-10-CM

## 2012-09-14 NOTE — Telephone Encounter (Signed)
[name] [name] dropped off his bp readings.  They are as follows: 1/23  9:13pm  154/78   63 1/24  3:32pm  148/77   61 1/25  10:00am 151/82  64 1/26  11:45am 141/76  63 1/27  6:40pm   128/65  70 1/28  8:50am   147/73  61  1/29  2:10pm   149/82  66 1/30  2:45pm   144/73  67 1/31  1:00pm   170/81  69 2/1   3:10pm    148/78  61 2/2   1:45pm    162/79  61 2/3   8:10am    172/96  59 2/3   8:12am    176/89  58 (used a different bp monitor)

## 2012-09-18 NOTE — Telephone Encounter (Signed)
Have him increase his losartan up to 100 mg daily. He should then obtain some blood pressures over the next weeks and call the information into [place]

## 2012-09-18 NOTE — Telephone Encounter (Signed)
Pt was notified.  

## 2012-09-26 ENCOUNTER — Other Ambulatory Visit: Payer: Self-pay

## 2012-09-30 ENCOUNTER — Ambulatory Visit (INDEPENDENT_AMBULATORY_CARE_PROVIDER_SITE_OTHER): Payer: Medicare Other

## 2012-09-30 DIAGNOSIS — I2699 Other pulmonary embolism without acute cor pulmonale: Secondary | ICD-10-CM

## 2012-09-30 DIAGNOSIS — Z7901 Long term (current) use of anticoagulants: Secondary | ICD-10-CM

## 2012-09-30 DIAGNOSIS — D66 Hereditary factor VIII deficiency: Secondary | ICD-10-CM

## 2012-10-09 ENCOUNTER — Other Ambulatory Visit: Payer: Self-pay | Admitting: *Deleted

## 2012-10-09 ENCOUNTER — Telehealth: Payer: Self-pay | Admitting: Cardiology

## 2012-10-09 MED ORDER — LOSARTAN POTASSIUM 100 MG PO TABS: 100.0000 mg | ORAL_TABLET | Freq: Every day | ORAL | Status: DC

## 2012-10-09 NOTE — Telephone Encounter (Signed)
New Prob    Needs clarification on direction on Losartan 50 mg. Requesting new prescription. Would like to speak to nurse.

## 2012-10-09 NOTE — Telephone Encounter (Signed)
New rx sent in for Losartan 100mg  daily per Dr, Henrietta Hoover note.

## 2012-10-16 LAB — CBC
HCT: 43.8 % (ref 40.0–52.0)
MCHC: 34.7 g/dL (ref 32.0–36.0)
MCV: 93 fL (ref 80–100)
Platelet: 278 10*3/uL (ref 150–440)
RBC: 4.73 10*6/uL (ref 4.40–5.90)
RDW: 14.7 % — ABNORMAL HIGH (ref 11.5–14.5)
WBC: 6.3 10*3/uL (ref 3.8–10.6)

## 2012-10-16 LAB — COMPREHENSIVE METABOLIC PANEL
Albumin: 3.4 g/dL (ref 3.4–5.0)
Anion Gap: 11 (ref 7–16)
BUN: 16 mg/dL (ref 7–18)
Bilirubin,Total: 0.6 mg/dL (ref 0.2–1.0)
Calcium, Total: 8.6 mg/dL (ref 8.5–10.1)
Chloride: 90 mmol/L — ABNORMAL LOW (ref 98–107)
EGFR (African American): >60
EGFR (Non-African Amer.): >60
Glucose: 122 mg/dL — ABNORMAL HIGH (ref 65–99)
Osmolality: 252 (ref 275–301)
Potassium: 4 mmol/L (ref 3.5–5.1)
SGOT(AST): 45 U/L — ABNORMAL HIGH (ref 15–37)
Sodium: 124 mmol/L — ABNORMAL LOW (ref 136–145)
Total Protein: 7.1 g/dL (ref 6.4–8.2)

## 2012-10-17 ENCOUNTER — Inpatient Hospital Stay: Payer: Self-pay | Admitting: Internal Medicine

## 2012-10-17 DIAGNOSIS — I517 Cardiomegaly: Secondary | ICD-10-CM

## 2012-10-17 DIAGNOSIS — R55 Syncope and collapse: Secondary | ICD-10-CM

## 2012-10-17 LAB — CK TOTAL AND CKMB (NOT AT ARMC)
CK, Total: 107 U/L (ref 35–232)
CK, Total: 113 U/L (ref 35–232)
CK, Total: 108 U/L (ref 35–232)
CK-MB: 1.5 ng/mL (ref 0.5–3.6)
CK-MB: 1.9 ng/mL (ref 0.5–3.6)

## 2012-10-17 LAB — URINALYSIS, COMPLETE
Bilirubin,UR: NEGATIVE
Hyaline Cast: 1
Ph: 7 (ref 4.5–8.0)
Protein: NEGATIVE
RBC,UR: <1 /HPF (ref 0–5)
Specific Gravity: 1.011 (ref 1.003–1.030)
Squamous Epithelial: NONE SEEN

## 2012-10-17 LAB — TROPONIN I
Troponin-I: <0.02 ng/mL
Troponin-I: <0.02 ng/mL

## 2012-10-18 LAB — BASIC METABOLIC PANEL
Calcium, Total: 8.4 mg/dL — ABNORMAL LOW (ref 8.5–10.1)
Chloride: 95 mmol/L — ABNORMAL LOW (ref 98–107)
Co2: 23 mmol/L (ref 21–32)
Creatinine: 1.11 mg/dL (ref 0.60–1.30)
Glucose: 88 mg/dL (ref 65–99)
Osmolality: 257 (ref 275–301)
Sodium: 128 mmol/L — ABNORMAL LOW (ref 136–145)

## 2012-10-18 LAB — HEMOGLOBIN A1C: Hemoglobin A1C: 6.7 % — ABNORMAL HIGH (ref 4.2–6.3)

## 2012-10-18 LAB — MAGNESIUM: Magnesium: 1.6 mg/dL — ABNORMAL LOW

## 2012-10-19 DIAGNOSIS — R55 Syncope and collapse: Secondary | ICD-10-CM

## 2012-10-19 LAB — BASIC METABOLIC PANEL WITH GFR
Anion Gap: 3 — ABNORMAL LOW (ref 7–16)
BUN: 14 mg/dL (ref 7–18)
Calcium, Total: 8.5 mg/dL (ref 8.5–10.1)
Chloride: 97 mmol/L — ABNORMAL LOW (ref 98–107)
Co2: 28 mmol/L (ref 21–32)
Creatinine: 0.89 mg/dL (ref 0.60–1.30)
EGFR (African American): >60
EGFR (Non-African Amer.): >60
Glucose: 84 mg/dL (ref 65–99)
Osmolality: 257 (ref 275–301)
Potassium: 5 mmol/L (ref 3.5–5.1)
Sodium: 128 mmol/L — ABNORMAL LOW (ref 136–145)

## 2012-10-19 LAB — PROTIME-INR
INR: 2.9
Prothrombin Time: 29.7 secs — ABNORMAL HIGH (ref 11.5–14.7)

## 2012-10-20 DIAGNOSIS — R55 Syncope and collapse: Secondary | ICD-10-CM

## 2012-10-20 LAB — BASIC METABOLIC PANEL
BUN: 13 mg/dL (ref 7–18)
Calcium, Total: 8.5 mg/dL (ref 8.5–10.1)
Chloride: 97 mmol/L — ABNORMAL LOW (ref 98–107)
EGFR (Non-African Amer.): >60
Glucose: 81 mg/dL (ref 65–99)
Osmolality: 258 (ref 275–301)
Potassium: 4.5 mmol/L (ref 3.5–5.1)
Sodium: 129 mmol/L — ABNORMAL LOW (ref 136–145)

## 2012-10-20 LAB — PROTIME-INR: INR: 3.4

## 2012-10-21 ENCOUNTER — Ambulatory Visit (INDEPENDENT_AMBULATORY_CARE_PROVIDER_SITE_OTHER): Payer: Medicare Other

## 2012-10-21 DIAGNOSIS — Z7901 Long term (current) use of anticoagulants: Secondary | ICD-10-CM

## 2012-10-21 LAB — POCT INR: INR: 3.2

## 2012-10-28 ENCOUNTER — Encounter: Payer: Self-pay | Admitting: Cardiovascular Disease

## 2012-10-28 ENCOUNTER — Ambulatory Visit (INDEPENDENT_AMBULATORY_CARE_PROVIDER_SITE_OTHER): Payer: Medicare Other

## 2012-10-28 ENCOUNTER — Ambulatory Visit (INDEPENDENT_AMBULATORY_CARE_PROVIDER_SITE_OTHER): Payer: Medicare Other | Admitting: Cardiovascular Disease

## 2012-10-28 VITALS — BP 153/81 | HR 68 | Ht 70.0 in | Wt 212.8 lb

## 2012-10-28 DIAGNOSIS — R55 Syncope and collapse: Secondary | ICD-10-CM

## 2012-10-28 DIAGNOSIS — Z7901 Long term (current) use of anticoagulants: Secondary | ICD-10-CM

## 2012-10-28 DIAGNOSIS — I2699 Other pulmonary embolism without acute cor pulmonale: Secondary | ICD-10-CM

## 2012-10-28 DIAGNOSIS — D66 Hereditary factor VIII deficiency: Secondary | ICD-10-CM

## 2012-10-28 DIAGNOSIS — I4891 Unspecified atrial fibrillation: Secondary | ICD-10-CM

## 2012-10-28 NOTE — Assessment & Plan Note (Signed)
We have encouraged exercise daily, reading and dental stimulation. Uncertain if he is starting to have pulmonary signs of dementia. Recent events/syncope have been traumatic. Could be still recovering.

## 2012-10-28 NOTE — Assessment & Plan Note (Signed)
Maintaining normal sinus rhythm on today's visit. Continue amiodarone

## 2012-10-28 NOTE — Patient Instructions (Addendum)
You are doing well. No medication changes were made.  We will check you renal function and sodium   Call the office for more symptoms of lightheadedness  Please call us if you have new issues that need to be addressed before your next appt.  Your physician wants you to follow-up in: 3 months.  You will receive a reminder letter in the mail two months in advance. If you don't receive a letter, please call our office to schedule the follow-up appointment.

## 2012-10-28 NOTE — Assessment & Plan Note (Addendum)
Recent episode of syncope. Etiology not clear of the present with sodium 122, low magnesium, possible orthostasis. HCTZ was held in the hospital. I suggested to him that we've run his blood pressure mildly high to avoid further episodes. Continue fluid hydration, liberal salt intake. Avoid standing for prolonged period of time particularly in crowded and hot settings

## 2012-10-28 NOTE — Progress Notes (Signed)
Patient ID: [name] [name], male    DOB: [birth year], 72 y.o.   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  H/o atrial fibrillation on amiodarone, h/o  neurocardiogenic syncope,  MRI that showed no acute abnormality. some bradycardia.  on amiodarone, weaned off metoprolol  He has had episodes of syncope in the past felt to be neurocardiogenic.  Last episode of syncope several weeks ago he was at [employer] and is waiting in line for food. He had nausea, lightheadedness, then had syncope. He is on the ground for several minutes before he came to. Workup in the hospital showed sodium of 122, normal echocardiogram, normal telemetry with normal sinus rhythm. It was felt that he could be orthostatic from the start HCTZ earlier that week with a change in his sodium. Magnesium was also low. Myoview was performed that showed no ischemia. 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  EKG today shows normal sinus rhythm with rate 66 beats per minute with incomplete right bundle branch block, left anterior fascicular block     Outpatient Encounter Prescriptions as of 10/28/2012  Medication Sig Dispense Refill  . adalimumab (HUMIRA PEN) 40 MG/0.8ML injection Inject 40 mg into the skin.       Marland Kitchen ALPRAZolam (XANAX) 0.5 MG tablet as needed.      Marland Kitchen  amiodarone (PACERONE) 200 MG tablet Take 200mg  (1 tab) alternating with 100mg  (1/2 tab) every other day.  30 tablet  6  . aspirin 81 MG tablet Take 81 mg by mouth daily.        . Calcium Carbonate-Vit D-Min 600-400 MG-UNIT TABS Take 1 tablet by mouth daily.        . cholecalciferol (VITAMIN D) 1000 UNITS tablet Take 1,000 Units by mouth daily.        Marland Kitchen dimenhyDRINATE (DRAMAMINE) 50 MG tablet Take 50 mg by mouth every 8 (eight) hours as needed.        . fluticasone (FLONASE) 50 MCG/ACT nasal spray 2 sprays by Nasal route daily.        . folic acid (FOLVITE) 1 MG tablet Take 1 tablet by mouth daily.       Marland Kitchen loratadine (CLARITIN) 10 MG tablet 2 hrs before Humira       . losartan (COZAAR) 100 MG tablet Take 1 tablet (100 mg total) by mouth daily.  90 tablet  3  . Multiple Vitamin (MULTIVITAMIN) tablet Take 1 tablet by mouth daily.        . nitroGLYCERIN (NITROSTAT) 0.4 MG SL tablet Take 1 tablet (0.4 mg total) by mouth as directed.  25 tablet  6  . omeprazole (PRILOSEC) 40 MG capsule TAKE 1 CAPSULE BY MOUTH DAILY.  30 capsule  6  . predniSONE (DELTASONE) 20 MG tablet Take 20 mg by mouth as needed. 30mg , 30mg ,  two days prior to humira day of humira takes 20mg       .  ranitidine (ZANTAC) 150 MG tablet 2 hrs before Humira       . simvastatin (ZOCOR) 40 MG tablet Take 1 tablet by mouth daily.      Marland Kitchen warfarin (COUMADIN) 5 MG tablet Take 1 tablet (5 mg total) by mouth as directed.  40 tablet  3    Review of Systems  Constitutional: Negative.   HENT: Negative.   Eyes: Negative.   Respiratory: Negative.   Cardiovascular: Negative.   Gastrointestinal: Negative.   Musculoskeletal: Negative.   Skin: Negative.   Neurological: Negative.   Psychiatric/Behavioral: Negative.   All other systems reviewed and are negative.    BP 153/81  Pulse 68  Ht 5\' 10"  (1.778 m)  Wt 212 lb 12 oz (96.503 kg)  BMI 30.53 kg/m2  Physical Exam  Nursing note and vitals reviewed. Constitutional: He is oriented to  person, place, and time. He appears well-developed and well-nourished.  HENT:  Head: Normocephalic.  Nose: Nose normal.  Mouth/Throat: Oropharynx is clear and moist.  Eyes: Conjunctivae are normal. Pupils are equal, round, and reactive to light.  Neck: Normal range of motion. Neck supple. No JVD present.  Cardiovascular: Normal rate, regular rhythm, S1 normal, S2 normal, normal heart sounds and intact distal pulses.  Exam reveals no gallop and no friction rub.   No murmur heard. Pulmonary/Chest: Effort normal and breath sounds normal. No respiratory distress. He has no wheezes. He has no rales. He exhibits no tenderness.  Abdominal: Soft. Bowel sounds are normal. He exhibits no distension. There is no tenderness.  Musculoskeletal: Normal range of motion. He exhibits no edema and no tenderness.  Lymphadenopathy:    He has no cervical adenopathy.  Neurological: He is alert and oriented to person, place, and time. Coordination normal.  Skin: Skin is warm and dry. No rash noted. No erythema.  Psychiatric: Judgment and thought content normal. His affect is blunt. He is slowed.  Flat affect    Assessment and Plan

## 2012-10-29 LAB — BASIC METABOLIC PANEL
BUN/Creatinine Ratio: 14 (ref 10–22)
BUN: 15 mg/dL (ref 8–27)
CO2: 23 mmol/L (ref 19–28)
Calcium: 9.2 mg/dL (ref 8.6–10.2)
Creatinine, Ser: 1.1 mg/dL (ref 0.76–1.27)
GFR calc non Af Amer: 66 mL/min/{1.73_m2} (ref >59)

## 2012-11-11 ENCOUNTER — Ambulatory Visit (INDEPENDENT_AMBULATORY_CARE_PROVIDER_SITE_OTHER): Payer: Medicare Other

## 2012-11-11 DIAGNOSIS — D66 Hereditary factor VIII deficiency: Secondary | ICD-10-CM

## 2012-11-11 DIAGNOSIS — I2699 Other pulmonary embolism without acute cor pulmonale: Secondary | ICD-10-CM

## 2012-11-11 DIAGNOSIS — Z7901 Long term (current) use of anticoagulants: Secondary | ICD-10-CM

## 2012-12-02 ENCOUNTER — Ambulatory Visit (INDEPENDENT_AMBULATORY_CARE_PROVIDER_SITE_OTHER): Payer: Medicare Other

## 2012-12-02 DIAGNOSIS — I2699 Other pulmonary embolism without acute cor pulmonale: Secondary | ICD-10-CM

## 2012-12-02 DIAGNOSIS — Z7901 Long term (current) use of anticoagulants: Secondary | ICD-10-CM

## 2012-12-02 DIAGNOSIS — D66 Hereditary factor VIII deficiency: Secondary | ICD-10-CM

## 2012-12-30 ENCOUNTER — Ambulatory Visit (INDEPENDENT_AMBULATORY_CARE_PROVIDER_SITE_OTHER): Payer: Medicare Other

## 2012-12-30 DIAGNOSIS — I2699 Other pulmonary embolism without acute cor pulmonale: Secondary | ICD-10-CM

## 2012-12-30 DIAGNOSIS — D66 Hereditary factor VIII deficiency: Secondary | ICD-10-CM

## 2012-12-30 DIAGNOSIS — Z7901 Long term (current) use of anticoagulants: Secondary | ICD-10-CM

## 2013-01-08 ENCOUNTER — Encounter: Payer: Self-pay | Admitting: Neurology

## 2013-01-10 ENCOUNTER — Encounter: Payer: Self-pay | Admitting: Neurology

## 2013-01-26 ENCOUNTER — Other Ambulatory Visit: Payer: Self-pay | Admitting: Physician Assistant

## 2013-02-03 ENCOUNTER — Ambulatory Visit (INDEPENDENT_AMBULATORY_CARE_PROVIDER_SITE_OTHER): Payer: Medicare Other

## 2013-02-03 ENCOUNTER — Encounter: Payer: Self-pay | Admitting: Cardiovascular Disease

## 2013-02-03 ENCOUNTER — Ambulatory Visit (INDEPENDENT_AMBULATORY_CARE_PROVIDER_SITE_OTHER): Payer: Medicare Other | Admitting: Cardiovascular Disease

## 2013-02-03 ENCOUNTER — Encounter (INDEPENDENT_AMBULATORY_CARE_PROVIDER_SITE_OTHER): Payer: Medicare Other

## 2013-02-03 VITALS — BP 146/82 | HR 68 | Ht 70.0 in | Wt 213.2 lb

## 2013-02-03 DIAGNOSIS — Z951 Presence of aortocoronary bypass graft: Secondary | ICD-10-CM

## 2013-02-03 DIAGNOSIS — I6529 Occlusion and stenosis of unspecified carotid artery: Secondary | ICD-10-CM

## 2013-02-03 DIAGNOSIS — R55 Syncope and collapse: Secondary | ICD-10-CM

## 2013-02-03 DIAGNOSIS — D66 Hereditary factor VIII deficiency: Secondary | ICD-10-CM

## 2013-02-03 DIAGNOSIS — R0602 Shortness of breath: Secondary | ICD-10-CM

## 2013-02-03 DIAGNOSIS — I2699 Other pulmonary embolism without acute cor pulmonale: Secondary | ICD-10-CM

## 2013-02-03 DIAGNOSIS — Z7901 Long term (current) use of anticoagulants: Secondary | ICD-10-CM

## 2013-02-03 NOTE — Progress Notes (Signed)
Patient ID: [name] [name], male    DOB: June 15, 1941, 72 y.o.   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  H/o atrial fibrillation on amiodarone, h/o  neurocardiogenic syncope,  MRI that showed no acute abnormality. some bradycardia.  on amiodarone, weaned off metoprolol  He has had episodes of syncope in the past felt to be neurocardiogenic.  episode of syncope he was at a restaurant and is waiting in line for food. He had nausea, lightheadedness, then had syncope. He is on the ground for several minutes before he came to. Workup in the hospital showed sodium of 122, normal echocardiogram, normal telemetry with normal sinus rhythm. It was felt that he could be orthostatic from the start HCTZ earlier that week with a change in his sodium.    Myoview was performed that showed no ischemia. 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 He is active. Recently taking holes in the hot weather. He became short of breath, pale. He had to sit down. He did not pass out and did recover. This was approximately 6 weeks ago. No further episodes of near syncope or syncope. Feels he is doing better without the HCTZ.   He's been having trouble with his speech and was sent to speech therapy by neurology. Had him on MRI that by report showed atrophy Carotid ultrasound June 2013 showing mild carotid arterial disease  EKG today  shows normal sinus rhythm with rate 68 beats per minute with left anterior fascicular block     Outpatient Encounter Prescriptions as of 02/03/2013  Medication Sig Dispense Refill  . adalimumab (HUMIRA PEN) 40 MG/0.8ML injection Inject 40 mg into the skin.       Marland Kitchen ALPRAZolam (XANAX) 0.5 MG tablet as needed.      Marland Kitchen amiodarone (PACERONE) 200 MG tablet Take 200mg  (1 tab) alternating with 100mg  (1/2 tab) every other day.  30 tablet  6  . aspirin 81 MG tablet Take 81 mg by mouth daily.        . Calcium Carbonate-Vit D-Min 600-400 MG-UNIT TABS Take 1 tablet by mouth daily.        . cholecalciferol (VITAMIN D) 1000 UNITS tablet Take 1,000 Units by mouth daily.        Marland Kitchen dimenhyDRINATE (DRAMAMINE) 50 MG tablet Take 50 mg by mouth every 8 (eight) hours as needed.        . fluticasone (FLONASE) 50 MCG/ACT nasal spray 2 sprays by Nasal route daily.        . folic acid (FOLVITE) 1 MG tablet Take 1 tablet by mouth daily.       Marland Kitchen loratadine (CLARITIN) 10 MG tablet 2 hrs before Humira       . losartan (COZAAR) 100 MG tablet Take 1 tablet (100 mg total) by mouth daily.  90 tablet  3  . Multiple Vitamin (MULTIVITAMIN) tablet Take 1 tablet by mouth daily.        . nitroGLYCERIN (NITROSTAT) 0.4 MG SL  tablet Take 1 tablet (0.4 mg total) by mouth as directed.  25 tablet  6  . omeprazole (PRILOSEC) 40 MG capsule TAKE 1 CAPSULE BY MOUTH DAILY.  30 capsule  1  . predniSONE (DELTASONE) 20 MG tablet Take 20 mg by mouth as needed. 30mg , 30mg ,  two days prior to humira day of humira takes 20mg       . ranitidine (ZANTAC) 150 MG tablet 2 hrs before Humira       . simvastatin (ZOCOR) 40 MG tablet Take 1 tablet by mouth daily.      Marland Kitchen warfarin (COUMADIN) 5 MG tablet Take 1 tablet (5 mg total) by mouth as directed.  40 tablet  3    Review of Systems  Constitutional: Negative.   HENT: Negative.   Eyes: Negative.   Respiratory: Negative.   Cardiovascular: Negative.   Gastrointestinal: Negative.   Musculoskeletal:  Negative.   Skin: Negative.   Neurological: Negative.   Psychiatric/Behavioral:       Decreased memory, delay in his speech.  All other systems reviewed and are negative.    BP 146/82  Pulse 68  Ht 5\' 10"  (1.778 m)  Wt 213 lb 4 oz (96.73 kg)  BMI 30.6 kg/m2  Physical Exam  Nursing note and vitals reviewed. Constitutional: He is oriented to person, place, and time. He appears well-developed and well-nourished.  HENT:  Head: Normocephalic.  Nose: Nose normal.  Mouth/Throat: Oropharynx is clear and moist.  Eyes: Conjunctivae are normal. Pupils are equal, round, and reactive to light.  Neck: Normal range of motion. Neck supple. No JVD present.  Cardiovascular: Normal rate, regular rhythm, S1 normal, S2 normal, normal heart sounds and intact distal pulses.  Exam reveals no gallop and no friction rub.   No murmur heard. Pulmonary/Chest: Effort normal and breath sounds normal. No respiratory distress. He has no wheezes. He has no rales. He exhibits no tenderness.  Abdominal: Soft. Bowel sounds are normal. He exhibits no distension. There is no tenderness.  Musculoskeletal: Normal range of motion. He exhibits no edema and no tenderness.  Lymphadenopathy:    He has no cervical adenopathy.  Neurological: He is alert and oriented to person, place, and time. Coordination normal.  Skin: Skin is warm and dry. No rash noted. No erythema.  Psychiatric: Judgment and thought content normal. His affect is blunt. He is slowed.  Flat affect    Assessment and Plan

## 2013-02-03 NOTE — Patient Instructions (Addendum)
You are doing well. No medication changes were made.  Please call us if you have new issues that need to be addressed before your next appt.  Your physician wants you to follow-up in: 6 months.  You will receive a reminder letter in the mail two months in advance. If you don't receive a letter, please call our office to schedule the follow-up appointment.   

## 2013-02-03 NOTE — Assessment & Plan Note (Signed)
No further significant episodes. One recent event of lightheadedness after overexerting himself. He seems to be doing well without HCTZ. Encouraged him to continue aggressive hydration. Minimize working in the heat.

## 2013-02-03 NOTE — Assessment & Plan Note (Signed)
Currently with no symptoms of angina. No further workup at this time. Continue current medication regimen. 

## 2013-02-03 NOTE — Assessment & Plan Note (Signed)
Continues to have chronic mild shortness breath with exertion. Would attribute this to deconditioning.

## 2013-02-15 ENCOUNTER — Encounter: Payer: Self-pay | Admitting: *Deleted

## 2013-02-18 ENCOUNTER — Telehealth: Payer: Self-pay | Admitting: *Deleted

## 2013-02-18 NOTE — Telephone Encounter (Signed)
Patient was diagnosed with beginning stage of alzheimer's and was put on Aricept..... Dr. Sullivan Lone his PCP suggest taking Vayacog. Patient's wife not sure what to do...please advise

## 2013-02-18 NOTE — Telephone Encounter (Signed)
Ok to take these medications

## 2013-02-21 ENCOUNTER — Encounter: Payer: Self-pay | Admitting: Cardiology

## 2013-02-21 NOTE — Telephone Encounter (Signed)
In general these meds should be ok Have not heard of Vayacog

## 2013-02-22 NOTE — Telephone Encounter (Signed)
Forwarding to Erika.

## 2013-02-23 NOTE — Telephone Encounter (Signed)
Called spoke with pt, advised per Dr Mariah Milling medications rx by primary MD for dementia should be ok to take. Pt verbalized understanding and relayed info to his wife.

## 2013-03-03 ENCOUNTER — Ambulatory Visit (INDEPENDENT_AMBULATORY_CARE_PROVIDER_SITE_OTHER): Payer: Medicare Other

## 2013-03-03 DIAGNOSIS — Z7901 Long term (current) use of anticoagulants: Secondary | ICD-10-CM

## 2013-03-03 DIAGNOSIS — I2699 Other pulmonary embolism without acute cor pulmonale: Secondary | ICD-10-CM

## 2013-03-03 DIAGNOSIS — D66 Hereditary factor VIII deficiency: Secondary | ICD-10-CM

## 2013-03-12 HISTORY — PX: OTHER SURGICAL HISTORY: SHX169

## 2013-03-12 HISTORY — PX: INSERT / REPLACE / REMOVE PACEMAKER: SUR710

## 2013-03-12 HISTORY — PX: CARDIAC CATHETERIZATION: SHX172

## 2013-03-17 ENCOUNTER — Other Ambulatory Visit: Payer: Self-pay

## 2013-03-20 ENCOUNTER — Observation Stay: Payer: Self-pay | Admitting: Internal Medicine

## 2013-03-20 DIAGNOSIS — R079 Chest pain, unspecified: Secondary | ICD-10-CM

## 2013-03-20 DIAGNOSIS — R55 Syncope and collapse: Secondary | ICD-10-CM

## 2013-03-20 LAB — CBC
HCT: 42.9 % (ref 40.0–52.0)
HGB: 14.7 g/dL (ref 13.0–18.0)
MCH: 31.9 pg (ref 26.0–34.0)
MCHC: 34.3 g/dL (ref 32.0–36.0)
MCV: 93 fL (ref 80–100)
Platelet: 245 10*3/uL (ref 150–440)

## 2013-03-20 LAB — TROPONIN I: Troponin-I: <0.02 ng/mL

## 2013-03-20 LAB — BASIC METABOLIC PANEL
Anion Gap: 6 — ABNORMAL LOW (ref 7–16)
Calcium, Total: 8.6 mg/dL (ref 8.5–10.1)
Chloride: 100 mmol/L (ref 98–107)
Co2: 25 mmol/L (ref 21–32)
Creatinine: 1.12 mg/dL (ref 0.60–1.30)
EGFR (African American): >60
EGFR (Non-African Amer.): >60
Potassium: 4.7 mmol/L (ref 3.5–5.1)

## 2013-03-20 LAB — APTT: Activated PTT: 44.6 secs — ABNORMAL HIGH (ref 23.6–35.9)

## 2013-03-20 LAB — PROTIME-INR
INR: 2.6
Prothrombin Time: 27.3 secs — ABNORMAL HIGH (ref 11.5–14.7)

## 2013-03-20 LAB — CK TOTAL AND CKMB (NOT AT ARMC)
CK, Total: 63 U/L (ref 35–232)
CK, Total: 60 U/L (ref 35–232)
CK-MB: 1.7 ng/mL (ref 0.5–3.6)
CK-MB: 1.7 ng/mL (ref 0.5–3.6)

## 2013-03-21 LAB — CBC WITH DIFFERENTIAL/PLATELET
Basophil #: 0 10*3/uL (ref 0.0–0.1)
Basophil %: 0.4 %
Eosinophil #: 0.5 10*3/uL (ref 0.0–0.7)
HCT: 44.8 % (ref 40.0–52.0)
HGB: 15.5 g/dL (ref 13.0–18.0)
Lymphocyte #: 1.9 10*3/uL (ref 1.0–3.6)
Lymphocyte %: 30 %
MCV: 94 fL (ref 80–100)
Monocyte #: 1 x10 3/mm (ref 0.2–1.0)
Monocyte %: 16.2 %
Neutrophil #: 2.9 10*3/uL (ref 1.4–6.5)
Neutrophil %: 46.3 %
Platelet: 247 10*3/uL (ref 150–440)
RDW: 15.3 % — ABNORMAL HIGH (ref 11.5–14.5)

## 2013-03-21 LAB — BASIC METABOLIC PANEL
Anion Gap: 8 (ref 7–16)
BUN: 15 mg/dL (ref 7–18)
EGFR (African American): >60
Osmolality: 265 (ref 275–301)
Sodium: 132 mmol/L — ABNORMAL LOW (ref 136–145)

## 2013-03-21 LAB — LIPID PANEL
Cholesterol: 121 mg/dL (ref 0–200)
Ldl Cholesterol, Calc: 59 mg/dL (ref 0–100)
VLDL Cholesterol, Calc: 13 mg/dL (ref 5–40)

## 2013-03-21 LAB — PROTIME-INR: Prothrombin Time: 26.3 secs — ABNORMAL HIGH (ref 11.5–14.7)

## 2013-03-22 LAB — PROTIME-INR: Prothrombin Time: 21.5 secs — ABNORMAL HIGH (ref 11.5–14.7)

## 2013-03-23 ENCOUNTER — Emergency Department: Payer: Self-pay | Admitting: Emergency Medicine

## 2013-03-23 DIAGNOSIS — I2510 Atherosclerotic heart disease of native coronary artery without angina pectoris: Secondary | ICD-10-CM

## 2013-03-23 LAB — URINALYSIS, COMPLETE
Blood: NEGATIVE
Ketone: NEGATIVE
Leukocyte Esterase: NEGATIVE
Nitrite: NEGATIVE
Protein: NEGATIVE
RBC,UR: <1 /HPF (ref 0–5)
Specific Gravity: 1.029 (ref 1.003–1.030)
Squamous Epithelial: NONE SEEN
WBC UR: 1 /HPF (ref 0–5)

## 2013-03-23 LAB — COMPREHENSIVE METABOLIC PANEL
Alkaline Phosphatase: 173 U/L — ABNORMAL HIGH (ref 50–136)
Anion Gap: 6 — ABNORMAL LOW (ref 7–16)
BUN: 12 mg/dL (ref 7–18)
Bilirubin,Total: 0.5 mg/dL (ref 0.2–1.0)
Calcium, Total: 7.8 mg/dL — ABNORMAL LOW (ref 8.5–10.1)
Co2: 23 mmol/L (ref 21–32)
EGFR (African American): >60
EGFR (Non-African Amer.): 55 — ABNORMAL LOW
Glucose: 107 mg/dL — ABNORMAL HIGH (ref 65–99)
Potassium: 4.4 mmol/L (ref 3.5–5.1)
SGOT(AST): 153 U/L — ABNORMAL HIGH (ref 15–37)
SGPT (ALT): 219 U/L — ABNORMAL HIGH (ref 12–78)
Sodium: 131 mmol/L — ABNORMAL LOW (ref 136–145)
Total Protein: 6.2 g/dL — ABNORMAL LOW (ref 6.4–8.2)

## 2013-03-23 LAB — TROPONIN I: Troponin-I: <0.02 ng/mL

## 2013-03-23 LAB — CBC
HCT: 42.9 % (ref 40.0–52.0)
HGB: 14.8 g/dL (ref 13.0–18.0)
MCH: 32 pg (ref 26.0–34.0)
MCHC: 34.4 g/dL (ref 32.0–36.0)
MCV: 93 fL (ref 80–100)
Platelet: 218 10*3/uL (ref 150–440)
WBC: 6.1 10*3/uL (ref 3.8–10.6)

## 2013-03-23 LAB — CK TOTAL AND CKMB (NOT AT ARMC): CK, Total: 49 U/L (ref 35–232)

## 2013-03-23 LAB — PROTIME-INR: INR: 1.7

## 2013-03-24 ENCOUNTER — Encounter (HOSPITAL_COMMUNITY): Payer: Self-pay

## 2013-03-24 ENCOUNTER — Observation Stay (HOSPITAL_COMMUNITY)
Admission: AD | Admit: 2013-03-24 | Discharge: 2013-03-26 | Disposition: A | Discharge status: Home / Self Care | Payer: Medicare Other | Source: Other Acute Inpatient Hospital | Attending: Internal Medicine | Admitting: Internal Medicine

## 2013-03-24 ENCOUNTER — Encounter (HOSPITAL_COMMUNITY)
Admission: AD | Disposition: A | Discharge status: Home / Self Care | Payer: Self-pay | Source: Other Acute Inpatient Hospital | Attending: Internal Medicine

## 2013-03-24 DIAGNOSIS — F0280 Dementia in other diseases classified elsewhere without behavioral disturbance: Secondary | ICD-10-CM | POA: Insufficient documentation

## 2013-03-24 DIAGNOSIS — I4891 Unspecified atrial fibrillation: Secondary | ICD-10-CM | POA: Insufficient documentation

## 2013-03-24 DIAGNOSIS — Z7901 Long term (current) use of anticoagulants: Secondary | ICD-10-CM | POA: Insufficient documentation

## 2013-03-24 DIAGNOSIS — G309 Alzheimer's disease, unspecified: Secondary | ICD-10-CM | POA: Insufficient documentation

## 2013-03-24 DIAGNOSIS — I2510 Atherosclerotic heart disease of native coronary artery without angina pectoris: Secondary | ICD-10-CM | POA: Insufficient documentation

## 2013-03-24 DIAGNOSIS — R55 Syncope and collapse: Principal | ICD-10-CM | POA: Insufficient documentation

## 2013-03-24 DIAGNOSIS — I10 Essential (primary) hypertension: Secondary | ICD-10-CM | POA: Insufficient documentation

## 2013-03-24 DIAGNOSIS — R7401 Elevation of levels of liver transaminase levels: Secondary | ICD-10-CM | POA: Insufficient documentation

## 2013-03-24 DIAGNOSIS — R7402 Elevation of levels of lactic acid dehydrogenase (LDH): Secondary | ICD-10-CM | POA: Insufficient documentation

## 2013-03-24 HISTORY — PX: LOOP RECORDER IMPLANT: SHX5477

## 2013-03-24 LAB — CBC WITH DIFFERENTIAL/PLATELET
Basophils Relative: 1 % (ref 0–1)
Eosinophils Absolute: 0.2 10*3/uL (ref 0.0–0.7)
HCT: 41 % (ref 39.0–52.0)
Hemoglobin: 14.6 g/dL (ref 13.0–17.0)
Lymphs Abs: 1.6 10*3/uL (ref 0.7–4.0)
MCH: 31.8 pg (ref 26.0–34.0)
MCHC: 35.6 g/dL (ref 30.0–36.0)
Monocytes Absolute: 1.1 10*3/uL — ABNORMAL HIGH (ref 0.1–1.0)
Monocytes Relative: 22 % — ABNORMAL HIGH (ref 3–12)
Neutrophils Relative %: 40 % — ABNORMAL LOW (ref 43–77)
RBC: 4.59 MIL/uL (ref 4.22–5.81)

## 2013-03-24 LAB — COMPREHENSIVE METABOLIC PANEL
ALT: 162 U/L — ABNORMAL HIGH (ref 0–53)
AST: 98 U/L — ABNORMAL HIGH (ref 0–37)
Albumin: 3 g/dL — ABNORMAL LOW (ref 3.5–5.2)
Calcium: 8.5 mg/dL (ref 8.4–10.5)
Creatinine, Ser: 1.05 mg/dL (ref 0.50–1.35)
GFR calc non Af Amer: 68 mL/min — ABNORMAL LOW (ref >90)
Sodium: 133 mEq/L — ABNORMAL LOW (ref 135–145)
Total Protein: 5.7 g/dL — ABNORMAL LOW (ref 6.0–8.3)

## 2013-03-24 LAB — APTT: aPTT: 39 seconds — ABNORMAL HIGH (ref 24–37)

## 2013-03-24 LAB — PROTIME-INR: INR: 2 — ABNORMAL HIGH (ref 0.00–1.49)

## 2013-03-24 SURGERY — LOOP RECORDER IMPLANT
Anesthesia: LOCAL

## 2013-03-24 MED ORDER — NITROGLYCERIN 0.4 MG SL SUBL: 0.4000 mg | SUBLINGUAL_TABLET | SUBLINGUAL | Status: DC | PRN

## 2013-03-24 MED ORDER — WARFARIN SODIUM 2.5 MG PO TABS: 2.5000 mg | ORAL_TABLET | Freq: Every day | ORAL | Status: DC

## 2013-03-24 MED ORDER — WARFARIN SODIUM 2.5 MG PO TABS
2.5000 mg | ORAL_TABLET | ORAL | Status: DC
  Administered 2013-03-24 – 2013-03-25 (×2): 2.5 mg via ORAL
  Filled 2013-03-24 (×2): qty 1

## 2013-03-24 MED ORDER — DONEPEZIL HCL 10 MG PO TABS
10.0000 mg | ORAL_TABLET | Freq: Every day | ORAL | Status: DC
  Administered 2013-03-24 – 2013-03-26 (×3): 10 mg via ORAL
  Filled 2013-03-24 (×3): qty 1

## 2013-03-24 MED ORDER — ASPIRIN 81 MG PO CHEW
81.0000 mg | CHEWABLE_TABLET | Freq: Every day | ORAL | Status: DC
  Administered 2013-03-24 – 2013-03-26 (×3): 81 mg via ORAL
  Filled 2013-03-24 (×4): qty 1

## 2013-03-24 MED ORDER — PANTOPRAZOLE SODIUM 40 MG PO TBEC
80.0000 mg | DELAYED_RELEASE_TABLET | Freq: Every day | ORAL | Status: DC
  Administered 2013-03-24 – 2013-03-26 (×3): 80 mg via ORAL
  Filled 2013-03-24 (×3): qty 2

## 2013-03-24 MED ORDER — SODIUM CHLORIDE 0.9 % IJ SOLN
3.0000 mL | Freq: Two times a day (BID) | INTRAMUSCULAR | Status: DC
  Administered 2013-03-24: 3 mL via INTRAVENOUS

## 2013-03-24 MED ORDER — ALPRAZOLAM 0.5 MG PO TABS: 0.5000 mg | ORAL_TABLET | Freq: Every evening | ORAL | Status: DC | PRN

## 2013-03-24 MED ORDER — AMIODARONE HCL 200 MG PO TABS
200.0000 mg | ORAL_TABLET | Freq: Every day | ORAL | Status: DC
  Administered 2013-03-24 – 2013-03-26 (×3): 200 mg via ORAL
  Filled 2013-03-24 (×3): qty 1

## 2013-03-24 MED ORDER — WARFARIN - PHARMACIST DOSING INPATIENT
Freq: Every day | Status: DC
  Administered 2013-03-25: 20:00:00

## 2013-03-24 MED ORDER — SODIUM CHLORIDE 0.9 % IJ SOLN
3.0000 mL | Freq: Two times a day (BID) | INTRAMUSCULAR | Status: DC
  Administered 2013-03-24 – 2013-03-26 (×4): 3 mL via INTRAVENOUS

## 2013-03-24 MED ORDER — CEFAZOLIN SODIUM-DEXTROSE 2-3 GM-% IV SOLR: 2.0000 g | INTRAVENOUS | Status: DC

## 2013-03-24 MED ORDER — SODIUM CHLORIDE 0.9 % IJ SOLN: 3.0000 mL | INTRAMUSCULAR | Status: DC | PRN

## 2013-03-24 MED ORDER — LOSARTAN POTASSIUM 50 MG PO TABS
100.0000 mg | ORAL_TABLET | Freq: Every day | ORAL | Status: DC
  Administered 2013-03-24 – 2013-03-26 (×3): 100 mg via ORAL
  Filled 2013-03-24 (×3): qty 2

## 2013-03-24 MED ORDER — FLUTICASONE PROPIONATE 50 MCG/ACT NA SUSP
2.0000 | Freq: Every day | NASAL | Status: DC
  Administered 2013-03-25 – 2013-03-26 (×2): 2 via NASAL
  Filled 2013-03-24: qty 16

## 2013-03-24 MED ORDER — SIMVASTATIN 40 MG PO TABS
40.0000 mg | ORAL_TABLET | Freq: Every day | ORAL | Status: DC
  Administered 2013-03-25 – 2013-03-26 (×2): 40 mg via ORAL
  Filled 2013-03-24 (×4): qty 1

## 2013-03-24 MED ORDER — FOLIC ACID 1 MG PO TABS
1.0000 mg | ORAL_TABLET | Freq: Every day | ORAL | Status: DC
Start: 2013-03-24 — End: 2013-03-26
  Administered 2013-03-24 – 2013-03-26 (×3): 1 mg via ORAL
  Filled 2013-03-24 (×3): qty 1

## 2013-03-24 MED ORDER — WARFARIN SODIUM 5 MG PO TABS
5.0000 mg | ORAL_TABLET | ORAL | Status: DC
  Filled 2013-03-24: qty 1

## 2013-03-24 MED ORDER — LIDOCAINE-EPINEPHRINE 1 %-1:100000 IJ SOLN
INTRAMUSCULAR | Status: AC
  Filled 2013-03-24: qty 1

## 2013-03-24 MED ORDER — SODIUM CHLORIDE 0.9 % IV SOLN: 250.0000 mL | INTRAVENOUS | Status: DC | PRN

## 2013-03-24 MED ORDER — CHLORHEXIDINE GLUCONATE 4 % EX LIQD: 60.0000 mL | Freq: Once | CUTANEOUS | Status: DC

## 2013-03-24 NOTE — CV Procedure (Signed)
Pre op Dx recurrent syncope  Post op Dx same  Procedure  Loop Recorder implantation  After routine prep and drape of the left parasternal area, a small incision was created. A [employer] LINQ Reveal Loop Recorder  Serial Number  [id number]  was inserted.    Steri-Strips were applied.  The patient tolerated the procedure without apparent complication.

## 2013-03-24 NOTE — Progress Notes (Signed)
ELECTROPHYSIOLOGY CONSULT NOTE  Patient ID: [name] [name] MRN: [id number], DOB/AGE: February 05, 1941   Admit date: 03/24/2013 Date of Consult: 03/24/2013  Primary Physician: Bosie Clos, MD Primary Cardiologist: Mariah Milling, MD Reason for Consultation: Syncope  History of Present Illness [name] [name] is a 72 y.o. male with CAD s/p CABG, PSVT, PAF, OSA, factor VIII deficiency, Crohn's disease and HTN who has been admitted with recurrent syncope. His wife and family are at the bedside and assist with history questions. [name] [name] has had 10-12 episodes in the last 6 months. His family states that 2 of them were so severe and resulted in head injuries. [name] [name] states his episodes have mostly occurred while standing, a few while seated, none while lying down. They have all been the same with varying degrees of severity. He states they all occur with some warning. He describes prodrome of weakness, dizziness and diaphoresis. He denies nausea or vomiting. He denies CP or SOB. He denies palpitations. His wife and family report he is extremely pale. He has worn 2 event monitors in the past without any recurrent syncope while wearing them. He has now had 2 episodes within 3 days of one another prompting his admission. He was evaluated at [facility] over the weekend. He had a cardiac cath done which revealed stable CAD, 5/5 grafts patent and normal LV systolic function, EF 50%. Of note, [name] [name] wife and family are requesting a Neurology consultation while hospitalized. They state he has been diagnosed with "early Alzheimer's dementia and swelling on the right side of the brain."   Past Medical History Past Medical History  Diagnosis Date  . Syncope and collapse     Evaluation by Dr Turner Daniels; Syncope probably neutrally mediated and modest resting sinus bradycardia. and 16 B. episode of SVT that was either A fib or another type of SVT. Patient improved with combination of holding ACE inhibitor or low BP and reducing beta  blocker for bradycardia  . Hypertension                                                                                                                                                                                                                                                                                                                                                                                                                       .  Coronary artery disease     relook cath 08/12/08 - all 5 grafts are patent  . Crohn's disease     Humara Rx in past  . SVT (supraventricular tachycardia)   . 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 History of benign colon tumor   . Sleep apnea     questionable  . Mitral regurgitation     mild - echo 4/09  . Anxiety   . Gait difficulty     with Crestor (but tolerates simva)  . Pulmonary embolism     2007,b CT scan, IVC fiter placed then because of concern about coumadin use at that time.  . Orthostasis     treated  . Drug therapy     Intermittant steroids   . S/P IVC filter     2007  PE  . Warfarin anticoagulation   . Factor VIII     Factor VIII EXCESS.Marland KitchenMarland KitchenDr Hampton Abbot.Marland Kitchenanother reason for coumadin  . Hx of CABG     2008  . Incomplete RBBB   . Drug therapy     question rash diltiazem, and amio  . Ejection fraction     EF 60-65%, echo, March, 2012, moderate diastolic dysfunction  . 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 Sherene Sires; has chronic shortness of breath  . Atrial fibrillation     April, 2013, new diagnosis  . Bradycardia     Sinus bradycardia, asymptomatic, April, 2013  . Carotid artery disease     Dopplers to be checked, June, 2013  . Cough     October, 2013  . Tremor     October, 2013    Past Surgical History Past Surgical History  Procedure Laterality Date  . Coronary artery bypass graft  4/08  . Cholecystectomy    . Back surgery      x2  . Cataract extraction      x2  . Colonoscopy w/ polypectomy       Allergies/Intolerances Allergies  Allergen Reactions  . Codeine   . Diltiazem Hcl   . Doxycycline   . Infliximab   . Mercaptopurine   . Mesalamine   . Methotrexate   . Metronidazole   . Pyridostigmine Bromide    Inpatient Medications . amiodarone  200 mg Oral Daily  . aspirin  81 mg Oral Daily  . donepezil  10 mg Oral Daily  . fluticasone  2 spray Each Nare Daily  . folic acid  1 mg Oral Daily  . losartan  100 mg Oral Daily  . pantoprazole  80 mg Oral Daily  . simvastatin  40 mg Oral Daily  . sodium chloride  3 mL Intravenous Q12H  . sodium chloride  3 mL Intravenous Q12H   Family History Family History  Problem Relation Age of Onset  . Coronary artery disease      Social History Social History  . Marital Status: Married   Occupational History  . retired    Social History Main Topics  . Smoking status: Former Smoker    Types: Cigarettes    Quit date: 08/12/1961  . Smokeless tobacco: Never Used     Comment: smoked occ in his teenage yrs  . Alcohol Use: No  . Drug Use: No   Review of Systems General: No chills, fever, night sweats or weight changes  Cardiovascular:  No chest pain, dyspnea on exertion, edema, orthopnea, palpitations, paroxysmal nocturnal dyspnea Dermatological: No rash, lesions or masses Respiratory: No cough, dyspnea Urologic:  No hematuria, dysuria Abdominal: No nausea, vomiting, diarrhea, bright red blood per rectum, melena, or hematemesis Neurologic: No visual changes All other systems reviewed and are otherwise negative except as noted above.  Physical Exam Vitals: Blood pressure 169/95, pulse 60, temperature 98.3 F (36.8 C), temperature source Oral, resp. rate 18, height 5\' 10"  (1.778 m), weight 214 lb 1.6 oz (97.115 kg), SpO2 96.00%.  General: Well developed, well appearing 72 y.o. male in no acute distress. HEENT: Normocephalic, atraumatic. EOMs intact. Sclera nonicteric. Oropharynx clear.  Neck: Supple. No JVD. Lungs: Respirations  regular and unlabored, CTA bilaterally. No wheezes, rales or rhonchi. Heart: RRR. S1, S2 present. No murmurs, rub, S3 or S4. Abdomen: Soft, non-tender, non-distended. BS present x 4 quadrants. No hepatosplenomegaly.  Extremities: No clubbing, cyanosis or edema. DP/PT/Radials 2+ and equal bilaterally. Psych: Normal affect. Neuro: Alert and oriented X 3. Moves all extremities spontaneously. Musculoskeletal: No kyphosis. Skin: Intact. Warm and dry. No rashes or petechiae in exposed areas.   Labs Lab Results  Component Value Date   WBC 4.9 03/24/2013   HGB 14.6 03/24/2013   HCT 41.0 03/24/2013   MCV 89.3 03/24/2013   PLT 224 03/24/2013    Recent Labs Lab 03/24/13 0530  NA 133*  K 4.4  CL 102  CO2 22  BUN 11  CREATININE 1.05  CALCIUM 8.5  PROT 5.7*  BILITOT 0.3  ALKPHOS 157*  ALT 162*  AST 98*  GLUCOSE 94    Recent Labs  03/24/13 0530  INR 2.00*    Radiology/Studies No results found.  Echocardiogram at [facility] March 2014 Mild concentric LVH, LVEF 50-55%, grade I diastolic dysfunction, normal RV function, mild LAE, no significant valvular abnormalities  12-lead ECG yesterday from [facility] shows sinus bradycardia at 59 bpm with incomplete RBBB Telemetry shows SR; no arrhythmias   Assessment and Plan 1. Recurrent syncope - seems most consistent with vasovagal / neurally mediated syncope - scheduled for ILR insertion today to rule out cardiac arrhythmia - per family request will ask for Neurology consultation 2. CAD s/p CABG - cardiac cath yesterday at [facility] unremarkable - 5/5 grafts patent, EF 50%  - continue medical therapy 3. PAF 4. History of PE - s/p IVC filter placement; also on warfarin 5. History of SVT  Dr. Graciela Husbands to see. Please see recommendations below. Signed, Rick Duff, PA-C 03/24/2013, 8:22 AM   Patient with a long standing history of syncope dating back 8-10 years. It became quiescent following bypass surgery 2008. Over the last 3-4 years there  have been recurrences. 8-12 overall, with 3 in the last week or 2. They are stereotypical in their prodrome characterized by dizziness flushing nausea and some diaphoresis They can sometimes be aborted by sitting or lying down. Residual orthostatic intolerance. Blood pressures have been recorded on numerous occasions with recordings in the 90s which is far below his normal 130-170. He is diaphoretic and washed out afterwards.  There is a great deal of  Frustration  given the escalating frequency.  The patient has seen a neurologist in the last month or 2 at [place] with a diagnosis of early dementia. Aricept was initiated.  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 He might however have a neurally mediated syncope triggered by his known tachycardia arrhythmia. Treatment strategies would be significantly different and so I think loop recorder insertion to identify potential treatable triggers makes sense. He'll also need attention to orthostatic intolerance of which he has a number of symptoms.

## 2013-03-24 NOTE — Progress Notes (Addendum)
ANTICOAGULATION CONSULT NOTE - Initial Consult  Pharmacy Consult for Coumadin Indication: h/o Afib  Allergies  Allergen Reactions  . Codeine   . Diltiazem Hcl   . Doxycycline   . Infliximab   . Mercaptopurine   . Mesalamine   . Methotrexate   . Metronidazole   . Pyridostigmine Bromide     Patient Measurements: Height: 5\' 10"  (177.8 cm) Weight: 214 lb 1.6 oz (97.115 kg) IBW/kg (Calculated) : 73  Vital Signs: Temp: 98.3 F (36.8 C) (08/13 0314) Temp src: Oral (08/13 0314) BP: 169/95 mmHg (08/13 0314) Pulse Rate: 60 (08/13 0314)  Labs (at [facility]): WBC  6.1 Hgb 14.8 Hct 42.9 Plt 218  INR 1.7 (8/12 0630)  No results found for this basename: HGB, HCT, PLT, APTT, LABPROT, INR, HEPARINUNFRC, CREATININE, CKTOTAL, CKMB, TROPONINI,  in the last 72 hours  Estimated Creatinine Clearance: 69.9 ml/min (by C-G formula based on Cr of 1.1).   Medical History: Past Medical History  Diagnosis Date  . Syncope and collapse     Evaluation by Dr Turner Daniels; Syncope probably neutrally mediated and modest resting sinus bradycardia. and 16 B. episode of SVT that was either A fib or another type of SVT. Patient improved with combination of holding ACE inhibitor or low BP and reducing beta blocker for bradycardia  . Hypertension                                                                                                                                                                                                                                                                                                                                                                                                                       .  Coronary artery disease     relook cath 08/12/08 - all 5 grafts are patent  . Crohn's disease     Humara Rx in past  . SVT (supraventricular tachycardia)   . 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 History of benign colon tumor   . Sleep apnea     questionable  . Mitral  regurgitation     mild - echo 4/09  . Anxiety   . Gait difficulty     with Crestor (but tolerates simva)  . Pulmonary embolism     2007,b CT scan, IVC fiter placed then because of concern about coumadin use at that time.  . Orthostasis     treated  . Drug therapy     Intermittant steroids   . S/P IVC filter     2007  PE  . Warfarin anticoagulation   . Factor VIII     Factor VIII EXCESS.Marland KitchenMarland KitchenDr Hampton Abbot.Marland Kitchenanother reason for coumadin  . Hx of CABG     2008  . Incomplete RBBB   . Drug therapy     question rash diltiazem, and amio  . Ejection fraction     EF 60-65%, echo, March, 2012, moderate diastolic dysfunction  . 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 Sherene Sires; has chronic shortness of breath  . Atrial fibrillation     April, 2013, new diagnosis  . Bradycardia     Sinus bradycardia, asymptomatic, April, 2013  . Carotid artery disease     Dopplers to be checked, June, 2013  . Cough     October, 2013  . Tremor     October, 2013    Medications:  Amiodarone  ASA  Oscal-D  Claritin  Cozaar  Dramamine Flonase  Folic acid  Humira  MVI  Ntg  Prilosec  Prednisone  Zocor  Vit D   Coumadin 5 mg TTSat, 2.5 mg MWFSun  Assessment: 72 yo male admitted with syncope, h/o PE and Afib to continue Coumadin  Goal of Therapy:  INR 2-3 Monitor platelets by anticoagulation protocol: Yes   Plan:  Daily INR  Abbott, Gary Fleet 03/24/2013,5:46 AM  Admit INR 2.0 at goal 2-3, no bleeding noted Continue home dose warfarin 5mg  TTSa/2.5 MWFSu  Leota Sauers Pharm.D. CPP, BCPS Clinical Pharmacist [phone] 03/24/2013 1:54 PM

## 2013-03-24 NOTE — Progress Notes (Signed)
Patient admitted to 3w16 via care link. Patient alert, oriented, verbal, and able to follow commands with no difficulty. Vital signs stable. O2 on at 2L/min for O2 support.  Patient denies pain, discomfort, or dizziness. Breath sounds clear bilaterally. Patient assisted to bed with  EKG monitor placed on patient. Patient oriented to room. Dr. Mayford Knife (cardiology) paged and notified of patient's arrival to unit. Will continue to monitor.

## 2013-03-24 NOTE — Progress Notes (Signed)
Utilization review completed.  

## 2013-03-24 NOTE — H&P (Addendum)
Admit date: 03/24/2013 Referring Physician [facility] Primary Cardiologist Dr. Lenard Galloway Chief complaint/reason for admission: Syncope  HPI: This is a 72yo WM with a history of SVT, CAD s/p CABG and PE on chronic anticoagulation who was just discharged yesterday from [facility] after workup of syncope.  He apparently was having chest pain and had syncope and was admitted to [facility].  He went home yesterday after his cardiac cath was unremarkable per the patient.  He says that when he got home he all of a sudden felt weak while standing in the bathroom and fell but didn ot injure himself.  He woke up and he was on the floor. EMS was called and BP on arrival was 98/65mmHg and he was given an IVF bolus.   He had no symptoms before the syncopal event. He is now transferred to [facility] for loop recorder in the am.    PMH:    Past Medical History  Diagnosis Date  . Syncope and collapse     Evaluation by Dr Turner Daniels; Syncope probably neutrally mediated and modest resting sinus bradycardia. and 16 B. episode of SVT that was either A fib or another type of SVT. Patient improved with combination of holding ACE inhibitor or low BP and reducing beta blocker for bradycardia  . Hypertension                                                                                                                                                                                                                                                                                                                                                                                                                       .  Coronary artery disease     relook cath 08/12/08 - all 5 grafts are patent  . Crohn's disease     Humara Rx in past  . SVT (supraventricular tachycardia)   . 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 History of benign colon tumor   . Sleep apnea     questionable  . Mitral regurgitation     mild - echo 4/09  . Anxiety   .  Gait difficulty     with Crestor (but tolerates simva)  . Pulmonary embolism     2007,b CT scan, IVC fiter placed then because of concern about coumadin use at that time.  . Orthostasis     treated  . Drug therapy     Intermittant steroids   . S/P IVC filter     2007  PE  . Warfarin anticoagulation   . Factor VIII     Factor VIII EXCESS.Marland KitchenMarland KitchenDr Hampton Abbot.Marland Kitchenanother reason for coumadin  . Hx of CABG     2008  . Incomplete RBBB   . Drug therapy     question rash diltiazem, and amio  . Ejection fraction     EF 60-65%, echo, March, 2012, moderate diastolic dysfunction  . 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 Sherene Sires; has chronic shortness of breath  . Atrial fibrillation     April, 2013, new diagnosis  . Bradycardia     Sinus bradycardia, asymptomatic, April, 2013  . Carotid artery disease     Dopplers to be checked, June, 2013  . Cough     October, 2013  . Tremor     October, 2013    PSH:    Past Surgical History  Procedure Laterality Date  . Coronary artery bypass graft  4/08  . Cholecystectomy    . Back surgery      x2  . Cataract extraction      x2  . Colonoscopy w/ polypectomy      ALLERGIES:   Codeine; Diltiazem hcl; Doxycycline; Infliximab; Mercaptopurine; Mesalamine; Methotrexate; Metronidazole; and Pyridostigmine bromide  Prior to Admit Meds:   Prescriptions prior to admission  Medication Sig Dispense Refill  . adalimumab (HUMIRA PEN) 40 MG/0.8ML injection Inject 40 mg into the skin.       Marland Kitchen ALPRAZolam (XANAX) 0.5 MG tablet as needed.      Marland Kitchen amiodarone (PACERONE) 200 MG tablet Take 200mg  (1 tab) alternating with 100mg  (1/2 tab) every other day.  30 tablet  6  . aspirin 81 MG tablet Take 81 mg by mouth daily.        . Calcium Carbonate-Vit D-Min 600-400 MG-UNIT TABS Take 1 tablet by mouth daily.        . cholecalciferol (VITAMIN D) 1000 UNITS tablet Take 1,000 Units by mouth daily.        Marland Kitchen dimenhyDRINATE (DRAMAMINE) 50 MG tablet Take 50 mg by mouth every 8 (eight) hours  as needed.        . donepezil (ARICEPT) 10 MG tablet Take 10 mg by mouth daily.      . fluticasone (FLONASE) 50 MCG/ACT nasal spray 2 sprays by Nasal route daily.        . folic acid (FOLVITE) 1 MG tablet Take 1 tablet by mouth daily.       Marland Kitchen loratadine (CLARITIN) 10 MG tablet 2 hrs before Humira       . losartan (COZAAR) 100 MG tablet Take 1 tablet (100 mg total) by mouth daily.  90 tablet  3  . Multiple Vitamin (MULTIVITAMIN) tablet Take 1 tablet by mouth daily.        . nitroGLYCERIN (NITROSTAT) 0.4 MG SL tablet Take 1 tablet (0.4 mg total) by mouth as directed.  25 tablet  6  . omeprazole (PRILOSEC) 40 MG capsule TAKE 1 CAPSULE BY MOUTH DAILY.  30 capsule  1  . predniSONE (DELTASONE) 20 MG tablet Take 20 mg by mouth as needed. 30mg , 30mg ,  two days prior to humira day of humira takes 20mg       . ranitidine (ZANTAC) 150 MG tablet 2 hrs before Humira       . simvastatin (ZOCOR) 40 MG tablet Take 1 tablet by mouth daily.      Marland Kitchen warfarin (COUMADIN) 5 MG tablet Take 1 tablet (5 mg total) by mouth as directed.  40 tablet  3   Family HX:    Family History  Problem Relation Age of Onset  . Coronary artery disease     Social HX:    History   Social History  . Marital Status: Married    Spouse Name: N/A    Number of Children: N/A  . Years of Education: N/A   Occupational History  . retired    Social History Main Topics  . Smoking status: Former Smoker    Types: Cigarettes    Quit date: 08/12/1961  . Smokeless tobacco: Never Used     Comment: smoked occ in his teenage yrs  . Alcohol Use: No  . Drug Use: No  . Sexual Activity: Not Currently   Other Topics Concern  . Not on file   Social History Narrative  . No narrative on file     ROS:  All 11 ROS were addressed and are negative except what is stated in the HPI  PHYSICAL EXAM There were no vitals filed for this visit. General: Well developed, well nourished, in no acute distress Head: Eyes PERRLA, No xanthomas.   Normal  cephalic and atramatic  Lungs:   Clear bilaterally to auscultation and percussion. Heart:   HRRR S1 S2 Pulses are 2+ & equal.            No carotid bruit. No JVD.  No abdominal bruits. No femoral bruits. Abdomen: Bowel sounds are positive, abdomen soft and non-tender without masses  Extremities:   No clubbing, cyanosis or edema.  DP +1 Neuro: Alert and oriented X 3. Psych:  Good affect, responds appropriately   Labs:   Lab Results  Component Value Date   WBC 8.3 02/06/2009   HGB 14.6 02/06/2009   HCT 42.8 02/06/2009   MCV 92.0 02/06/2009   PLT 246.0 02/06/2009   No results found for this basename: NA, K, CL, CO2, BUN, CREATININE, CALCIUM, LABALBU, PROT, BILITOT, ALKPHOS, ALT, AST, GLUCOSE,  in the last 168 hours No results found for this basename: CKTOTAL, CKMB, CKMBINDEX, TROPONINI   No results found for this basename: PTT   Lab Results  Component Value Date   INR 2.5 03/03/2013   INR 3.1 02/03/2013   INR 2.4 12/30/2012   PROTIME 17.6 01/13/2009        EKG:  NSR with nonspecific ST abnormality  ASSESSMENT:  1.  Syncope of unclear etiology 2.  CAD s/p remote CABG with cath yesterday unremarkable 3.  History of remote PE on chronic anticoagulation 4.  History of SVT 5.  History of asymptomatic bradycardia PLAN:   1.  NPO 2.  Plan Loop recorder in  am 3.  Pharmacy to dose coumadin 4.  Patient's wife stated that Dr. Lenard Galloway wanted a Neuro workup as well while patient in hospital - [facility] to consult in am Quintella Reichert, MD  03/24/2013  3:14 AM

## 2013-03-25 DIAGNOSIS — I2510 Atherosclerotic heart disease of native coronary artery without angina pectoris: Secondary | ICD-10-CM

## 2013-03-25 DIAGNOSIS — I4891 Unspecified atrial fibrillation: Secondary | ICD-10-CM

## 2013-03-25 LAB — PROTIME-INR: INR: 2.99 — ABNORMAL HIGH (ref 0.00–1.49)

## 2013-03-25 MED ORDER — WARFARIN SODIUM 2.5 MG PO TABS
2.5000 mg | ORAL_TABLET | Freq: Once | ORAL | Status: DC
  Filled 2013-03-25: qty 1

## 2013-03-25 NOTE — Discharge Summary (Signed)
ELECTROPHYSIOLOGY DISCHARGE SUMMARY    Patient ID: [name] [name],  MRN: [id number], DOB/AGE: 1940-09-29 72 y.o.  Admit date: 03/24/2013 Discharge date: 03/25/2013  Primary Care Physician: Julieanne Manson, MD Primary Cardiologist / EP: Mariah Milling, MD / Graciela Husbands, MD  Primary Discharge Diagnosis:  1. Recurrent syncope s/p ILR insertion   - doing well this AM without complaints   - wound intact without significant bleeding or hematoma   - orthostatics negative   Secondary Discharge Diagnoses:  1. CAD s/p CABG   - recent cardiac cath at [facility] unremarkable - 5/5 grafts patent, EF 50%   - continue medical therapy  2. PAF  3. History of PE   - s/p IVC filter placement; also on warfarin  4. History of SVT  5. Elevated transaminases  6. Recently diagnosed Alzheimer's dementia  Procedures This Admission:  1. Implantable loop recorder insertion 03/24/2013 Med[employer]onic LINQ Reveal Loop Recorder Serial Number [id number]  History and Hospital Course:  [name] [name] is a 72 year old man with CAD s/p CABG, PAF, PSVT, preserved LV function and a long standing history of syncope dating back 8-10 years. It became quiescent following bypass surgery 2008. Over the last 3-4 years there have been recurrences. 8-12 overall with 3 in the last week to 10 days. They all occur with a prodrome characterized by dizziness, flushing, nausea, pallor and diaphoresis. They can sometimes be aborted by sitting or lying down. Residual orthostatic intolerance. Blood pressures have been checked on numerous occasions with SBP recordings in the 90s which is far below his normal of 130-170. He is diaphoretic and washed out afterward. The patient and family have a great deal of frustration given the escalating frequency. He was admitted to [facility] yesterday for further evaluation. Dr. Graciela Husbands suspected "he has orthostatic intolerance. He might however have a neurally mediated syncope triggered by his known tachycardia arrhythmia. Treatment strategies  would be significantly different and so loop recorder insertion to identify potential treatable triggers makes sense." He therefore underwent ILR insertion yesterday. [name] [name] tolerated this procedure well without any immediate complication. He remains hemodynamically stable and afebrile. His insertion site is intact without significant bleeding or hematoma. He is ambulating without difficulty. He has been given discharge instructions including wound care. He was instructed not to drive for 6 months until given clearance by Dr. Mariah Milling and/or Dr. Graciela Husbands. He will follow-up in 10 days for wound check. There were no changes made to his medications. He has been seen, examined and deemed stable for discharge today by Dr. Berton Mount.  Discharge Vitals: Blood pressure 157/93, pulse 81, temperature 98.1 F (36.7 C), temperature source Oral, resp. rate 19, height 5\' 10"  (1.778 m), weight 214 lb 1.6 oz (97.115 kg), SpO2 90.00%.   Labs: Lab Results  Component Value Date   WBC 4.9 03/24/2013   HGB 14.6 03/24/2013   HCT 41.0 03/24/2013   MCV 89.3 03/24/2013   PLT 224 03/24/2013     Recent Labs Lab 03/24/13 0530  NA 133*  K 4.4  CL 102  CO2 22  BUN 11  CREATININE 1.05  CALCIUM 8.5  PROT 5.7*  BILITOT 0.3  ALKPHOS 157*  ALT 162*  AST 98*  GLUCOSE 94    Recent Labs  03/25/13 0830  INR 2.99*    Disposition:  The patient is being discharged in stable condition.  Follow-up:     Follow-up Information   Follow up with [facility] On 04/05/2013. (At 12:30 PM for wound check)  Specialty:  Electrophysiology   Contact information:   [address], [address] [place] [address] [phone]      Follow up with Julien Nordmann, MD On 04/15/2013. (At 8:45 AM)    Specialty:  Cardiology   Contact information:   [facility] [address] [place] [address] [phone]      Discharge Medications:    Medication List    ASK your  doctor about these medications       ALPRAZolam 0.5 MG tablet  Commonly known as:  XANAX  Take 0.5 mg by mouth at bedtime as needed for sleep.     amiodarone 200 MG tablet  Commonly known as:  PACERONE  Take 200mg  (1 tab) alternating with 100mg  (1/2 tab) every other day.     aspirin 81 MG tablet  Take 81 mg by mouth daily.     Calcium Carbonate-Vit D-Min 600-400 MG-UNIT Tabs  Take 1 tablet by mouth daily.     cholecalciferol 1000 UNITS tablet  Commonly known as:  VITAMIN D  Take 1,000 Units by mouth daily.     dimenhyDRINATE 50 MG tablet  Commonly known as:  DRAMAMINE  Take 50 mg by mouth every 8 (eight) hours as needed.     donepezil 10 MG tablet  Commonly known as:  ARICEPT  Take 10 mg by mouth daily.     fluticasone 50 MCG/ACT nasal spray  Commonly known as:  FLONASE  2 sprays by Nasal route daily.     folic acid 1 MG tablet  Commonly known as:  FOLVITE  Take 1 tablet by mouth daily.     HUMIRA PEN 40 MG/0.8ML injection  Generic drug:  adalimumab  Inject 40 mg into the skin.     loratadine 10 MG tablet  Commonly known as:  CLARITIN  2 hrs before Humira     losartan 100 MG tablet  Commonly known as:  COZAAR  Take 1 tablet (100 mg total) by mouth daily.     multivitamin tablet  Take 1 tablet by mouth daily.     nitroGLYCERIN 0.4 MG SL tablet  Commonly known as:  NITROSTAT  Take 1 tablet (0.4 mg total) by mouth as directed.     omeprazole 40 MG capsule  Commonly known as:  PRILOSEC  Take 40 mg by mouth daily.     predniSONE 20 MG tablet  Commonly known as:  DELTASONE  Take 20 mg by mouth as needed. 30mg , 30mg ,  two days prior to humira day of humira takes 20mg      simvastatin 40 MG tablet  Commonly known as:  ZOCOR  Take 1 tablet by mouth daily.     warfarin 5 MG tablet  Commonly known as:  COUMADIN  Take 2.5-5 mg by mouth daily. 2.5 mg on Sun, Mon, Wed, Fri. And 5 mg on Tues, Thurs, Saturday     ZANTAC 150 MG tablet  Generic drug:  ranitidine    2 hrs before Humira       Duration of Discharge Encounter: Greater than 30 minutes including physician time.  Signed, Rick Duff, PA-C 03/25/2013, 3:11 PM   Ambulating a little without complaints BP 130/65  Pulse 60  Temp(Src) 98.2 F (36.8 C) (Oral)  Resp 17  Ht 5\' 10"  (1.778 m)  Wt 206 lb 4.8 oz (93.577 kg)  BMI 29.6 kg/m2  SpO2 95% Well developed and nourished in no acute distress HENT normal Neck supple with JVP-flat Clear Regular  rate and rhythm, no murmurs or gallops Abd-soft with active BS No Clubbing cyanosis edema Skin-warm and dry A & Oriented  Grossly normal sensory and motor function  For discharge today

## 2013-03-25 NOTE — Progress Notes (Signed)
     Patient: [name] [name] Date of Encounter: 03/25/2013, 7:51 AM Admit date: 03/24/2013     Subjective  [name] [name] has no complaints this AM. He denies CP, SOB, palpitations, dizziness or syncope.   Objective  Physical Exam: Vitals: BP 157/93  Pulse 81  Temp(Src) 98.1 F (36.7 C) (Oral)  Resp 19  Ht 5\' 10"  (1.778 m)  Wt 214 lb 1.6 oz (97.115 kg)  BMI 30.72 kg/m2  SpO2 90% General: Well developed, well appearing 72 year old male in no acute distress. Neck: Supple. JVD not elevated. Lungs: Clear bilaterally to auscultation without wheezes, rales, or rhonchi. Breathing is unlabored. Heart: RRR S1 S2 without murmurs, rubs, or gallops.  Abdomen: Soft, non-distended. Extremities: No clubbing or cyanosis. No edema.  Distal pedal pulses are 2+ and equal bilaterally. Neuro: Alert and oriented X 3. Moves all extremities spontaneously. No focal deficits. Skin: ILR insertion site intact without significant bleeding or hematoma.  Intake/Output: No intake or output data in the 24 hours ending 03/25/13 0751  Inpatient Medications:  . amiodarone  200 mg Oral Daily  . aspirin  81 mg Oral Daily  . donepezil  10 mg Oral Daily  . fluticasone  2 spray Each Nare Daily  . folic acid  1 mg Oral Daily  . losartan  100 mg Oral Daily  . pantoprazole  80 mg Oral Daily  . simvastatin  40 mg Oral Daily  . sodium chloride  3 mL Intravenous Q12H  . sodium chloride  3 mL Intravenous Q12H  . warfarin  2.5 mg Oral Q M,W,F,Su-1800  . warfarin  5 mg Oral Q T,Th,Sat-1800  . Warfarin - Pharmacist Dosing Inpatient   Does not apply q1800    Labs:  Recent Labs  03/24/13 0530  NA 133*  K 4.4  CL 102  CO2 22  GLUCOSE 94  BUN 11  CREATININE 1.05  CALCIUM 8.5    Recent Labs  03/24/13 0530  AST 98*  ALT 162*  ALKPHOS 157*  BILITOT 0.3  PROT 5.7*  ALBUMIN 3.0*    Recent Labs  03/24/13 0530  WBC 4.9  NEUTROABS 2.0  HGB 14.6  HCT 41.0  MCV 89.3  PLT 224    Recent Labs  03/24/13 0530   INR 2.00*    Radiology/Studies: No results found.  Echocardiogram at [facility] March 2014  Mild concentric LVH, LVEF 50-55%, grade I diastolic dysfunction, normal RV function, mild LAE, no significant valvular abnormalities  12-lead ECG yesterday from [facility] shows sinus bradycardia at 59 bpm with incomplete RBBB  Telemetry: SR; no arrhythmias    Assessment and Plan  1. Recurrent syncope s/p ILR insertion - doing well this AM without complaints - wound intact without significant bleeding or hematoma - orthostatics negative 2. CAD s/p CABG  - recent cardiac cath at [facility] unremarkable - 5/5 grafts patent, EF 50%  - continue medical therapy  3. PAF  4. History of PE  - s/p IVC filter placement; also on warfarin  5. History of SVT 6. Elevated transaminases 7. Recently diagnosed Alzheimer's dementia  [name] [name] to see Signed, [name] [name]  Orthostatics surprisingly unimpressive Ok to discharge  Wound check 10 days F/u TG/SK  LHB 2-3 weeks

## 2013-03-25 NOTE — Progress Notes (Signed)
ANTICOAGULATION CONSULT NOTE - follow up Pharmacy Consult for Coumadin Indication: h/o Afib  Allergies  Allergen Reactions  . Codeine   . Diltiazem Hcl   . Doxycycline   . Infliximab   . Mercaptopurine   . Mesalamine   . Methotrexate   . Metronidazole   . Pyridostigmine Bromide     Patient Measurements: Height: 5\' 10"  (177.8 cm) Weight: 214 lb 1.6 oz (97.115 kg) IBW/kg (Calculated) : 73  Vital Signs: Temp: 98.1 F (36.7 C) (08/14 0544) Temp src: Oral (08/14 0544) BP: 157/93 mmHg (08/14 0550) Pulse Rate: 81 (08/14 0550)  Labs (at [facility]): WBC  6.1 Hgb 14.8 Hct 42.9 Plt 218  INR 1.7 (8/12 0630)   Recent Labs  03/24/13 0530 03/25/13 0830  HGB 14.6  --   HCT 41.0  --   PLT 224  --   APTT 39*  --   LABPROT 22.1* 30.0*  INR 2.00* 2.99*  CREATININE 1.05  --     Estimated Creatinine Clearance: 73.2 ml/min (by C-G formula based on Cr of 1.05).   Medical History: Past Medical History  Diagnosis Date  . Syncope and collapse     Evaluation by Dr Turner Daniels; Syncope probably neutrally mediated and modest resting sinus bradycardia. and 16 B. episode of SVT that was either A fib or another type of SVT. Patient improved with combination of holding ACE inhibitor or low BP and reducing beta blocker for bradycardia  . Hypertension                                                                                                                                                                                                                                                                                                                                                                                                                       .  Coronary artery disease     relook cath 08/12/08 - all 5 grafts are patent  . Crohn's disease     Humara Rx in past  . SVT (supraventricular tachycardia)   . 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 History of benign colon tumor   . Sleep apnea    questionable  . Mitral regurgitation     mild - echo 4/09  . Anxiety   . Gait difficulty     with Crestor (but tolerates simva)  . Pulmonary embolism     2007,b CT scan, IVC fiter placed then because of concern about coumadin use at that time.  . Orthostasis     treated  . Drug therapy     Intermittant steroids   . S/P IVC filter     2007  PE  . Warfarin anticoagulation   . Factor VIII     Factor VIII EXCESS.Marland KitchenMarland KitchenDr Hampton Abbot.Marland Kitchenanother reason for coumadin  . Hx of CABG     2008  . Incomplete RBBB   . Drug therapy     question rash diltiazem, and amio  . Ejection fraction     EF 60-65%, echo, March, 2012, moderate diastolic dysfunction  . 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 Sherene Sires; has chronic shortness of breath  . Atrial fibrillation     April, 2013, new diagnosis  . Bradycardia     Sinus bradycardia, asymptomatic, April, 2013  . Carotid artery disease     Dopplers to be checked, June, 2013  . Cough     October, 2013  . Tremor     October, 2013    Medications:  Amiodarone  ASA  Oscal-D  Claritin  Cozaar  Dramamine Flonase  Folic acid  Humira  MVI  Ntg  Prilosec  Prednisone  Zocor  Vit D   Coumadin 5 mg TTSat, 2.5 mg MWFSun  Assessment: 72 yo male admitted with syncope, h/o PE and Afib to continue Coumadin.    Large jump in INR today 2.0 to 2.99.  INR therapeutic.  No bleeding reported. home dose warfarin 5mg  TTSa/2.5 MWFSu   Goal of Therapy:  INR 2-3    Plan:  Coumadin 2.5 mg today. Daily INR.  Possibly home today. home dose warfarin 5mg  TTSa/2.5 MWFSu Herby Abraham, Pharm.D. [phone] 03/25/2013 9:52 AM

## 2013-03-26 LAB — PROTIME-INR: Prothrombin Time: 32.8 seconds — ABNORMAL HIGH (ref 11.6–15.2)

## 2013-03-26 NOTE — Progress Notes (Addendum)
D/c instructions reviewed with pt and wife AT 1820. Copy of instructions given to pt/wife. D/c'd via wheelchair with belongings, escorted by unit NT.

## 2013-03-26 NOTE — Progress Notes (Signed)
Pt has walked in hall today with staff and with wife, has tolerated well each time with no c/o pain, SOB or dizziness. Anticipate pt going home today per Dr Clide Cliff this am if tolerates ambulating in halls, have waited for MD/PA to return today to d/c pt.   Paged Trish with [facility] and asked for PA/NP to d/c pt home if still planning to do so. She will notify them.

## 2013-03-26 NOTE — Progress Notes (Signed)
ANTICOAGULATION CONSULT NOTE - follow up Pharmacy Consult for Coumadin Indication: h/o Afib  Allergies  Allergen Reactions  . Codeine   . Diltiazem Hcl   . Doxycycline   . Infliximab   . Mercaptopurine   . Mesalamine   . Methotrexate   . Metronidazole   . Pyridostigmine Bromide     Patient Measurements: Height: 5\' 10"  (177.8 cm) Weight: 206 lb 4.8 oz (93.577 kg) IBW/kg (Calculated) : 73  Vital Signs: Temp: 98.2 F (36.8 C) (08/15 0536) Temp src: Oral (08/15 0536) BP: 130/65 mmHg (08/15 0536) Pulse Rate: 60 (08/15 0536)  Labs (at [facility]): WBC  6.1 Hgb 14.8 Hct 42.9 Plt 218  INR 1.7 (8/12 0630)   Recent Labs  03/24/13 0530 03/25/13 0830 03/26/13 0536  HGB 14.6  --   --   HCT 41.0  --   --   PLT 224  --   --   APTT 39*  --   --   LABPROT 22.1* 30.0* 32.8*  INR 2.00* 2.99* 3.36*  CREATININE 1.05  --   --     Estimated Creatinine Clearance: 72 ml/min (by C-G formula based on Cr of 1.05).   Medical History: Past Medical History  Diagnosis Date  . Syncope and collapse     Evaluation by Dr Turner Daniels; Syncope probably neutrally mediated and modest resting sinus bradycardia. and 16 B. episode of SVT that was either A fib or another type of SVT. Patient improved with combination of holding ACE inhibitor or low BP and reducing beta blocker for bradycardia  . Hypertension                                                                                                                                                                                                                                                                                                                                                                                                                       .  Coronary artery disease     relook cath 08/12/08 - all 5 grafts are patent  . Crohn's disease     Humara Rx in past  . SVT (supraventricular tachycardia)   . 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  History of benign colon tumor   . Sleep apnea     questionable  . Mitral regurgitation     mild - echo 4/09  . Anxiety   . Gait difficulty     with Crestor (but tolerates simva)  . Pulmonary embolism     2007,b CT scan, IVC fiter placed then because of concern about coumadin use at that time.  . Orthostasis     treated  . Drug therapy     Intermittant steroids   . S/P IVC filter     2007  PE  . Warfarin anticoagulation   . Factor VIII     Factor VIII EXCESS.Marland KitchenMarland KitchenDr Hampton Abbot.Marland Kitchenanother reason for coumadin  . Hx of CABG     2008  . Incomplete RBBB   . Drug therapy     question rash diltiazem, and amio  . Ejection fraction     EF 60-65%, echo, March, 2012, moderate diastolic dysfunction  . 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 Sherene Sires; has chronic shortness of breath  . Atrial fibrillation     April, 2013, new diagnosis  . Bradycardia     Sinus bradycardia, asymptomatic, April, 2013  . Carotid artery disease     Dopplers to be checked, June, 2013  . Cough     October, 2013  . Tremor     October, 2013    Medications:  Amiodarone  ASA  Oscal-D  Claritin  Cozaar  Dramamine Flonase  Folic acid  Humira  MVI  Ntg  Prilosec  Prednisone  Zocor  Vit D   Coumadin 5 mg TTSat, 2.5 mg MWFSun  Assessment: 72 yo male admitted with syncope, h/o PE and Afib to continue Coumadin.    Large jump in INR 2.0 > 2.99> 3.27.  INR above goal.  Noted plans to d/c home today.  Goal of Therapy:  INR 2-3   Plan:  No Coumadin today. If d/c home, would restart home Coumadin dose 8/16.  [employer], Pharm.D., BCPS Clinical Pharmacist Pager [phone] 03/26/2013 1:50 PM

## 2013-03-28 ENCOUNTER — Other Ambulatory Visit: Payer: Self-pay | Admitting: Cardiology

## 2013-03-31 ENCOUNTER — Ambulatory Visit (INDEPENDENT_AMBULATORY_CARE_PROVIDER_SITE_OTHER): Payer: Medicare Other | Admitting: *Deleted

## 2013-03-31 DIAGNOSIS — Z7901 Long term (current) use of anticoagulants: Secondary | ICD-10-CM

## 2013-03-31 DIAGNOSIS — I2699 Other pulmonary embolism without acute cor pulmonale: Secondary | ICD-10-CM

## 2013-03-31 DIAGNOSIS — D66 Hereditary factor VIII deficiency: Secondary | ICD-10-CM

## 2013-03-31 MED ORDER — WARFARIN SODIUM 5 MG PO TABS: 5.0000 mg | ORAL_TABLET | ORAL | Status: DC

## 2013-04-01 ENCOUNTER — Inpatient Hospital Stay (HOSPITAL_COMMUNITY)
Admission: EM | Admit: 2013-04-01 | Discharge: 2013-04-03 | DRG: 243 | Disposition: A | Discharge status: Home / Self Care | Payer: Medicare Other | Attending: Internal Medicine | Admitting: Internal Medicine

## 2013-04-01 ENCOUNTER — Ambulatory Visit: Payer: Self-pay | Admitting: Family Medicine

## 2013-04-01 ENCOUNTER — Telehealth: Payer: Self-pay | Admitting: *Deleted

## 2013-04-01 ENCOUNTER — Encounter (HOSPITAL_COMMUNITY): Payer: Self-pay | Admitting: *Deleted

## 2013-04-01 DIAGNOSIS — Z79899 Other long term (current) drug therapy: Secondary | ICD-10-CM

## 2013-04-01 DIAGNOSIS — Z7901 Long term (current) use of anticoagulants: Secondary | ICD-10-CM

## 2013-04-01 DIAGNOSIS — Z888 Allergy status to other drugs, medicaments and biological substances status: Secondary | ICD-10-CM

## 2013-04-01 DIAGNOSIS — Z86711 Personal history of pulmonary embolism: Secondary | ICD-10-CM

## 2013-04-01 DIAGNOSIS — F0280 Dementia in other diseases classified elsewhere without behavioral disturbance: Secondary | ICD-10-CM | POA: Diagnosis present

## 2013-04-01 DIAGNOSIS — G309 Alzheimer's disease, unspecified: Secondary | ICD-10-CM | POA: Diagnosis present

## 2013-04-01 DIAGNOSIS — I10 Essential (primary) hypertension: Secondary | ICD-10-CM

## 2013-04-01 DIAGNOSIS — E871 Hypo-osmolality and hyponatremia: Secondary | ICD-10-CM | POA: Diagnosis present

## 2013-04-01 DIAGNOSIS — K5090 Crohn's disease, unspecified, without complications: Secondary | ICD-10-CM | POA: Diagnosis present

## 2013-04-01 DIAGNOSIS — R55 Syncope and collapse: Secondary | ICD-10-CM

## 2013-04-01 DIAGNOSIS — Z8249 Family history of ischemic heart disease and other diseases of the circulatory system: Secondary | ICD-10-CM

## 2013-04-01 DIAGNOSIS — Z950 Presence of cardiac pacemaker: Secondary | ICD-10-CM

## 2013-04-01 DIAGNOSIS — G238 Other specified degenerative diseases of basal ganglia: Secondary | ICD-10-CM | POA: Diagnosis present

## 2013-04-01 DIAGNOSIS — Z95828 Presence of other vascular implants and grafts: Secondary | ICD-10-CM

## 2013-04-01 DIAGNOSIS — Z951 Presence of aortocoronary bypass graft: Secondary | ICD-10-CM

## 2013-04-01 DIAGNOSIS — Z9089 Acquired absence of other organs: Secondary | ICD-10-CM

## 2013-04-01 DIAGNOSIS — E669 Obesity, unspecified: Secondary | ICD-10-CM | POA: Diagnosis present

## 2013-04-01 DIAGNOSIS — F411 Generalized anxiety disorder: Secondary | ICD-10-CM | POA: Diagnosis present

## 2013-04-01 DIAGNOSIS — Z9849 Cataract extraction status, unspecified eye: Secondary | ICD-10-CM

## 2013-04-01 DIAGNOSIS — R001 Bradycardia, unspecified: Secondary | ICD-10-CM

## 2013-04-01 DIAGNOSIS — D649 Anemia, unspecified: Secondary | ICD-10-CM | POA: Diagnosis present

## 2013-04-01 DIAGNOSIS — Z87891 Personal history of nicotine dependence: Secondary | ICD-10-CM

## 2013-04-01 DIAGNOSIS — I495 Sick sinus syndrome: Principal | ICD-10-CM | POA: Diagnosis present

## 2013-04-01 DIAGNOSIS — I2510 Atherosclerotic heart disease of native coronary artery without angina pectoris: Secondary | ICD-10-CM

## 2013-04-01 DIAGNOSIS — IMO0002 Reserved for concepts with insufficient information to code with codable children: Secondary | ICD-10-CM

## 2013-04-01 DIAGNOSIS — Z7982 Long term (current) use of aspirin: Secondary | ICD-10-CM

## 2013-04-01 DIAGNOSIS — I4891 Unspecified atrial fibrillation: Secondary | ICD-10-CM | POA: Diagnosis present

## 2013-04-01 DIAGNOSIS — I498 Other specified cardiac arrhythmias: Secondary | ICD-10-CM

## 2013-04-01 LAB — BASIC METABOLIC PANEL
BUN: 12 mg/dL (ref 6–23)
CO2: 25 mEq/L (ref 19–32)
Calcium: 9.5 mg/dL (ref 8.4–10.5)
Chloride: 91 mEq/L — ABNORMAL LOW (ref 96–112)
Creatinine, Ser: 1.09 mg/dL (ref 0.50–1.35)
GFR calc Af Amer: 76 mL/min — ABNORMAL LOW (ref >90)
GFR calc non Af Amer: 65 mL/min — ABNORMAL LOW (ref >90)
Glucose, Bld: 91 mg/dL (ref 70–99)
Potassium: 4.7 mEq/L (ref 3.5–5.1)
Sodium: 126 mEq/L — ABNORMAL LOW (ref 135–145)

## 2013-04-01 LAB — POCT I-STAT TROPONIN I: Troponin i, poc: 0 ng/mL (ref 0.00–0.08)

## 2013-04-01 LAB — CBC
HCT: 42.1 % (ref 39.0–52.0)
Hemoglobin: 15.2 g/dL (ref 13.0–17.0)
MCH: 31.9 pg (ref 26.0–34.0)
MCHC: 36.1 g/dL — ABNORMAL HIGH (ref 30.0–36.0)
MCV: 88.3 fL (ref 78.0–100.0)
Platelets: 319 10*3/uL (ref 150–400)
RBC: 4.77 MIL/uL (ref 4.22–5.81)
RDW: 14.7 % (ref 11.5–15.5)
WBC: 5.1 10*3/uL (ref 4.0–10.5)

## 2013-04-01 LAB — PROTIME-INR
INR: 3.06 — ABNORMAL HIGH (ref 0.00–1.49)
Prothrombin Time: 30.5 seconds — ABNORMAL HIGH (ref 11.6–15.2)

## 2013-04-01 NOTE — ED Notes (Signed)
Pt in after two syncopal episodes at home this morning, wife states the patient slept most of the day after that and he has been weaker than normal, pt is being evaluated currently for recent heart issues and was admitted recently, states she called cardiologist and was told the patient would have a pacemaker placed in the next day or two, pt has an internal cardiac monitor and the cardiologist was concerned about the information that was transmitted in relation to the syncopal episodes this morning. Pt alert and oriented at this time, skin warm and pink, no distress noted, denies feeling dizzy at this time, states he feels weak.

## 2013-04-01 NOTE — ED Provider Notes (Addendum)
CSN: [id number]     Arrival date & time 04/01/13  1955 History     First MD Initiated Contact with Patient 04/01/13 2019     Chief Complaint  Patient presents with  . Loss of Consciousness   (Consider location/radiation/quality/duration/timing/severity/associated sxs/prior Treatment) HPI  72yo WM with a history of SVT, CAD s/p CABG and PE on chronic anticoagulation presenting with syncope. Recent admission and w/u for same with implantation of loop recorder. Recent cath at OSH with showed patent grafts and EF of ~50%. Today patient had a syncopal event in the shower. His wife heard a noise the shower from another room. Which is outpatient he was sitting in the shower. He is conscious but appear very pale and mildly short of breath. As he tried to get up he actually did lose consciousness. Today's event similar to prior. Most have been well standing or changing position. Patient has syncopized while said to. He currently has no complaints. Denies any chest pain, palpitations, dizziness, lightheadedness, shortness of breath or nausea. Apparently his recorder picked up an event and pt referred to the emergency room "to get a pacemaker." Patient and family are unsure about the specifics of that dysrhythmia though.    Past Medical History  Diagnosis Date  . Syncope and collapse     Evaluation by Dr Turner Daniels; Syncope probably neutrally mediated and modest resting sinus bradycardia. and 16 B. episode of SVT that was either A fib or another type of SVT. Patient improved with combination of holding ACE inhibitor or low BP and reducing beta blocker for bradycardia  . Hypertension                                                                                                                                                                                                                                                                                                                                                                                                                        .  Coronary artery disease     relook cath 08/12/08 - all 5 grafts are patent  . Crohn's disease     Humara Rx in past  . SVT (supraventricular tachycardia)   . 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 History of benign colon tumor   . Sleep apnea     questionable  . Mitral regurgitation     mild - echo 4/09  . Anxiety   . Gait difficulty     with Crestor (but tolerates simva)  . Pulmonary embolism     2007,b CT scan, IVC fiter placed then because of concern about coumadin use at that time.  . Orthostasis     treated  . Drug therapy     Intermittant steroids   . S/P IVC filter     2007  PE  . Warfarin anticoagulation   . Factor VIII     Factor VIII EXCESS.Marland KitchenMarland KitchenDr Hampton Abbot.Marland Kitchenanother reason for coumadin  . Hx of CABG     2008  . Incomplete RBBB   . Drug therapy     question rash diltiazem, and amio  . Ejection fraction     EF 60-65%, echo, March, 2012, moderate diastolic dysfunction  . 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 Sherene Sires; has chronic shortness of breath  . Atrial fibrillation     April, 2013, new diagnosis  . Bradycardia     Sinus bradycardia, asymptomatic, April, 2013  . Carotid artery disease     Dopplers to be checked, June, 2013  . Cough     October, 2013  . Tremor     October, 2013   Past Surgical History  Procedure Laterality Date  . Coronary artery bypass graft  4/08  . Cholecystectomy    . Back surgery      x2  . Cataract extraction      x2  . Colonoscopy w/ polypectomy     Family History  Problem Relation Age of Onset  . Coronary artery disease     History  Substance Use Topics  . Smoking status: Former Smoker    Types: Cigarettes    Quit date: 08/12/1961  . Smokeless tobacco: Never Used     Comment: smoked occ in his teenage yrs  . Alcohol Use: No    Review of Systems  .All systems reviewed and negative, other than as noted in HPI.   Allergies  Codeine; Diltiazem hcl;  Doxycycline; Infliximab; Mercaptopurine; Mesalamine; Methotrexate; Metronidazole; and Pyridostigmine bromide  Home Medications   Current Outpatient Rx  Name  Route  Sig  Dispense  Refill  . adalimumab (HUMIRA PEN) 40 MG/0.8ML injection   Subcutaneous   Inject 40 mg into the skin.          Marland Kitchen ALPRAZolam (XANAX) 0.5 MG tablet   Oral   Take 0.5 mg by mouth at bedtime as needed for sleep.         Marland Kitchen amiodarone (PACERONE) 200 MG tablet      Take 200mg  (1 tab) alternating with 100mg  (1/2 tab) every other day.   30 tablet   6   . aspirin 81 MG tablet   Oral   Take 81 mg by mouth daily.           . Calcium Carbonate-Vit D-Min 600-400 MG-UNIT TABS   Oral   Take 1 tablet by mouth daily.           . cholecalciferol (VITAMIN D) 1000 UNITS tablet  Oral   Take 1,000 Units by mouth daily.           Marland Kitchen dimenhyDRINATE (DRAMAMINE) 50 MG tablet   Oral   Take 50 mg by mouth every 8 (eight) hours as needed.           . donepezil (ARICEPT) 10 MG tablet   Oral   Take 10 mg by mouth daily.         . fluticasone (FLONASE) 50 MCG/ACT nasal spray   Nasal   2 sprays by Nasal route daily.           . folic acid (FOLVITE) 1 MG tablet   Oral   Take 1 tablet by mouth daily.          Marland Kitchen loratadine (CLARITIN) 10 MG tablet      2 hrs before Humira          . losartan (COZAAR) 100 MG tablet   Oral   Take 1 tablet (100 mg total) by mouth daily.   90 tablet   3   . Multiple Vitamin (MULTIVITAMIN) tablet   Oral   Take 1 tablet by mouth daily.           . nitroGLYCERIN (NITROSTAT) 0.4 MG SL tablet   Oral   Take 1 tablet (0.4 mg total) by mouth as directed.   25 tablet   6   . omeprazole (PRILOSEC) 40 MG capsule   Oral   Take 40 mg by mouth daily.         . predniSONE (DELTASONE) 20 MG tablet   Oral   Take 20 mg by mouth as needed. 30mg , 30mg ,  two days prior to humira day of humira takes 20mg          . ranitidine (ZANTAC) 150 MG tablet      2 hrs before  Humira          . simvastatin (ZOCOR) 40 MG tablet   Oral   Take 1 tablet by mouth daily.         Marland Kitchen warfarin (COUMADIN) 5 MG tablet   Oral   Take 1 tablet (5 mg total) by mouth as directed.   40 tablet   3     40 tabs is 30 day supply    BP 150/85  Pulse 60  Temp(Src) 99.2 F (37.3 C) (Oral)  Resp 20  Wt 206 lb (93.441 kg)  BMI 29.56 kg/m2  SpO2 100% Physical Exam  Nursing note and vitals reviewed. Constitutional: He appears well-developed and well-nourished. No distress.  HENT:  Head: Normocephalic and atraumatic.  Eyes: Conjunctivae are normal. Right eye exhibits no discharge. Left eye exhibits no discharge.  Neck: Neck supple.  Cardiovascular: Normal rate, regular rhythm and normal heart sounds.  Exam reveals no gallop and no friction rub.   No murmur heard. Pulmonary/Chest: Effort normal and breath sounds normal. No respiratory distress.  Device L anterior chest. Site looks appropriate given timing of recent implantation.   Abdominal: Soft. He exhibits no distension. There is no tenderness.  Musculoskeletal: He exhibits no edema and no tenderness.  Neurological: He is alert.  Skin: Skin is warm and dry.  Psychiatric: He has a normal mood and affect. His behavior is normal. Thought content normal.    ED Course   Procedures (including critical care time)  Labs Reviewed  CBC - Abnormal; Notable for the following:    MCHC 36.1 (*)    All other components within normal limits  BASIC METABOLIC PANEL - Abnormal; Notable for the following:    Sodium 126 (*)    Chloride 91 (*)    GFR calc non Af Amer 65 (*)    GFR calc Af Amer 76 (*)    All other components within normal limits  PROTIME-INR - Abnormal; Notable for the following:    Prothrombin Time 30.5 (*)    INR 3.06 (*)    All other components within normal limits  PROTIME-INR - Abnormal; Notable for the following:    Prothrombin Time 28.7 (*)    INR 2.82 (*)    All other components within normal limits   BASIC METABOLIC PANEL - Abnormal; Notable for the following:    Sodium 127 (*)    Chloride 94 (*)    GFR calc non Af Amer 68 (*)    GFR calc Af Amer 78 (*)    All other components within normal limits  OSMOLALITY - Abnormal; Notable for the following:    Osmolality 264 (*)    All other components within normal limits  CBC  TSH  POCT I-STAT TROPONIN I  EKG:  Rhythm: normal sinus Vent. rate 61 BPM PR interval 174 ms QRS duration 100 ms QT/QTc 444/446 ms ST segments: ns st changes   No results found. 1. Bradycardia   2. Coronary artery disease   3. Hx of CABG   4. Hypertension   5. S/P IVC filter   6. Syncope and collapse   7. Warfarin anticoagulation   8. S/P placement of cardiac pacemaker     MDM  72yM with syncope. Cardiology evaluation.   Raeford Razor, MD 04/06/13 [id number]  Raeford Razor, MD 04/06/13 [phone]

## 2013-04-01 NOTE — H&P (Signed)
History and Physical  Patient ID: [name] [name] MRN: [id number], SOB: 1941-05-16 72 y.o. Date of Encounter: 04/01/2013, 10:28 PM  Primary Physician: Bosie Clos, MD Primary Cardiologist: Ihor Austin  Chief Complaint: syncopal event, captured on loop recorder  HPI: 72 y.o. male w/ PMHx significant for CAD s/p CABG, Crohn's disease, mild dementia, h/o PE, on chronic anticoagulation who presented to [facility] on 04/01/2013 with complaints of syncopal event x 2 this evening.   Patient has been recently evaluated by Dr. Graciela Husbands with the EP service and had a loop recorder placed to better understand the cause of Mr. Melin's syncope. Today, while showering, he felt the onset of his "dizziness" and shut off the water and sat in the tub. His wife, concerned that the water stopped, checked on him and found him unresponsive x 2 minutes sitting in the tub. He was finally arouseable and while contemplating how to get him out of the tub, he again became unresponsive and slumped against the wall of the tub. EMS was contacted at that time. His device was remotely interrogated (strips unavailable to me at this time) but reportedly demonstrated significant bradycardia that coincided with the time of the event. He was instructed to come to the ER for consideration of pacer implant.  No injuries with either of his events currently. He reports that typically he can sense it coming on and is able to crumple to the ground rather than fall (though once he did bend his glasses). These events have been going on for several years. Notably, he has been on amiodarone for this time period. Additionally, he was started on donepazil approx 6 weeks ago and wife indicates that with these medications, the number of syncopal events has increased.  They also report that they saw their PCP today who suggested further followup with neurology based upon symptoms of tremor, drooling, variable blood pressure, and cognitive issues  (mentioned Shy-Drager syndrome).  EKG revealed NSR with LAD, RSR', nonspecific ST changes.  Labs are significant for Na+ of 126. INR of 3.  No chest pain with recent events. Was cath'd last week at [facility] and told that things looked okay.  Loop inserted on 8/13.   Past Medical History  Diagnosis Date  . Syncope and collapse     Evaluation by Dr Turner Daniels; Syncope probably neutrally mediated and modest resting sinus bradycardia. and 16 B. episode of SVT that was either A fib or another type of SVT. Patient improved with combination of holding ACE inhibitor or low BP and reducing beta blocker for bradycardia  . Hypertension                                                                                                                                                                                                                                                                                                                                                                                                                       .  Coronary artery disease     relook cath 08/12/08 - all 5 grafts are patent  . Crohn's disease     Humara Rx in past  . SVT (supraventricular tachycardia)   . 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 History of benign colon tumor   . Sleep apnea     questionable  . Mitral regurgitation     mild - echo 4/09  . Anxiety   . Gait difficulty     with Crestor (but tolerates simva)  . Pulmonary embolism     2007,b CT scan, IVC fiter placed then because of concern about coumadin use at that time.  . Orthostasis     treated  . Drug therapy     Intermittant steroids   . S/P IVC filter     2007  PE  . Warfarin anticoagulation   . Factor VIII     Factor VIII EXCESS.Marland KitchenMarland KitchenDr Hampton Abbot.Marland Kitchenanother reason for coumadin  . Hx of CABG     2008  . Incomplete RBBB   . Drug therapy     question rash diltiazem, and amio  . Ejection fraction     EF 60-65%, echo, March, 2012, moderate  diastolic dysfunction  . 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 Sherene Sires; has chronic shortness of breath  . Atrial fibrillation     April, 2013, new diagnosis  . Bradycardia     Sinus bradycardia, asymptomatic, April, 2013  . Carotid artery disease     Dopplers to be checked, June, 2013  . Cough     October, 2013  . Tremor     October, 2013     Surgical History:  Past Surgical History  Procedure Laterality Date  . Coronary artery bypass graft  4/08  . Cholecystectomy    . Back surgery      x2  . Cataract extraction      x2  . Colonoscopy w/ polypectomy       Home Meds: Prior to Admission medications   Medication Sig Start Date End Date Taking? Authorizing Provider  acetaminophen (TYLENOL) 325 MG tablet Take 650 mg by mouth every 6 (six) hours as needed for pain.   Yes Historical Provider, MD  adalimumab (HUMIRA PEN) 40 MG/0.8ML injection Inject 40 mg into the skin.    Yes Historical Provider, MD  ALPRAZolam Prudy Feeler) 0.5 MG tablet Take 0.5 mg by mouth at bedtime as needed for sleep.   Yes Historical Provider, MD  amiodarone (PACERONE) 200 MG tablet Take 200mg  (1 tab) alternating with 100mg  (1/2 tab) every other day. 07/21/12  Yes Luis Abed, MD  aspirin 81 MG tablet Take 81 mg by mouth daily.     Yes Historical Provider, MD  Calcium Carbonate-Vit D-Min 600-400 MG-UNIT TABS Take 1 tablet by mouth daily.     Yes Historical Provider, MD  dimenhyDRINATE (DRAMAMINE) 50 MG tablet Take 50 mg by mouth every 8 (eight) hours as needed (for dizziness).    Yes Historical Provider, MD  donepezil (ARICEPT) 10 MG tablet Take 10 mg by mouth every morning.    Yes Historical Provider, MD  fluticasone (FLONASE) 50 MCG/ACT nasal spray 2 sprays by Nasal route daily.     Yes Historical Provider, MD  folic acid (FOLVITE) 1 MG tablet Take 1 tablet by mouth daily.  10/20/10  Yes Historical Provider, MD  loratadine (CLARITIN) 10 MG tablet 2 hrs before Humira    Yes Historical Provider, MD  losartan (COZAAR)  100 MG tablet Take 1 tablet (100 mg total) by mouth daily. 10/09/12  Yes Pricilla Riffle, MD  Multiple Vitamin (MULTIVITAMIN) tablet Take 1 tablet by mouth daily.     Yes Historical Provider, MD  nitroGLYCERIN (NITROSTAT) 0.4 MG SL tablet Take 1 tablet (0.4 mg total) by mouth as directed. 09/18/11  Yes Rosalio Macadamia, NP  omeprazole (PRILOSEC) 40 MG capsule Take 40 mg by mouth daily.   Yes Historical Provider, MD  predniSONE (DELTASONE) 20 MG tablet Take 20 mg by mouth as needed. 30mg , 30mg ,  two days prior to humira day of humira takes 20mg  10/29/11  Yes Historical Provider, MD  ranitidine (ZANTAC) 150 MG tablet 2 hrs before Humira    Yes Historical Provider, MD  simvastatin (ZOCOR) 40 MG tablet Take 1 tablet by mouth at bedtime.  10/20/10  Yes Historical Provider, MD  warfarin (COUMADIN) 5 MG tablet Take 5 mg by mouth daily. Takes 5mg  Tuesdays, Thursdays, Sundays Takes 2.5mg  Mondays, Wednesdays, Fridays, Saturdays   Yes Historical Provider, MD    Allergies:  Allergies  Allergen Reactions  . Codeine Nausea Only  . Diltiazem Hcl Itching and Rash  . Doxycycline Itching and Rash  . Infliximab Itching and Rash  . Mercaptopurine Itching and Rash  . Mesalamine Itching and Rash  . Methotrexate Itching and Rash  . Metronidazole Itching and Rash  . Pyridostigmine Bromide Nausea Only    History   Social History  . Marital Status: Married    Spouse Name: N/A    Number of Children: N/A  . Years of Education: N/A   Occupational History  . retired    Social History Main Topics  . Smoking status: Former Smoker    Types: Cigarettes    Quit date: 08/12/1961  . Smokeless tobacco: Never Used     Comment: smoked occ in his teenage yrs  . Alcohol Use: No  . Drug Use: No  . Sexual Activity: Not Currently   Other Topics Concern  . Not on file   Social History Narrative  . No narrative on file     Family History  Problem Relation Age of Onset  . Coronary artery disease      Review of  Systems: General: negative for chills, fever, night sweats or weight changes.  Cardiovascular: see HPI Dermatological: negative for rash, groin bruised s/p cath Respiratory: negative for cough or wheezing Urologic: negative for hematuria Abdominal: negative for nausea, vomiting, diarrhea, bright red blood per rectum, melena, or hematemesis Neurologic: +syncope, +tremor All other systems reviewed and are otherwise negative except as noted above.  Labs:   Lab Results  Component Value Date   WBC 5.1 04/01/2013   HGB 15.2 04/01/2013   HCT 42.1 04/01/2013   MCV 88.3 04/01/2013   PLT 319 04/01/2013    Recent Labs Lab 04/01/13 2007  NA 126*  K 4.7  CL 91*  CO2 25  BUN 12  CREATININE 1.09  CALCIUM 9.5  GLUCOSE 91   No results found for this basename: CKTOTAL, CKMB, TROPONINI,  in the last 72 hours No results found for this basename: CHOL, HDL, LDLCALC, TRIG   No results found for this basename: DDIMER    Radiology/Studies:  No results found.   EKG: sinus, LAD, RSR', PRWP  Physical Exam: Blood pressure 165/83, pulse 53, temperature 99.2 F (37.3 C), temperature source Oral, resp. rate 20, weight 93.441 kg (206 lb), SpO2 97.00%. General: Well developed, well nourished, in no acute distress. Head: Normocephalic,  atraumatic, sclera non-icteric, nares are without discharge Neck: Supple. Negative for carotid bruits. JVD not elevated. Lungs: Clear bilaterally to auscultation without wheezes, rales, or rhonchi. Breathing is unlabored. Heart: RRR with S1 S2. No murmurs, rubs, or gallops appreciated. Chest wall- minor bruising at site of ILR Abdomen: Soft, non-tender, non-distended with normoactive bowel sounds. No rebound/guarding. No obvious abdominal masses. Msk:  Strength and tone appear normal for age. Extremities: No edema. No clubbing or cyanosis. Distal pedal pulses are 2+ and equal bilaterally. Hose on. Left groin with mild bruising. Neuro: Alert and oriented X 3. Moves all  extremities spontaneously. Psych:  Responds to questions appropriately. Flat affect.   1. Syncope, with evidence of bradycardia on loop recorder 2. CAD s/p CABG, recent cath 3. HTN 4. 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 H/o afib, on chronic anticoagulation 6. Hyponatremia, subacute 7. Crohn's disease, stable   ASSESSMENT AND PLAN:   72 y.o. male w/ PMHx significant for CAD s/p CABG, Crohn's disease, mild dementia, h/o PE, on chronic anticoagulation who presented to [facility] on 04/01/2013 with complaints of syncopal event x 2 this evening with reportedly bradycardia noted on remotely interrogated loop recorder (strips not available).  Will admit with EP to evaluate in the AM to consider placement of pacermaker. Brief discussion with Dr. Graciela Husbands indicated the possiblity of using Biotronik device with CLS which is an algorithm designed to overdrive pace when sensing the onset of neurally mediated bradycardia. NPO in case device to be placed tomorrow.  As noted in HPI, on several medications that could be potentiating his brady-arrhythmias including the low dose amiodarone and donepazil. Continue these for now as they are important for his other co-morbidities.  Also interesting is the possibility of autonomic instability as raised by his PCP with Shy- Drager syndrome. Further outpt neuro follow up is pending.  On coumadin for h/o afib, therapeutic at 3. Will allow to float down, defer to EP service regarding INR prior to procedure.    Also noted is his hyponatremia. Unclear if this has been worked up before. Will start with TSH and blood osmolality.  Full code On PPI Therapeutic coumadin.  Signed, Adolm Joseph, Troy Sine MD 04/01/2013, 10:28 PM

## 2013-04-01 NOTE — Telephone Encounter (Signed)
Called patient's wife at home ( he was asleep) to advise her to bring him to the [facility] ER because his implanted loop showed bradycardia and he needs to be admitted for a pacemaker insertion per Dr.Klein. She states that he has passed out 2 times today. The second time she called EMS and they wanted to bring him to the hospital but he refused. She will make sure that she gets him to [facility] this evening. [employer] paged but no answer received.

## 2013-04-01 NOTE — ED Notes (Signed)
Pt's family member states that the pt has a internal heart monitor, and the pt's cardiologist wanted the pt to be admitted to the hospital after seeing the reports from the pt's monitor. Pt's family member states that the pt's cardiologist spoke with [employer].

## 2013-04-02 ENCOUNTER — Encounter (HOSPITAL_COMMUNITY): Payer: Self-pay | Admitting: *Deleted

## 2013-04-02 ENCOUNTER — Encounter (HOSPITAL_COMMUNITY)
Admission: EM | Disposition: A | Discharge status: Home / Self Care | Payer: Self-pay | Source: Home / Self Care | Attending: Internal Medicine

## 2013-04-02 DIAGNOSIS — I495 Sick sinus syndrome: Secondary | ICD-10-CM

## 2013-04-02 DIAGNOSIS — R55 Syncope and collapse: Secondary | ICD-10-CM

## 2013-04-02 HISTORY — PX: PERMANENT PACEMAKER INSERTION: SHX5480

## 2013-04-02 LAB — CBC
HCT: 40.2 % (ref 39.0–52.0)
Hemoglobin: 13.9 g/dL (ref 13.0–17.0)
MCH: 30.7 pg (ref 26.0–34.0)
MCHC: 34.6 g/dL (ref 30.0–36.0)
MCV: 88.7 fL (ref 78.0–100.0)
RDW: 14.7 % (ref 11.5–15.5)

## 2013-04-02 LAB — BASIC METABOLIC PANEL
CO2: 24 mEq/L (ref 19–32)
Calcium: 9.1 mg/dL (ref 8.4–10.5)
Chloride: 94 mEq/L — ABNORMAL LOW (ref 96–112)
Creatinine, Ser: 1.06 mg/dL (ref 0.50–1.35)
Glucose, Bld: 85 mg/dL (ref 70–99)

## 2013-04-02 SURGERY — PERMANENT PACEMAKER INSERTION
Anesthesia: LOCAL

## 2013-04-02 MED ORDER — PANTOPRAZOLE SODIUM 40 MG PO TBEC
40.0000 mg | DELAYED_RELEASE_TABLET | Freq: Every day | ORAL | Status: DC
  Administered 2013-04-03: 40 mg via ORAL
  Filled 2013-04-02: qty 1

## 2013-04-02 MED ORDER — SIMVASTATIN 40 MG PO TABS
40.0000 mg | ORAL_TABLET | Freq: Every day | ORAL | Status: DC
  Filled 2013-04-02 (×2): qty 1

## 2013-04-02 MED ORDER — FLUTICASONE PROPIONATE 50 MCG/ACT NA SUSP
2.0000 | Freq: Every day | NASAL | Status: DC
  Administered 2013-04-03: 2 via NASAL
  Filled 2013-04-02 (×2): qty 16

## 2013-04-02 MED ORDER — SODIUM CHLORIDE 0.9 % IJ SOLN
3.0000 mL | Freq: Two times a day (BID) | INTRAMUSCULAR | Status: DC
  Administered 2013-04-02 – 2013-04-03 (×2): 3 mL via INTRAVENOUS

## 2013-04-02 MED ORDER — CEFAZOLIN SODIUM-DEXTROSE 2-3 GM-% IV SOLR
2.0000 g | INTRAVENOUS | Status: AC
  Administered 2013-04-02: 2 g via INTRAVENOUS
  Filled 2013-04-02: qty 50

## 2013-04-02 MED ORDER — CHLORHEXIDINE GLUCONATE 4 % EX LIQD
60.0000 mL | Freq: Once | CUTANEOUS | Status: DC
  Filled 2013-04-02: qty 60

## 2013-04-02 MED ORDER — FENTANYL CITRATE 0.05 MG/ML IJ SOLN
INTRAMUSCULAR | Status: AC
  Filled 2013-04-02: qty 2

## 2013-04-02 MED ORDER — LOSARTAN POTASSIUM 50 MG PO TABS
100.0000 mg | ORAL_TABLET | Freq: Every day | ORAL | Status: DC
  Administered 2013-04-02 – 2013-04-03 (×2): 100 mg via ORAL
  Filled 2013-04-02 (×2): qty 2

## 2013-04-02 MED ORDER — AMIODARONE HCL 100 MG PO TABS
100.0000 mg | ORAL_TABLET | Freq: Every day | ORAL | Status: DC
  Administered 2013-04-02 – 2013-04-03 (×2): 100 mg via ORAL
  Filled 2013-04-02 (×2): qty 1

## 2013-04-02 MED ORDER — LIDOCAINE HCL (PF) 1 % IJ SOLN
INTRAMUSCULAR | Status: AC
  Filled 2013-04-02: qty 60

## 2013-04-02 MED ORDER — ONDANSETRON HCL 4 MG/2ML IJ SOLN: 4.0000 mg | Freq: Four times a day (QID) | INTRAMUSCULAR | Status: DC | PRN

## 2013-04-02 MED ORDER — CEFAZOLIN SODIUM-DEXTROSE 2-3 GM-% IV SOLR
2.0000 g | INTRAVENOUS | Status: DC
  Filled 2013-04-02: qty 50

## 2013-04-02 MED ORDER — SODIUM CHLORIDE 0.9 % IV SOLN: INTRAVENOUS | Status: DC

## 2013-04-02 MED ORDER — ALPRAZOLAM 0.25 MG PO TABS: 0.5000 mg | ORAL_TABLET | Freq: Every evening | ORAL | Status: DC | PRN

## 2013-04-02 MED ORDER — ADULT MULTIVITAMIN W/MINERALS CH
1.0000 | ORAL_TABLET | Freq: Every day | ORAL | Status: DC
  Administered 2013-04-03: 1 via ORAL
  Filled 2013-04-02 (×2): qty 1

## 2013-04-02 MED ORDER — ACETAMINOPHEN 325 MG PO TABS
650.0000 mg | ORAL_TABLET | ORAL | Status: DC | PRN
  Administered 2013-04-02: 650 mg via ORAL
  Filled 2013-04-02 (×2): qty 2

## 2013-04-02 MED ORDER — MIDAZOLAM HCL 5 MG/5ML IJ SOLN
INTRAMUSCULAR | Status: AC
  Filled 2013-04-02: qty 5

## 2013-04-02 MED ORDER — SODIUM CHLORIDE 0.9 % IR SOLN
80.0000 mg | Status: DC
  Filled 2013-04-02: qty 2

## 2013-04-02 MED ORDER — CEFAZOLIN SODIUM-DEXTROSE 2-3 GM-% IV SOLR
2.0000 g | Freq: Four times a day (QID) | INTRAVENOUS | Status: AC
  Administered 2013-04-02 – 2013-04-03 (×3): 2 g via INTRAVENOUS
  Filled 2013-04-02 (×3): qty 50

## 2013-04-02 MED ORDER — ACETAMINOPHEN 325 MG PO TABS
325.0000 mg | ORAL_TABLET | ORAL | Status: DC | PRN
  Administered 2013-04-02: 325 mg via ORAL
  Administered 2013-04-03: 650 mg via ORAL
  Filled 2013-04-02: qty 2

## 2013-04-02 MED ORDER — NITROGLYCERIN 0.4 MG SL SUBL: 0.4000 mg | SUBLINGUAL_TABLET | SUBLINGUAL | Status: DC | PRN

## 2013-04-02 MED ORDER — CHLORHEXIDINE GLUCONATE 4 % EX LIQD
60.0000 mL | Freq: Once | CUTANEOUS | Status: DC
  Administered 2013-04-02: 4 via TOPICAL
  Filled 2013-04-02: qty 60

## 2013-04-02 MED ORDER — SODIUM CHLORIDE 0.9 % IR SOLN
80.0000 mg | Status: AC
  Administered 2013-04-02: 80 mg
  Filled 2013-04-02: qty 2

## 2013-04-02 MED ORDER — ASPIRIN EC 81 MG PO TBEC
81.0000 mg | DELAYED_RELEASE_TABLET | Freq: Every day | ORAL | Status: DC
  Administered 2013-04-02 – 2013-04-03 (×2): 81 mg via ORAL
  Filled 2013-04-02 (×2): qty 1

## 2013-04-02 MED ORDER — ATORVASTATIN CALCIUM 20 MG PO TABS
20.0000 mg | ORAL_TABLET | Freq: Every day | ORAL | Status: DC
  Administered 2013-04-02 – 2013-04-03 (×2): 20 mg via ORAL
  Filled 2013-04-02 (×2): qty 1

## 2013-04-02 MED ORDER — ASPIRIN 81 MG PO TABS: 81.0000 mg | ORAL_TABLET | Freq: Every day | ORAL | Status: DC

## 2013-04-02 MED ORDER — ONE-DAILY MULTI VITAMINS PO TABS: 1.0000 | ORAL_TABLET | Freq: Every day | ORAL | Status: DC

## 2013-04-02 MED ORDER — DONEPEZIL HCL 10 MG PO TABS
10.0000 mg | ORAL_TABLET | Freq: Every morning | ORAL | Status: DC
  Administered 2013-04-02 – 2013-04-03 (×2): 10 mg via ORAL
  Filled 2013-04-02 (×2): qty 1

## 2013-04-02 NOTE — CV Procedure (Signed)
DDD PPM insertion via the left subclavian vein without immediate complication. [id number].

## 2013-04-02 NOTE — Op Note (Signed)
[name] [name], [name] [name] NO.:  [id number]  MEDICAL RECORD NO.:  [id number]  LOCATION:  [id number]                        FACILITY:  [facility]  PHYSICIAN:  Doylene Canning. Ladona Ridgel, MD    DATE OF BIRTH:  19-May-1941  DATE OF PROCEDURE:  04/02/2013 DATE OF DISCHARGE:                              OPERATIVE REPORT   PROCEDURE PERFORMED:  Insertion of a dual-chamber pacemaker.  INDICATION:  Symptomatic bradycardia secondary to sinus node dysfunction, and due to documented autonomic dysfunction causing bradycardia and subsequent syncope.  INTRODUCTION:  The patient is a 72 year old male with recurrent history of unexplained syncope, who underwent insertion of an implantable loop recorder.  He was subsequently found to have symptomatic sinus node dysfunction and severe bradycardia associated with symptoms.  There was no reversible causes and he is now referred for insertion of a permanent dual-chamber pacemaker.  DESCRIPTION OF PROCEDURE:  After informed consent was obtained, the patient was taken to the diagnostic EP lab in a fasting state.  After usual preparation and draping, intravenous fentanyl and midazolam was given for sedation.  A 30 mL of lidocaine was infiltrated into the left infraclavicular region.  A 5-cm incision was carried out over this region.  Electrocautery was utilized to dissect down the fascial plane. The left subclavian vein was then punctured x2 and the [employer], serial [id number] right ventricular pacing lead was advanced to the right ventricle and the Biotronik Setrox S53 bipolar pacing lead, serial #[id number] was advanced to the right atrium.  Mapping was first carried out in the right ventricle, and at the final site, the R-waves were 12, the impedance was 800 with the lead actively fixed and threshold 0.5 V at 0.4 milliseconds.  A 10 V pacing did not stimulate the diaphragm and there was large injury current with active fixation of the lead.   With the ventricular lead in satisfactory position, attention was then turned to placement of the atrial lead was placed in anterolateral portion of the right atrium where P-waves measured 1.8 mV.  The pacing impedance with the lead actively fixed was 530 ohms and threshold 1.1 V at 0.4 milliseconds.  Again, 10 V pacing did not stimulate the diaphragm.  With these satisfactory parameters, the leads were secured to the subpectoral fascia with a figure-of-eight silk suture and the sewing sleeves were secured with silk suture.  Electrocautery was utilized to make a subcutaneous pocket.  Antibiotic irrigation was utilized to irrigate the pocket.  Electrocautery was utilized to assure hemostasis.  The Biotronik Evia DR dual-chamber pacemaker, serial [id number] was connected to the atrial and RV leads and placed back in the subcutaneous pocket. The pocket was irrigated with antibiotic irrigation.  The incision was closed with 2-0 and 3-0 Vicryl.  Benzoin and Steri-Strips were painted on the skin.  A pressure dressing was applied.  The patient was returned to his room in satisfactory condition.  COMPLICATIONS:  There were no immediate procedure complications.  RESULTS:  This demonstrate successful implantation of a Biotronik dual- chamber pacemaker in a patient with symptomatic bradycardia.  Closed loop stimulation was enabled.     Doylene Canning. Ladona Ridgel, MD  GWT/MEDQ  D:  04/02/2013  T:  04/02/2013  Job:  [id number]  cc:   Duke Salvia, MD, [facility]

## 2013-04-02 NOTE — Interval H&P Note (Signed)
History and Physical Interval Note:  04/02/2013 11:24 AM  [name] [name]  has presented today for surgery, with the diagnosis of bradicardia  The various methods of treatment have been discussed with the patient and family. After consideration of risks, benefits and other options for treatment, the patient has consented to  Procedure(s): PERMANENT PACEMAKER INSERTION (N/A) as a surgical intervention .  The patient's history has been reviewed, patient examined, no change in status, stable for surgery.  I have reviewed the patient's chart and labs.  Questions were answered to the patient's satisfaction.     Lewayne Bunting

## 2013-04-02 NOTE — Care Management Note (Unsigned)
    Page 1 of 1   04/02/2013     1:02:57 PM   CARE MANAGEMENT NOTE 04/02/2013  Patient:  [name] [name], [name] [name]   Account Number:  [id number]  Date Initiated:  04/02/2013  Documentation initiated by:  Harlem Bula  Subjective/Objective Assessment:   PT ADM WITH SYNCOPE; PPM PLACEMENT TODAY.  PTA, PT INDEPENDENT, LIVES WITH SPOUSE.     Action/Plan:   WILL FOLLOW FOR HOME NEEDS AS PT PROGRESSES.   Anticipated DC Date:  04/03/2013   Anticipated DC Plan:  HOME/SELF CARE      DC Planning Services  CM consult      Choice offered to / List presented to:             Status of service:  In process, will continue to follow Medicare Important Message given?   (If response is "NO", the following Medicare IM given date fields will be blank) Date Medicare IM given:   Date Additional Medicare IM given:    Discharge Disposition:    Per UR Regulation:  Reviewed for med. necessity/level of care/duration of stay  If discussed at Long Length of Stay Meetings, dates discussed:    Comments:

## 2013-04-02 NOTE — Progress Notes (Signed)
   ELECTROPHYSIOLOGY ROUNDING NOTE    Patient Name: [name] [name] Date of Encounter: 04/02/2013    SUBJECTIVE:Patient feels well this morning.  No chest pain, shortness of breath, or dizziness.  Admitted yesterday evening after recurrent syncopal spells.  Interrogation of loop recorder demonstrated bradycardia which correlated with his symptoms.    TELEMETRY: Reviewed telemetry pt in sinus rhythm Filed Vitals:   04/01/13 2315 04/02/13 0024 04/02/13 0100 04/02/13 0429  BP: 167/81 171/87  168/88  Pulse: 57 56  59  Temp:  98.5 F (36.9 C)  99.2 F (37.3 C)  TempSrc:  Oral  Oral  Resp: 17 18  18   Height:   5\' 10"  (1.778 m)   Weight:  203 lb 11.3 oz (92.4 kg) 203 lb 11.3 oz (92.4 kg)   SpO2: 97% 95%  94%   No intake or output data in the 24 hours ending 04/02/13 0753  CURRENT MEDICATIONS: . amiodarone  100 mg Oral Daily  . aspirin EC  81 mg Oral Daily  . donepezil  10 mg Oral q morning - 10a  . fluticasone  2 spray Each Nare Daily  . losartan  100 mg Oral Daily  . multivitamin with minerals  1 tablet Oral Daily  . pantoprazole  40 mg Oral Daily  . simvastatin  40 mg Oral QHS  . sodium chloride  3 mL Intravenous Q12H    LABS: Basic Metabolic Panel:  Recent Labs  40/98/11 2007 04/02/13 0445  NA 126* 127*  K 4.7 4.3  CL 91* 94*  CO2 25 24  GLUCOSE 91 85  BUN 12 11  CREATININE 1.09 1.06  CALCIUM 9.5 9.1  CBC:  Recent Labs  04/01/13 2007 04/02/13 0445  WBC 5.1 5.4  HGB 15.2 13.9  HCT 42.1 40.2  MCV 88.3 88.7  PLT 319 319   INR: 2.86    PHYSICAL EXAM Well appearing 72 yo man, NAD HEENT: Unremarkable Neck: 6 cm JVD, no thyromegally Back:  No CVA tenderness Lungs:  Clear with no wheezes HEART:  Regular rate rhythm, no murmurs, no rubs, no clicks Abd:  obese, positive bowel sounds, no organomegally, no rebound, no guarding Ext:  2 plus pulses, no edema, no cyanosis, no clubbing Skin:  No rashes no nodules, ecchymosis over ILR insertion and right  groin. Neuro:  CN II through XII intact, motor grossly intact  A/P 1. Syncope 2. Symptomatic sinus bradycardia 3. Obesity 4. CAD Rec: I have discussed the treatment options with the patient. The risks/benefits/goals/expectations of PPM insertion and removal of his ILR have been discussed with the patient and he wishes to proceed.  [name].D.

## 2013-04-02 NOTE — Progress Notes (Signed)
Utilization Review Completed.[name] [name] T8/22/2014

## 2013-04-02 NOTE — H&P (View-Only) (Signed)
   ELECTROPHYSIOLOGY ROUNDING NOTE    Patient Name: [name] [name] Date of Encounter: 04/02/2013    SUBJECTIVE:Patient feels well this morning.  No chest pain, shortness of breath, or dizziness.  Admitted yesterday evening after recurrent syncopal spells.  Interrogation of loop recorder demonstrated bradycardia which correlated with his symptoms.    TELEMETRY: Reviewed telemetry pt in sinus rhythm Filed Vitals:   04/01/13 2315 04/02/13 0024 04/02/13 0100 04/02/13 0429  BP: 167/81 171/87  168/88  Pulse: 57 56  59  Temp:  98.5 F (36.9 C)  99.2 F (37.3 C)  TempSrc:  Oral  Oral  Resp: 17 18  18  Height:   5' 10" (1.778 m)   Weight:  203 lb 11.3 oz (92.4 kg) 203 lb 11.3 oz (92.4 kg)   SpO2: 97% 95%  94%   No intake or output data in the 24 hours ending 04/02/13 0753  CURRENT MEDICATIONS: . amiodarone  100 mg Oral Daily  . aspirin EC  81 mg Oral Daily  . donepezil  10 mg Oral q morning - 10a  . fluticasone  2 spray Each Nare Daily  . losartan  100 mg Oral Daily  . multivitamin with minerals  1 tablet Oral Daily  . pantoprazole  40 mg Oral Daily  . simvastatin  40 mg Oral QHS  . sodium chloride  3 mL Intravenous Q12H    LABS: Basic Metabolic Panel:  Recent Labs  04/01/13 2007 04/02/13 0445  NA 126* 127*  K 4.7 4.3  CL 91* 94*  CO2 25 24  GLUCOSE 91 85  BUN 12 11  CREATININE 1.09 1.06  CALCIUM 9.5 9.1  CBC:  Recent Labs  04/01/13 2007 04/02/13 0445  WBC 5.1 5.4  HGB 15.2 13.9  HCT 42.1 40.2  MCV 88.3 88.7  PLT 319 319   INR: 2.86    PHYSICAL EXAM Well appearing 72 yo man, NAD HEENT: Unremarkable Neck: 6 cm JVD, no thyromegally Back:  No CVA tenderness Lungs:  Clear with no wheezes HEART:  Regular rate rhythm, no murmurs, no rubs, no clicks Abd:  obese, positive bowel sounds, no organomegally, no rebound, no guarding Ext:  2 plus pulses, no edema, no cyanosis, no clubbing Skin:  No rashes no nodules, ecchymosis over ILR insertion and right  groin. Neuro:  CN II through XII intact, motor grossly intact  A/P 1. Syncope 2. Symptomatic sinus bradycardia 3. Obesity 4. CAD Rec: I have discussed the treatment options with the patient. The risks/benefits/goals/expectations of PPM insertion and removal of his ILR have been discussed with the patient and he wishes to proceed.  Gregg Taylor,[name]     

## 2013-04-03 ENCOUNTER — Other Ambulatory Visit: Payer: Self-pay | Admitting: Cardiology

## 2013-04-03 ENCOUNTER — Inpatient Hospital Stay (HOSPITAL_COMMUNITY): Payer: Medicare Other

## 2013-04-03 DIAGNOSIS — I2510 Atherosclerotic heart disease of native coronary artery without angina pectoris: Secondary | ICD-10-CM

## 2013-04-03 DIAGNOSIS — I10 Essential (primary) hypertension: Secondary | ICD-10-CM

## 2013-04-03 MED ORDER — YOU HAVE A PACEMAKER BOOK
Freq: Once | Status: AC
  Administered 2013-04-03: 02:00:00
  Filled 2013-04-03: qty 1

## 2013-04-03 NOTE — Progress Notes (Signed)
Pacemaker video being watched by patient and wife.  Pt. Will need reinforcement due to Alzheimer's dementia, though wife, through teach-back method, appears to comprehend material.

## 2013-04-03 NOTE — Progress Notes (Signed)
SUBJECTIVE: [name] [name] is doing well after pacemaker placement for symptomatic bradycardia and syncope due to sinus node dysfunction. Denies chest pain and shortness of breath.       Past Medical History   Diagnosis  Date   .  Syncope and collapse      Evaluation by Dr Turner Daniels; Syncope probably neutrally mediated and modest resting sinus bradycardia. and 16 B. episode of SVT that was either A fib or another type of SVT. Patient improved with combination of holding ACE inhibitor or low BP and reducing beta blocker for bradycardia   .  Hypertension        .  Coronary artery disease      relook cath 08/12/08 - all 5 grafts are patent   .  Crohn's disease      Humara Rx in past   .  SVT (supraventricular tachycardia)    .  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  History of benign colon tumor    .  Sleep apnea      questionable   .  Mitral regurgitation      mild - echo 4/09   .  Anxiety    .  Gait difficulty      with Crestor (but tolerates simva)   .  Pulmonary embolism      2007,b CT scan, IVC fiter placed then because of concern about coumadin use at that time.   .  Orthostasis      treated   .  Drug therapy      Intermittant steroids   .  S/P IVC filter      2007 PE   .  Warfarin anticoagulation    .  Factor VIII      Factor VIII EXCESS.Marland KitchenMarland KitchenDr Hampton Abbot.Marland Kitchenanother reason for coumadin   .  Hx of CABG      2008   .  Incomplete RBBB    .  Drug therapy      question rash diltiazem, and amio   .  Ejection fraction      EF 60-65%, echo, March, 2012, moderate diastolic dysfunction   .  Elevated diaphragm      seen by Dr. Sherene Sires; has chronic shortness of breath   .  Atrial fibrillation      April, 2013, new diagnosis   .  Bradycardia      Sinus bradycardia, asymptomatic, April, 2013   .  Carotid artery disease      Dopplers to be checked, June, 2013   .  Cough      October, 2013   .  Tremor      October, 2013   Surgical History:  Past Surgical History   Procedure   Laterality  Date   .  Coronary artery bypass graft   4/08   .  Cholecystectomy     .  Back surgery       x2   .  Cataract extraction       x2   .  Colonoscopy w/ polypectomy         Filed Vitals:   04/02/13 1330 04/02/13 1345 04/02/13 2122 04/03/13 0513  BP: 150/86 147/71 159/86 136/74  Pulse: 61 55 68 66  Temp:   98.6 F (37 C) 98.7 F (37.1 C)  TempSrc:   Oral Oral  Resp: 18 18 18 18   Height:      Weight:   203 lb 7.8  oz (92.3 kg) 203 lb 7.8 oz (92.3 kg)  SpO2: 99%  96% 93%    Intake/Output Summary (Last 24 hours) at 04/03/13 0916 Last data filed at 04/03/13 6578  Gross per 24 hour  Intake    700 ml  Output    776 ml  Net    -76 ml    PHYSICAL EXAM General: NAD, flat affect Neck: No JVD, no thyromegaly or thyroid nodule.  Lungs: Clear to auscultation bilaterally with normal respiratory effort. CV: Nondisplaced PMI.  Heart regular S1/S2, no S3/S4, no murmur. Dressing in place, without significant hematoma. No peripheral edema.  No carotid bruit.  Normal pedal pulses.  Abdomen: Soft, nontender, no hepatosplenomegaly, no distention.  Neurologic: Alert and oriented x 3.  Psych: Flat affect. Extremities: No clubbing or cyanosis.     LABS: Basic Metabolic Panel:  Recent Labs  46/96/29 2007 04/02/13 0445  NA 126* 127*  K 4.7 4.3  CL 91* 94*  CO2 25 24  GLUCOSE 91 85  BUN 12 11  CREATININE 1.09 1.06  CALCIUM 9.5 9.1   Liver Function Tests: No results found for this basename: AST, ALT, ALKPHOS, BILITOT, PROT, ALBUMIN,  in the last 72 hours No results found for this basename: LIPASE, AMYLASE,  in the last 72 hours CBC:  Recent Labs  04/01/13 2007 04/02/13 0445  WBC 5.1 5.4  HGB 15.2 13.9  HCT 42.1 40.2  MCV 88.3 88.7  PLT 319 319   Cardiac Enzymes: No results found for this basename: CKTOTAL, CKMB, CKMBINDEX, TROPONINI,  in the last 72 hours BNP: No components found with this basename: POCBNP,  D-Dimer: No results found for this basename:  DDIMER,  in the last 72 hours Hemoglobin A1C: No results found for this basename: HGBA1C,  in the last 72 hours Fasting Lipid Panel: No results found for this basename: CHOL, HDL, LDLCALC, TRIG, CHOLHDL, LDLDIRECT,  in the last 72 hours Thyroid Function Tests:  Recent Labs  04/02/13 1010  TSH 2.717   Anemia Panel: No results found for this basename: VITAMINB12, FOLATE, FERRITIN, TIBC, IRON, RETICCTPCT,  in the last 72 hours  RADIOLOGY: Dg Chest 2 View  04/03/2013   *RADIOLOGY REPORT*  Clinical Data: Pacemaker implantation  CHEST - 2 VIEW  Comparison: 06/02/2012  Findings: Interval placement of a left subclavian dual lead pacemaker, leads extending towards the right atrium and right ventricular apex.  No pneumothorax.  Previous CABG.  Mild cardiomegaly.  Surgical clips in the right upper abdomen.  Mild elevation of the right diaphragmatic leaflet as before.  Patchy subsegmental atelectasis or scarring at the left lung base as before.  No effusion.  IMPRESSION:  Pacemaker placement as above without pneumothorax.   Original Report Authenticated By: D. Andria Rhein, MD      ASSESSMENT AND PLAN: 1. Sinus node dysfunction s/p pacemaker placement on 04/02/13 by Dr. Ladona Ridgel: stable, no pneumothorax.  2. H/o PE s/p IVC filter, on chronic Warfarin therapy: as per Dr. Ladona Ridgel, Warfarin can be restarted on 04/04/13 with INR check on 8/25. 3. CAD s/p CABG: symptomatically stable. No changes in medication regimen. 4. HTN: normotensive today. 5. Disposition: stable for discharge today.   Prentice Docker, [name], F.A.C.C.

## 2013-04-03 NOTE — Discharge Summary (Addendum)
Physician Discharge Summary  Patient ID: [name] [name] MRN: [id number] DOB/AGE: [birth year] 73 y.o.  Admit date: 04/01/2013 Discharge date: 04/03/2013  Primary Cardiologist: Julien Nordmann, MD Primary Electrophysiologist: Sharrell Ku, MD   Primary Discharge Diagnosis: 1 Symptomatic bradycardia   - secondary to sinus node dysfunction  - documented autonomic dysfunction resulting in bradycardia, subsequent syncope  - s/p pacemaker placement on 04/02/13, by Dr. Sharrell Ku   Secondary Discharge Diagnoses: Past Medical History  Diagnosis Date  . Syncope and collapse     Evaluation by Dr Turner Daniels; Syncope probably neutrally mediated and modest resting sinus bradycardia. and 16 B. episode of SVT that was either A fib or another type of SVT. Patient improved with combination of holding ACE inhibitor or low BP and reducing beta blocker for bradycardia  . Hypertension       . Coronary artery disease     relook cath 08/12/08 - all 5 grafts are patent  . Crohn's disease     Humara Rx in past  . SVT (supraventricular tachycardia)   . 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 History of benign colon tumor   . Sleep apnea     questionable  . Mitral regurgitation     mild - echo 4/09  . Anxiety   . Gait difficulty     with Crestor (but tolerates simva)  . Pulmonary embolism     2007,b CT scan, IVC fiter placed then because of concern about coumadin use at that time.  . Orthostasis     treated  . Drug therapy     Intermittant steroids   . S/P IVC filter     2007  PE  . Warfarin anticoagulation   . Factor VIII     Factor VIII EXCESS.Marland KitchenMarland KitchenDr Hampton Abbot.Marland Kitchenanother reason for coumadin  . Hx of CABG     2008  . Incomplete RBBB   . Drug therapy     question rash diltiazem, and amio  . Ejection fraction     EF 60-65%, echo, March, 2012, moderate diastolic dysfunction  . 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 Sherene Sires; has chronic shortness of breath  . Atrial fibrillation     April, 2013, new diagnosis  .  Bradycardia     Sinus bradycardia, asymptomatic, April, 2013  . Carotid artery disease     Dopplers to be checked, June, 2013  . Cough     October, 2013  . Tremor     October, 2013    Reason for Admission: 72 year old male with recurrent history of unexplained syncope, who underwent insertion of an implantable loop recorder, subsequently found to have symptomatic sinus node dysfunction and severe bradycardia associated with symptoms. There were no reversible causes, now referred for insertion of a permanent dual-chamber pacemaker.   Procedure: Biotronik Evia DR dual-chamber pacemaker implantation, serial [id number], 04/02/2013.   Hospital Course: Patient underwent successful implantation of a Biotronik dual-chamber PPM, performed by Dr. Sharrell Ku, with no immediate complications. He was cleared for discharge the following morning in hemodynamically stable condition. Followup CXR negative for PTX.  Final recommendations at time of discharge, per Dr., Purvis Sheffield, were for patient to resume Coumadin the following day, August 24, with a followup INR check on Monday, August 25.   Discharge Vitals: Blood pressure 136/74, pulse 66, temperature 98.7 F (37.1 C), temperature source Oral, resp. rate 18, height 5\' 10"  (1.778 m), weight 203 lb 7.8 oz (92.3 kg), SpO2 93.00%.  Labs: Lab Results  Component Value Date   WBC 5.4 04/02/2013   HGB 13.9 04/02/2013   HCT 40.2 04/02/2013   MCV 88.7 04/02/2013   PLT 319 04/02/2013      Recent Labs Lab 04/02/13 0445  NA 127*  K 4.3  CL 94*  CO2 24  BUN 11  CREATININE 1.06  CALCIUM 9.1  GLUCOSE 85    No results found for this basename: CHOL,  HDL,  LDLCALC,  TRIG    No results found for this basename: DDIMER    Lab Results  Component Value Date   TSH 2.717 04/02/2013    No results found for this basename: CKTOTAL, CKMB, TROPONINI,  in the last 72 hours  Diagnostic Studies: Dg Chest 2 View  04/03/2013   *RADIOLOGY REPORT*  Clinical  Data: Pacemaker implantation  CHEST - 2 VIEW  Comparison: 06/02/2012  Findings: Interval placement of a left subclavian dual lead pacemaker, leads extending towards the right atrium and right ventricular apex.  No pneumothorax.  Previous CABG.  Mild cardiomegaly.  Surgical clips in the right upper abdomen.  Mild elevation of the right diaphragmatic leaflet as before.  Patchy subsegmental atelectasis or scarring at the left lung base as before.  No effusion.  IMPRESSION:  Pacemaker placement as above without pneumothorax.   Original Report Authenticated By: D. Andria Rhein, MD     DISPOSITION: Stable condition  FOLLOW UP PLANS AND APPOINTMENTS: Discharge Orders   Future Appointments Provider Department Dept Phone   04/05/2013 12:30 PM [facility] [address] Main Office [place]) [phone]   04/15/2013 8:45 AM Antonieta Iba, MD [facility] [place] [phone]   04/15/2013 9:00 AM [facility] [place] [phone]   Future Orders Complete By Expires   Diet - low sodium heart healthy  As directed    Increase activity slowly  As directed          Follow-up Information   Follow up with [facility] in 2 weeks. (Office to call and arrange appointments)       Follow up with Lewayne Bunting, MD In 3 months.   Specialty:  Cardiology   Contact information:   [address] [place] [address] [phone]       DISCHARGE MEDICATIONS:   Medication List         acetaminophen 325 MG tablet  Commonly known as:  TYLENOL  Take 650 mg by mouth every 6 (six) hours as needed for pain.     ALPRAZolam 0.5 MG tablet  Commonly known as:  XANAX  Take 0.5 mg by mouth at bedtime as needed for sleep.     amiodarone 200 MG tablet  Commonly known as:  PACERONE  Take 200mg  (1 tab) alternating with 100mg  (1/2 tab) every other day.     aspirin 81 MG tablet  Take 81 mg by mouth daily.     Calcium Carbonate-Vit  D-Min 600-400 MG-UNIT Tabs  Take 1 tablet by mouth daily.     dimenhyDRINATE 50 MG tablet  Commonly known as:  DRAMAMINE  Take 50 mg by mouth every 8 (eight) hours as needed (for dizziness).     donepezil 10 MG tablet  Commonly known as:  ARICEPT  Take 10 mg by mouth every morning.     fluticasone 50 MCG/ACT nasal spray  Commonly known as:  FLONASE  2 sprays by Nasal route daily.     folic acid 1 MG tablet  Commonly known as:  FOLVITE  Take 1 tablet by mouth daily.     HUMIRA PEN 40 MG/0.8ML injection  Generic drug:  adalimumab  Inject 40 mg into the skin.     loratadine 10 MG tablet  Commonly known as:  CLARITIN  2 hrs before Humira     losartan 100 MG tablet  Commonly known as:  COZAAR  Take 1 tablet (100 mg total) by mouth daily.     multivitamin tablet  Take 1 tablet by mouth daily.     nitroGLYCERIN 0.4 MG SL tablet  Commonly known as:  NITROSTAT  Take 1 tablet (0.4 mg total) by mouth as directed.     omeprazole 40 MG capsule  Commonly known as:  PRILOSEC  Take 40 mg by mouth daily.     predniSONE 20 MG tablet  Commonly known as:  DELTASONE  Take 20 mg by mouth as needed. 30mg , 30mg ,  two days prior to humira day of humira takes 20mg      simvastatin 40 MG tablet  Commonly known as:  ZOCOR  Take 1 tablet by mouth at bedtime.     warfarin 5 MG tablet  Commonly known as:  COUMADIN  - Take 5 mg by mouth daily. Takes 5mg  Tuesdays, Thursdays, Sundays  - Takes 2.5mg  Mondays, Wednesdays, Fridays, Saturdays     ZANTAC 150 MG tablet  Generic drug:  ranitidine  2 hrs before Humira        BRING ALL MEDICATIONS WITH YOU TO FOLLOW UP APPOINTMENTS  Time spent with patient to include physician time: Greater than 30 minutes, including physician time.  SignedPrescott Parma 04/03/2013, 10:55 AM Co-Sign MD

## 2013-04-05 ENCOUNTER — Ambulatory Visit (INDEPENDENT_AMBULATORY_CARE_PROVIDER_SITE_OTHER): Payer: Medicare Other | Admitting: *Deleted

## 2013-04-05 DIAGNOSIS — Z950 Presence of cardiac pacemaker: Secondary | ICD-10-CM

## 2013-04-05 DIAGNOSIS — I498 Other specified cardiac arrhythmias: Secondary | ICD-10-CM

## 2013-04-05 DIAGNOSIS — I4891 Unspecified atrial fibrillation: Secondary | ICD-10-CM

## 2013-04-05 DIAGNOSIS — R001 Bradycardia, unspecified: Secondary | ICD-10-CM

## 2013-04-05 LAB — PACEMAKER DEVICE OBSERVATION
AL IMPEDENCE PM: 546 Ohm
AL THRESHOLD: 0.9 V
BAMS-0001: 160 {beats}/min
RV LEAD AMPLITUDE: 7.3 mv
VENTRICULAR PACING PM: 9

## 2013-04-05 NOTE — Progress Notes (Signed)
Pt had wound check appt today but device implanted on 04/02/13. Wound was not checked due to premature appt. Device checked by industry, all functions normal, no changes made, full details in PaceArt.  Surface leads necessary due to Atrial standstill per industry.  Add'l appt made for pt for wound check only in device clinic on 04/13/13.

## 2013-04-09 ENCOUNTER — Encounter: Payer: Medicare Other | Admitting: Internal Medicine

## 2013-04-13 ENCOUNTER — Ambulatory Visit (INDEPENDENT_AMBULATORY_CARE_PROVIDER_SITE_OTHER): Payer: Medicare Other | Admitting: *Deleted

## 2013-04-13 DIAGNOSIS — Z950 Presence of cardiac pacemaker: Secondary | ICD-10-CM

## 2013-04-13 NOTE — Progress Notes (Signed)
Pt seen in device clinic for follow up of recently implanted pacemaker.  Wound well healed.  No redness, swelling, or edema.  Mild bruising. Mild skin irritation to steri-strips but no allergy. Steri-strips removed today.   Device interrogated 04/05/13, See PaceArt for full details.  Pt to follow up with Dr. Ladona Ridgel 07/13/13.   Lugenia Assefa 04/13/2013 5:35 PM

## 2013-04-14 LAB — PACEMAKER DEVICE OBSERVATION: DEVICE MODEL PM: 68066723

## 2013-04-15 ENCOUNTER — Encounter: Payer: Self-pay | Admitting: Cardiovascular Disease

## 2013-04-15 ENCOUNTER — Ambulatory Visit (INDEPENDENT_AMBULATORY_CARE_PROVIDER_SITE_OTHER): Payer: Medicare Other

## 2013-04-15 ENCOUNTER — Ambulatory Visit (INDEPENDENT_AMBULATORY_CARE_PROVIDER_SITE_OTHER): Payer: Medicare Other | Admitting: Cardiovascular Disease

## 2013-04-15 VITALS — BP 122/72 | HR 69 | Ht 70.0 in | Wt 204.5 lb

## 2013-04-15 DIAGNOSIS — R55 Syncope and collapse: Secondary | ICD-10-CM

## 2013-04-15 DIAGNOSIS — R0989 Other specified symptoms and signs involving the circulatory and respiratory systems: Secondary | ICD-10-CM

## 2013-04-15 DIAGNOSIS — I2699 Other pulmonary embolism without acute cor pulmonale: Secondary | ICD-10-CM

## 2013-04-15 DIAGNOSIS — I2510 Atherosclerotic heart disease of native coronary artery without angina pectoris: Secondary | ICD-10-CM

## 2013-04-15 DIAGNOSIS — I4891 Unspecified atrial fibrillation: Secondary | ICD-10-CM

## 2013-04-15 DIAGNOSIS — Z7901 Long term (current) use of anticoagulants: Secondary | ICD-10-CM

## 2013-04-15 DIAGNOSIS — D66 Hereditary factor VIII deficiency: Secondary | ICD-10-CM

## 2013-04-15 LAB — POCT INR: INR: 3.7

## 2013-04-15 NOTE — Patient Instructions (Addendum)
You are doing well. No medication changes were made.  Monitor your blood pressure If blood pressure runs low, call the office  Please call us if you have new issues that need to be addressed before your next appt.  Your physician wants you to follow-up in: 6 months.  You will receive a reminder letter in the mail two months in advance. If you don't receive a letter, please call our office to schedule the follow-up appointment.

## 2013-04-15 NOTE — Assessment & Plan Note (Signed)
We have suggested for now that he continue on his amiodarone.

## 2013-04-15 NOTE — Assessment & Plan Note (Signed)
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  Pacemaker placement August 2014

## 2013-04-15 NOTE — Progress Notes (Signed)
Patient ID: [name] [name], male    DOB: [birth year], 72 y.o.   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 Recent workup August 2014 for continued episodes of syncope found to have sick sinus syndrome, loop monitor placed with continued episodes of syncope, pacemaker placed at [facility] August 2014.   In followup today, pacemaker site is healing well. Wife reports that his appetite is poor, he did not do well in the hospital with some confusion. He is doing slightly better at home. Still not back to his baseline.   Recent cardiac catheterization 03/23/2013 showing patent grafts x5, LIMA to the LAD, vein graft to the diagonal, vein graft to the RCA, vein graft to the OM1 jump graft to the OM 2, ejection fraction 50%  Previous H/o atrial fibrillation on amiodarone, h/o  neurocardiogenic syncope,  MRI that showed no acute abnormality. some bradycardia.  on amiodarone, weaned off metoprolol Previous episodes of syncope in the past felt to be neurocardiogenic.  episode of syncope  at a restaurant  waiting in line for food. He had nausea, lightheadedness, then had syncope. He is on the ground for several minutes before he came to. Workup in the hospital showed sodium of 122, normal echocardiogram, normal telemetry with normal sinus rhythm. It was felt that he could be orthostatic from the start HCTZ earlier that week with a change in his sodium.    Myoview was performed that showed no ischemia. 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 Had MRI that by report showed atrophy Carotid ultrasound June 2013 showing mild carotid arterial disease  EKG today shows normal sinus rhythm with rate 69 beats per minute with left anterior fascicular block, ST and T wave abnormality, nonspecific     Outpatient Encounter Prescriptions as of 02/03/2013  Medication Sig Dispense Refill  . adalimumab (HUMIRA PEN) 40 MG/0.8ML injection Inject 40 mg into the skin.       Marland Kitchen ALPRAZolam (XANAX) 0.5 MG tablet as needed.      Marland Kitchen amiodarone (PACERONE) 200 MG tablet Take 200mg  (1 tab) alternating with 100mg  (1/2 tab) every other day.  30 tablet  6  . aspirin 81 MG tablet Take 81 mg by mouth daily.        . Calcium Carbonate-Vit D-Min 600-400 MG-UNIT TABS Take 1 tablet by mouth daily.        . cholecalciferol (VITAMIN D) 1000 UNITS tablet Take 1,000 Units by mouth daily.        Marland Kitchen dimenhyDRINATE (DRAMAMINE) 50 MG tablet Take 50 mg by mouth every 8 (eight) hours as needed.        . fluticasone (FLONASE) 50 MCG/ACT nasal spray 2 sprays by Nasal route daily.        . folic acid (FOLVITE) 1 MG tablet Take 1 tablet by mouth daily.       Marland Kitchen loratadine (CLARITIN) 10 MG tablet 2 hrs before Humira       . losartan (COZAAR) 100 MG  tablet Take 1 tablet (100 mg total) by mouth daily.  90 tablet  3  . Multiple Vitamin (MULTIVITAMIN) tablet Take 1 tablet by mouth daily.        . nitroGLYCERIN (NITROSTAT) 0.4 MG SL tablet Take 1 tablet (0.4 mg total) by mouth as directed.  25 tablet  6  . omeprazole (PRILOSEC) 40 MG capsule TAKE 1 CAPSULE BY MOUTH DAILY.  30 capsule  1  . predniSONE (DELTASONE) 20 MG tablet Take 20 mg by mouth as needed. 30mg , 30mg ,  two days prior to humira day of humira takes 20mg       . ranitidine (ZANTAC) 150 MG tablet 2 hrs before Humira       . simvastatin (ZOCOR) 40 MG tablet Take 1 tablet by mouth daily.      Marland Kitchen warfarin (COUMADIN)  5 MG tablet Take 1 tablet (5 mg total) by mouth as directed.  40 tablet  3    Review of Systems  Constitutional: Negative.   HENT: Negative.   Eyes: Negative.  Eye itching: this.  Respiratory: Negative.   Cardiovascular: Negative.   Gastrointestinal: Negative.   Musculoskeletal: Negative.   Skin: Negative.   Neurological: Negative.   Psychiatric/Behavioral: Negative.        Decreased memory, delay in his speech.  All other systems reviewed and are negative.    BP 122/72  Pulse 69  Ht 5\' 10"  (1.778 m)  Wt 204 lb 8 oz (92.761 kg)  BMI 29.34 kg/m2  Physical Exam  Nursing note and vitals reviewed. Constitutional: He is oriented to person, place, and time. He appears well-developed and well-nourished.  HENT:  Head: Normocephalic.  Nose: Nose normal.  Mouth/Throat: Oropharynx is clear and moist.  Eyes: Conjunctivae are normal. Pupils are equal, round, and reactive to light.  Neck: Normal range of motion. Neck supple. No JVD present.  Cardiovascular: Normal rate, regular rhythm, S1 normal, S2 normal, normal heart sounds and intact distal pulses.  Exam reveals no gallop and no friction rub.   No murmur heard. Pulmonary/Chest: Effort normal and breath sounds normal. No respiratory distress. He has no wheezes. He has no rales. He exhibits no tenderness.  Abdominal: Soft. Bowel sounds are normal. He exhibits no distension. There is no tenderness.  Musculoskeletal: Normal range of motion. He exhibits no edema and no tenderness.  Lymphadenopathy:    He has no cervical adenopathy.  Neurological: He is alert and oriented to person, place, and time. Coordination normal.  Skin: Skin is warm and dry. No rash noted. No erythema.  Psychiatric: Judgment and thought content normal. His affect is blunt. He is slowed.  Flat affect    Assessment and Plan

## 2013-04-15 NOTE — Assessment & Plan Note (Signed)
Recent cardiac catheterization August 2014 showing patent grafts. This was done for syncope

## 2013-04-16 ENCOUNTER — Encounter: Payer: Self-pay | Admitting: *Deleted

## 2013-04-21 ENCOUNTER — Other Ambulatory Visit: Payer: Self-pay | Admitting: Physician Assistant

## 2013-04-28 ENCOUNTER — Ambulatory Visit (INDEPENDENT_AMBULATORY_CARE_PROVIDER_SITE_OTHER): Payer: Medicare Other

## 2013-04-28 DIAGNOSIS — Z7901 Long term (current) use of anticoagulants: Secondary | ICD-10-CM

## 2013-04-28 DIAGNOSIS — D66 Hereditary factor VIII deficiency: Secondary | ICD-10-CM

## 2013-04-28 DIAGNOSIS — I2699 Other pulmonary embolism without acute cor pulmonale: Secondary | ICD-10-CM

## 2013-04-30 ENCOUNTER — Telehealth: Payer: Self-pay

## 2013-04-30 NOTE — Telephone Encounter (Signed)
Pt wife called states pt BP is 155/102- 20 min ago, up to 200/93-on 04/24/2013. Please call

## 2013-04-30 NOTE — Telephone Encounter (Signed)
Spoke w/ wife.  States that pt hasn't felt good for the past 2 days. Pt denies headache or syncope.  Wife reports poor appetite recently. Coumadin check Wednesday,blood was"too thin", so wife is currently cooking kale for supper. On last visit, pt was given instructions on what to do if bp drops, but it has "never been up before". 200/93 on 9/13, wife states pt was "just sitting around" at that time. 155/102 today while pt watching a comedy movie on TV.   Please advise.

## 2013-05-03 NOTE — Telephone Encounter (Signed)
He typically does not have very elevated blood pressure This is abnormal for him Would continue to track numbers carefully, daily at different times of the day Write them  down if possible  If blood pressure continues to run high, would prescribe amlodipine 10 mg daily Cut in half to start daily and only increase after several days if blood pressure continues to run high Probably needs to have followup in clinic

## 2013-05-04 NOTE — Telephone Encounter (Signed)
Spoke w/ wife.  She states that he doing "okay" today.  She will monitor his bp this week, let us know his readings at his coumadin check on 10/1, but call us if they continue to run high.

## 2013-05-07 ENCOUNTER — Emergency Department: Payer: Self-pay | Admitting: Emergency Medicine

## 2013-05-07 LAB — PROTIME-INR: INR: 2.9

## 2013-05-07 LAB — APTT: Activated PTT: 38.4 secs — ABNORMAL HIGH (ref 23.6–35.9)

## 2013-05-10 ENCOUNTER — Telehealth: Payer: Self-pay

## 2013-05-10 ENCOUNTER — Encounter: Payer: Self-pay | Admitting: Physician Assistant

## 2013-05-10 ENCOUNTER — Ambulatory Visit (INDEPENDENT_AMBULATORY_CARE_PROVIDER_SITE_OTHER): Payer: Medicare Other | Admitting: Physician Assistant

## 2013-05-10 VITALS — BP 142/90 | HR 64 | Ht 70.0 in | Wt 210.5 lb

## 2013-05-10 DIAGNOSIS — I10 Essential (primary) hypertension: Secondary | ICD-10-CM

## 2013-05-10 DIAGNOSIS — S301XXA Contusion of abdominal wall, initial encounter: Secondary | ICD-10-CM

## 2013-05-10 MED ORDER — CARVEDILOL 3.125 MG PO TABS: 3.1250 mg | ORAL_TABLET | Freq: Two times a day (BID) | ORAL | Status: DC

## 2013-05-10 NOTE — Telephone Encounter (Signed)
Pt wife states that cath site has a "huge knot" and is black and blue, states pt was in ED Friday am, they did an u/s and no blockages, but pt wife states pt is getting no better. Has an appt tomorrow with Alinda Money, but wants him to be seen today.Please call. (I have requested that u/s from [facility])

## 2013-05-10 NOTE — Patient Instructions (Addendum)
Please restrict your activity level (no squatting, no heavy lifting- nothing over 5 lbs).   You may use ice packs or warm compresses on the hematoma site. This may take some time to resolve. Coumadin likely had an effect on the size of the blood collection after the heart catheterization.   Please take tramadol as needed for pain. Please take carvedilol as prescribed. Monitor your blood pressure and record it with this new medication. Please call with any new symptoms or concerns.   We will see you back in 1 month otherwise.

## 2013-05-10 NOTE — Assessment & Plan Note (Signed)
Recommendation per telephone notes was to start amlodipine which interacts with simvastatin at doses > 20mg . He has not tolerated alternative statins in the past (Crestor). Will start carvedilol 3.125mg  BID. He has CAD and now pacer support to protect against excessive bradycardia.

## 2013-05-10 NOTE — Assessment & Plan Note (Signed)
Appreciated on recent u/s in the ED. No evidence of bruit on exam. Non-erythematous. Afebrile. No leukocytosis. Reviewing the patient's INR trend, his Coumadin level was therapeutic a day after cath. Therapeutic anticoagulation paired with premature activity post-cath may have contributed. Advised to continue restrict activity. Provided reassurance that the hematoma is stable. No evidence of expansion or resultant anemia. Patient has not tolerated narcotics well in the past d/t AMS. Will prescribe with Ultram PRN. He will apply ice as needed.

## 2013-05-10 NOTE — Progress Notes (Signed)
Patient ID: [name] [name], male   DOB: 1940/12/10, 72 y.o.   MRN: [id number]            Date:  05/10/2013   ID:  [name] [name], DOB 06-Sep-1940, MRN [id number]  PCP:  Bosie Clos, MD  Primary Cardiologist:  Concha Se, MD  History of Present Illness:  [name] [name] is a 72 y.o. male w/ PMHx s/f CAD (s/p CABG in 2008, patent grafts on repeat cath 2010), symptomatic bradycardia (s/p Biotronik PPM), PAF, HTN, HLD, factor VIII deficiency, s/p IVC filter, chronic Coumadin anticoagulation and Crohn's disease who presents today for post-ED follow-up.   He underwent cardiac catheterization 03/23/2013 accessed via the R femoral artery revealing patent grafts and preserved EF. This was part of a work-up for recurrent syncope. Loop recorder was placed indicating SND correlated to syncope. He underwent PPM placement 03/2013.   Since cardiac catheterization, he has developed a persistent, R groin mass with associated swelling, bruising and tenderness. He denies discharge, bleeding, fevers, chills, dyspnea or lightheadedness. He [facility] ED 9/26 w/ these complaints. CBC indicated normal Hgb/Hct. No leukocytosis. VSS. R groin u/s indicated 3.3 x 2.69 x 2.68 cm mass. No DVT. INR 2.9.  He has also called the office recently w/ elevated BP (SBP 160-200s) which are clearly documented from home readings. He continues to take outpatient antihypertensives as prescribed. Denies excessive salt intake. No prior issues with elevated BPs in some time.   Wt Readings from Last 3 Encounters:  05/10/13 210 lb 8 oz (95.482 kg)  04/15/13 204 lb 8 oz (92.761 kg)  04/03/13 203 lb 7.8 oz (92.3 kg)     Past Medical History  Diagnosis Date  . Syncope and collapse     Evaluation by Dr Turner Daniels; Syncope probably neutrally mediated and modest resting sinus bradycardia. and 16 B. episode of SVT that was either A fib or another type of SVT. Patient improved with combination of holding ACE inhibitor or low BP and reducing beta  blocker for bradycardia  . Hypertension                                                                                                                                                                                                                                                                                                                                                                                                                       .  Coronary artery disease     relook cath 08/12/08 - all 5 grafts are patent  . Crohn's disease     Humara Rx in past  . SVT (supraventricular tachycardia)   . 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 History of benign colon tumor   . Sleep apnea     questionable  . Mitral regurgitation     mild - echo 4/09  . Anxiety   . Gait difficulty     with Crestor (but tolerates simva)  . Pulmonary embolism     2007,b CT scan, IVC fiter placed then because of concern about coumadin use at that time.  . Orthostasis     treated  . Drug therapy     Intermittant steroids   . S/P IVC filter     2007  PE  . Warfarin anticoagulation   . Factor VIII     Factor VIII EXCESS.Marland KitchenMarland KitchenDr Hampton Abbot.Marland Kitchenanother reason for coumadin  . Hx of CABG     2008  . Incomplete RBBB   . Drug therapy     question rash diltiazem, and amio  . Ejection fraction     EF 60-65%, echo, March, 2012, moderate diastolic dysfunction  . 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 Sherene Sires; has chronic shortness of breath  . Atrial fibrillation     April, 2013, new diagnosis  . Bradycardia     Sinus bradycardia, asymptomatic, April, 2013  . Carotid artery disease     Dopplers to be checked, June, 2013  . Cough     October, 2013  . Tremor     October, 2013    Current Outpatient Prescriptions  Medication Sig Dispense Refill  . acetaminophen (TYLENOL) 325 MG tablet Take 650 mg by mouth every 6 (six) hours as needed for pain.      Marland Kitchen adalimumab (HUMIRA PEN) 40 MG/0.8ML injection Inject 40 mg into the skin  every 14 (fourteen) days. Every two weeks      . ALPRAZolam (XANAX) 0.5 MG tablet Take 0.5 mg by mouth at bedtime as needed for sleep.      Marland Kitchen amiodarone (PACERONE) 200 MG tablet TAKE 1 TABLET ALTERNATING WITH 1/2 TABLET EVERY OTHER DAY.  30 tablet  6  . aspirin 81 MG tablet Take 81 mg by mouth daily.        . Calcium Carbonate-Vit D-Min 600-400 MG-UNIT TABS Take 1 tablet by mouth daily.        Marland Kitchen dimenhyDRINATE (DRAMAMINE) 50 MG tablet Take 50 mg by mouth every 8 (eight) hours as needed (for dizziness).       Marland Kitchen donepezil (ARICEPT) 10 MG tablet Take 10 mg by mouth every morning.       . fluticasone (FLONASE) 50 MCG/ACT nasal spray 2 sprays by Nasal route daily.        . folic acid (FOLVITE) 1 MG tablet Take 1 tablet by mouth daily.       Marland Kitchen loratadine (CLARITIN) 10 MG tablet 2 hrs before Humira       . losartan (COZAAR) 100 MG tablet Take 1 tablet (100 mg total) by mouth daily.  90 tablet  3  . Multiple Vitamin (MULTIVITAMIN) tablet Take 1 tablet by mouth daily.        . nitroGLYCERIN (NITROSTAT) 0.4 MG SL tablet Take 1 tablet (0.4 mg total) by mouth as directed.  25 tablet  6  . omeprazole (PRILOSEC) 40 MG capsule Take 40 mg by mouth  daily.      . omeprazole (PRILOSEC) 40 MG capsule TAKE 1 CAPSULE BY MOUTH DAILY.  30 capsule  1  . predniSONE (DELTASONE) 20 MG tablet Take 20 mg by mouth as needed. 30mg , 30mg ,  two days prior to humira day of humira takes 20mg       . ranitidine (ZANTAC) 150 MG tablet 2 hrs before Humira       . simvastatin (ZOCOR) 40 MG tablet Take 1 tablet by mouth at bedtime.       Marland Kitchen warfarin (COUMADIN) 5 MG tablet Take 5 mg by mouth daily. Takes 5mg  Tuesdays, Thursdays, Sundays Takes 2.5mg  Mondays, Wednesdays, Fridays, Saturdays       No current facility-administered medications for this visit.    Allergies:    Allergies  Allergen Reactions  . Codeine Nausea Only  . Diltiazem Hcl Itching and Rash  . Doxycycline Itching and Rash  . Infliximab Itching and Rash  .  Mercaptopurine Itching and Rash  . Mesalamine Itching and Rash  . Methotrexate Itching and Rash  . Metronidazole Itching and Rash  . Pyridostigmine Bromide Nausea Only    Social History:  The patient  reports that he quit smoking about 51 years ago. His smoking use included Cigarettes. He smoked 0.00 packs per day. He has never used smokeless tobacco. He reports that he does not drink alcohol or use illicit drugs.   Family History:  Family History  Problem Relation Age of Onset  . Family history unknown: Yes    Review of Systems: General: negative for chills, fever, night sweats or weight changes.  Cardiovascular: negative for chest pain, dyspnea on exertion, edema, orthopnea, palpitations, paroxysmal nocturnal dyspnea or shortness of breath Dermatological: R groin swelling, negative for rash Respiratory: negative for cough or wheezing Urologic: negative for hematuria Abdominal: negative for nausea, vomiting, diarrhea, bright red blood per rectum, melena, or hematemesis Neurologic: negative for visual changes, syncope, or dizzines All other systems reviewed and are otherwise negative except as noted above.  PHYSICAL EXAM: VS:  BP 142/90  Pulse 64  Ht 5\' 10"  (1.778 m)  Wt 210 lb 8 oz (95.482 kg)  BMI 30.2 kg/m2 Well nourished, well developed, in no acute distress HEENT: normal, PERRL Neck: no JVD or bruits Cardiac:  normal S1, S2; RRR; no murmur or gallops Lungs:  clear to auscultation bilaterally, no wheezing, rhonchi or rales Abd: soft, nontender, no hepatomegaly, normoactive BS x 4 quads Ext: R groin well circumscribed indurated, edematous mass, ecchymotic, tenderness to palpation, non-erythematous, no bruits, no cyanosis or clubbing Skin: warm and dry, cap refill < 2 sec Neuro:  CNs 2-12 intact, no focal abnormalities noted Musculoskeletal: strength and tone appropriate for age  Psych: normal affect

## 2013-05-10 NOTE — Telephone Encounter (Signed)
Spoke w/ wife.  She states that knot on pt's groin is causing "constant pain" and he does not want to wait until tomorrow to be seen. Pt's appt moved from tomorrow w/ Alinda Money, PA to today at 2:30.

## 2013-05-11 ENCOUNTER — Ambulatory Visit: Payer: Medicare Other | Admitting: Physician Assistant

## 2013-05-12 ENCOUNTER — Ambulatory Visit (INDEPENDENT_AMBULATORY_CARE_PROVIDER_SITE_OTHER): Payer: Medicare Other | Admitting: General Practice

## 2013-05-12 DIAGNOSIS — I2699 Other pulmonary embolism without acute cor pulmonale: Secondary | ICD-10-CM

## 2013-05-12 DIAGNOSIS — D66 Hereditary factor VIII deficiency: Secondary | ICD-10-CM

## 2013-05-12 DIAGNOSIS — Z7901 Long term (current) use of anticoagulants: Secondary | ICD-10-CM

## 2013-05-17 ENCOUNTER — Encounter: Payer: Self-pay | Admitting: Internal Medicine

## 2013-05-26 ENCOUNTER — Ambulatory Visit (INDEPENDENT_AMBULATORY_CARE_PROVIDER_SITE_OTHER): Payer: Medicare Other | Admitting: General Practice

## 2013-05-26 ENCOUNTER — Telehealth: Payer: Self-pay

## 2013-05-26 DIAGNOSIS — Z7901 Long term (current) use of anticoagulants: Secondary | ICD-10-CM

## 2013-05-26 DIAGNOSIS — I2699 Other pulmonary embolism without acute cor pulmonale: Secondary | ICD-10-CM

## 2013-05-26 DIAGNOSIS — D66 Hereditary factor VIII deficiency: Secondary | ICD-10-CM

## 2013-05-26 NOTE — Telephone Encounter (Signed)
Pt reports since started on Carvedilol at 05/10/13 OV pt has been having incr episodes of dizziness associated with decreased BP's.  Pt reports BP's in the low 100's/60's when symptomatic. Pt reports decreasing Carvedilol 3.125mg  BID to just once daily.  Advised pt to try to to take half of the 3.125mg  of Carvedilol BID instead of 1 tablet QD and continue to monitor BP and symptoms.  Call back if dizziness persists and BP continues to run low. Pt WCB if needs further medication titration.  Discouraged pt from self adjusting medications.

## 2013-05-26 NOTE — Telephone Encounter (Signed)
Coreg was used for heart rate control He was in atrial flutter with rates >100 He may need to raise dry weight (dialysis) IF bp low, may need midodrine 5 mg TID

## 2013-06-09 ENCOUNTER — Encounter: Payer: Self-pay | Admitting: Cardiovascular Disease

## 2013-06-09 ENCOUNTER — Ambulatory Visit (INDEPENDENT_AMBULATORY_CARE_PROVIDER_SITE_OTHER): Payer: Medicare Other | Admitting: Cardiovascular Disease

## 2013-06-09 ENCOUNTER — Ambulatory Visit (INDEPENDENT_AMBULATORY_CARE_PROVIDER_SITE_OTHER): Payer: Medicare Other | Admitting: General Practice

## 2013-06-09 VITALS — BP 126/74 | HR 60 | Ht 70.0 in | Wt 209.5 lb

## 2013-06-09 DIAGNOSIS — R413 Other amnesia: Secondary | ICD-10-CM

## 2013-06-09 DIAGNOSIS — I10 Essential (primary) hypertension: Secondary | ICD-10-CM

## 2013-06-09 DIAGNOSIS — R0602 Shortness of breath: Secondary | ICD-10-CM

## 2013-06-09 DIAGNOSIS — I2510 Atherosclerotic heart disease of native coronary artery without angina pectoris: Secondary | ICD-10-CM

## 2013-06-09 DIAGNOSIS — D66 Hereditary factor VIII deficiency: Secondary | ICD-10-CM

## 2013-06-09 DIAGNOSIS — Z7901 Long term (current) use of anticoagulants: Secondary | ICD-10-CM

## 2013-06-09 DIAGNOSIS — Z951 Presence of aortocoronary bypass graft: Secondary | ICD-10-CM

## 2013-06-09 DIAGNOSIS — I2699 Other pulmonary embolism without acute cor pulmonale: Secondary | ICD-10-CM

## 2013-06-09 LAB — POCT INR: INR: 2.5

## 2013-06-09 MED ORDER — AMLODIPINE BESYLATE 5 MG PO TABS: 5.0000 mg | ORAL_TABLET | Freq: Every day | ORAL | Status: DC

## 2013-06-09 MED ORDER — NITROGLYCERIN 0.4 MG SL SUBL: 0.4000 mg | SUBLINGUAL_TABLET | SUBLINGUAL | Status: DC

## 2013-06-09 NOTE — Progress Notes (Signed)
Patient ID: [name] [name], male    DOB: 15-Jan-1941, 72 y.o.   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 Recent workup August 2014 for continued episodes of syncope found to have sick sinus syndrome, loop monitor placed with continued episodes of syncope, pacemaker placed at [facility] August 2014.   History of confusion on prior hospital visits . LifePort shuffled gait, problems with speech, tremor. He has seen neurology. MRI showed atrophy Carotid ultrasound June 2013 showing mild carotid arterial disease He is very sedentary at baseline, does not do any regular exercise. His wife reports he is very short at times, sometimes at rest, sometimes with minimal exertion. Seems to cough and puff after doing next nothing. Sometimes she is very scared and wonders if she should take him to the emergency room.   Recent cardiac catheterization 03/23/2013 showing patent grafts x5, LIMA to the LAD, vein graft to the diagonal, vein graft to the RCA, vein graft to the OM1 jump graft to the OM 2, ejection fraction 50%  Previous H/o atrial fibrillation on amiodarone, h/o  neurocardiogenic syncope,  MRI that showed no acute abnormality. some bradycardia.  on amiodarone, weaned off metoprolol Previous episodes of syncope in the past felt to be neurocardiogenic.  episode of syncope  at a restaurant  waiting in line for food. He had nausea, lightheadedness, then had syncope. He is on the ground for several minutes before he came to. Workup in the hospital showed sodium of 122, normal echocardiogram, normal telemetry with normal sinus rhythm. It was felt that he could be orthostatic from the start HCTZ earlier that week with a change  in his sodium.    Myoview was performed that showed no ischemia. 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 acetaminophen (TYLENOL) 325 MG tablet Take 650 mg by mouth every 6 (six) hours as needed for pain.      Marland Kitchen adalimumab (HUMIRA PEN) 40 MG/0.8ML injection Inject 40 mg into the skin every 14 (fourteen) days. Every two weeks      . ALPRAZolam (XANAX) 0.5 MG tablet Take 0.5 mg by mouth at bedtime as needed for sleep.      Marland Kitchen amiodarone (PACERONE) 200 MG tablet TAKE 1 TABLET ALTERNATING WITH 1/2 TABLET EVERY OTHER DAY.  30 tablet  6  . aspirin 81 MG tablet Take 81 mg by mouth daily.        . Calcium Carbonate-Vit D-Min 600-400 MG-UNIT TABS Take 1 tablet by mouth daily.        . carvedilol (COREG) 3.125 MG tablet Take 1 tablet (3.125 mg total) by mouth 2 (two) times daily.  60 tablet  3  . dimenhyDRINATE (DRAMAMINE) 50 MG tablet Take 50 mg by mouth every 8 (eight) hours as needed (for dizziness).       . fluticasone (FLONASE) 50 MCG/ACT nasal spray 2 sprays by Nasal route daily.        Marland Kitchen  folic acid (FOLVITE) 1 MG tablet Take 1 tablet by mouth daily.       Marland Kitchen loratadine (CLARITIN) 10 MG tablet 2 hrs before Humira       . losartan (COZAAR) 100 MG tablet Take 1 tablet (100 mg total) by mouth daily.  90 tablet  3  . Multiple Vitamin (MULTIVITAMIN) tablet Take 1 tablet by mouth daily.        . nitroGLYCERIN (NITROSTAT) 0.4 MG SL tablet Take 1 tablet (0.4 mg total) by mouth as directed.  25 tablet  6  . omeprazole (PRILOSEC) 40 MG capsule TAKE 1 CAPSULE BY MOUTH DAILY.  30 capsule  1  . predniSONE (DELTASONE) 20 MG tablet Take 20 mg by  mouth as needed. 30mg , 30mg ,  two days prior to humira day of humira takes 20mg       . ranitidine (ZANTAC) 150 MG tablet 2 hrs before Humira       . simvastatin (ZOCOR) 40 MG tablet Take 1 tablet by mouth at bedtime.       Marland Kitchen warfarin (COUMADIN) 5 MG tablet Take 5 mg by mouth daily. Takes 5mg  Tuesdays, Thursdays, Sundays Takes 2.5mg  Mondays, Wednesdays, Fridays, Saturdays      . [DISCONTINUED] omeprazole (PRILOSEC) 40 MG capsule Take 40 mg by mouth daily.       No facility-administered encounter medications on file as of 06/09/2013.     Review of Systems  Constitutional: Negative.   HENT: Negative.   Eyes: Negative.  Eye itching: this.  Respiratory: Positive for shortness of breath.   Cardiovascular: Negative.   Gastrointestinal: Negative.   Endocrine: Negative.   Musculoskeletal: Negative.   Skin: Negative.   Allergic/Immunologic: Negative.   Neurological: Negative.   Hematological: Negative.   Psychiatric/Behavioral: Negative.        Decreased memory, delay in his speech.  All other systems reviewed and are negative.    BP 126/74  Pulse 60  Ht 5\' 10"  (1.778 m)  Wt 209 lb 8 oz (95.029 kg)  BMI 30.06 kg/m2  Physical Exam  Nursing note and vitals reviewed. Constitutional: He is oriented to person, place, and time. He appears well-developed and well-nourished.  HENT:  Head: Normocephalic.  Nose: Nose normal.  Mouth/Throat: Oropharynx is clear and moist.  Eyes: Conjunctivae are normal. Pupils are equal, round, and reactive to light.  Neck: Normal range of motion. Neck supple. No JVD present.  Cardiovascular: Normal rate, regular rhythm, S1 normal, S2 normal, normal heart sounds and intact distal pulses.  Exam reveals no gallop and no friction rub.   No murmur heard. Pulmonary/Chest: Effort normal and breath sounds normal. No respiratory distress. He has no wheezes. He has no rales. He exhibits no tenderness.  Abdominal: Soft. Bowel sounds are normal. He exhibits no  distension. There is no tenderness.  Musculoskeletal: Normal range of motion. He exhibits no edema and no tenderness.  Lymphadenopathy:    He has no cervical adenopathy.  Neurological: He is alert and oriented to person, place, and time. Coordination normal.  Skin: Skin is warm and dry. No rash noted. No erythema.  Psychiatric: Judgment and thought content normal. His affect is blunt. He is slowed.  Flat affect    Assessment and Plan

## 2013-06-09 NOTE — Assessment & Plan Note (Signed)
I suspect his shortness of breath may be secondary to underlying neurologic issue. He does report occasional high blood pressures. We have suggested that he try nitroglycerin for acute episodes. He needs to work on his conditioning and have suggested regular walking program on a daily basis.

## 2013-06-09 NOTE — Assessment & Plan Note (Signed)
He provides numbers today and blood pressure is mildly elevated on a regular basis. We will add amlodipine 5 mg daily

## 2013-06-09 NOTE — Patient Instructions (Signed)
Please start amlodipine 5 mg once a day for high blood pressure For severe shortness of breath, take nitro as needed  Please call us if you have new issues that need to be addressed before your next appt.  Your physician wants you to follow-up in: 6 months.  You will receive a reminder letter in the mail two months in advance. If you don't receive a letter, please call our office to schedule the follow-up appointment.

## 2013-06-09 NOTE — Assessment & Plan Note (Signed)
Recent cardiac catheterization August 2014. Currently with no chest pain. No further medication changes made

## 2013-06-09 NOTE — Assessment & Plan Note (Signed)
Currently with no symptoms of angina. No further workup at this time. Continue current medication regimen. 

## 2013-06-09 NOTE — Assessment & Plan Note (Signed)
Suspect he may have early Alzheimer's or other neurologic issue. Some shuffled gait and tremor.

## 2013-06-14 ENCOUNTER — Other Ambulatory Visit: Payer: Self-pay | Admitting: Internal Medicine

## 2013-06-17 ENCOUNTER — Other Ambulatory Visit: Payer: Self-pay

## 2013-06-30 ENCOUNTER — Ambulatory Visit (INDEPENDENT_AMBULATORY_CARE_PROVIDER_SITE_OTHER): Payer: Medicare Other | Admitting: General Practice

## 2013-06-30 DIAGNOSIS — Z7901 Long term (current) use of anticoagulants: Secondary | ICD-10-CM

## 2013-06-30 DIAGNOSIS — D66 Hereditary factor VIII deficiency: Secondary | ICD-10-CM

## 2013-06-30 DIAGNOSIS — I2699 Other pulmonary embolism without acute cor pulmonale: Secondary | ICD-10-CM

## 2013-06-30 LAB — POCT INR: INR: 2.1

## 2013-07-12 ENCOUNTER — Other Ambulatory Visit: Payer: Self-pay | Admitting: Internal Medicine

## 2013-07-13 ENCOUNTER — Encounter: Payer: Self-pay | Admitting: Internal Medicine

## 2013-07-13 ENCOUNTER — Ambulatory Visit (INDEPENDENT_AMBULATORY_CARE_PROVIDER_SITE_OTHER): Payer: Medicare Other | Admitting: Internal Medicine

## 2013-07-13 VITALS — BP 93/66 | HR 60 | Ht 70.0 in | Wt 211.0 lb

## 2013-07-13 DIAGNOSIS — T82198A Other mechanical complication of other cardiac electronic device, initial encounter: Secondary | ICD-10-CM

## 2013-07-13 DIAGNOSIS — Z950 Presence of cardiac pacemaker: Secondary | ICD-10-CM

## 2013-07-13 DIAGNOSIS — R0602 Shortness of breath: Secondary | ICD-10-CM

## 2013-07-13 DIAGNOSIS — I495 Sick sinus syndrome: Secondary | ICD-10-CM

## 2013-07-13 DIAGNOSIS — I4891 Unspecified atrial fibrillation: Secondary | ICD-10-CM

## 2013-07-13 LAB — CBC WITH DIFFERENTIAL/PLATELET
Eosinophils Relative: 7.6 % — ABNORMAL HIGH (ref 0.0–5.0)
Lymphocytes Relative: 35.5 % (ref 12.0–46.0)
MCV: 94.8 fl (ref 78.0–100.0)
Monocytes Absolute: 1.4 10*3/uL — ABNORMAL HIGH (ref 0.1–1.0)
Monocytes Relative: 19.5 % — ABNORMAL HIGH (ref 3.0–12.0)
Neutrophils Relative %: 36.7 % — ABNORMAL LOW (ref 43.0–77.0)
Platelets: 309 10*3/uL (ref 150.0–400.0)
RBC: 4.42 Mil/uL (ref 4.22–5.81)
RDW: 15.6 % — ABNORMAL HIGH (ref 11.5–14.6)
WBC: 7 10*3/uL (ref 4.5–10.5)

## 2013-07-13 LAB — MDC_IDC_ENUM_SESS_TYPE_INCLINIC
Brady Statistic RA Percent Paced: 37 %
Brady Statistic RV Percent Paced: 42 %
Date Time Interrogation Session: 20141202174806
Implantable Pulse Generator Model: 359529
Lead Channel Impedance Value: 331 Ohm
Lead Channel Sensing Intrinsic Amplitude: 0.9 mV
Lead Channel Sensing Intrinsic Amplitude: 1 mV
Lead Channel Sensing Intrinsic Amplitude: 1 mV
Lead Channel Sensing Intrinsic Amplitude: 1 mV
Lead Channel Sensing Intrinsic Amplitude: 1 mV
Lead Channel Sensing Intrinsic Amplitude: 6.1 mV
Lead Channel Sensing Intrinsic Amplitude: 6.3 mV
Lead Channel Sensing Intrinsic Amplitude: 6.3 mV
Lead Channel Sensing Intrinsic Amplitude: 6.5 mV
Lead Channel Sensing Intrinsic Amplitude: 6.8 mV
Lead Channel Sensing Intrinsic Amplitude: 7 mV
Lead Channel Sensing Intrinsic Amplitude: 7 mV

## 2013-07-13 LAB — BASIC METABOLIC PANEL
BUN: 14 mg/dL (ref 6–23)
Calcium: 9.1 mg/dL (ref 8.4–10.5)
Creatinine, Ser: 1.3 mg/dL (ref 0.4–1.5)
GFR: 55.9 mL/min — ABNORMAL LOW (ref >60.00)

## 2013-07-13 LAB — PROTIME-INR
INR: 2.3 ratio — ABNORMAL HIGH (ref 0.8–1.0)
Prothrombin Time: 24 s — ABNORMAL HIGH (ref 10.2–12.4)

## 2013-07-13 NOTE — Patient Instructions (Signed)
Atrial lead Revision on 07/21/13 at 12:30  Please be at [facility] En[facility] at 10:30am  No Coumadin the night of 07/20/13  Be prepared to spend one night at the hospital

## 2013-07-13 NOTE — Assessment & Plan Note (Signed)
He appears to be maintaining sinus rhythm. He'll continue his current medical therapy.

## 2013-07-13 NOTE — Progress Notes (Signed)
HPI [name] [name] returns today for followup. He is a very pleasant 72 year old man with a history of intermittent complete heart block and symptomatic bradycardia, status post permanent pacemaker insertion. In the interim, he has had no recurrent syncope. He does have fairly marked class III dyspnea. His wife was with him today states that he has no energy. Allergies  Allergen Reactions  . Codeine Nausea Only  . Diltiazem Hcl Itching and Rash  . Doxycycline Itching and Rash  . Infliximab Itching and Rash  . Mercaptopurine Itching and Rash  . Mesalamine Itching and Rash  . Methotrexate Itching and Rash  . Metronidazole Itching and Rash  . Pyridostigmine Bromide Nausea Only     Current Outpatient Prescriptions  Medication Sig Dispense Refill  . acetaminophen (TYLENOL) 325 MG tablet Take 650 mg by mouth every 6 (six) hours as needed for pain.      Marland Kitchen adalimumab (HUMIRA PEN) 40 MG/0.8ML injection Inject 40 mg into the skin every 14 (fourteen) days. Every two weeks      . ALPRAZolam (XANAX) 0.5 MG tablet Take 0.5 mg by mouth at bedtime as needed for sleep.      Marland Kitchen amiodarone (PACERONE) 200 MG tablet TAKE 1 TABLET ALTERNATING WITH 1/2 TABLET EVERY OTHER DAY.  30 tablet  6  . amLODipine (NORVASC) 5 MG tablet Take 1 tablet (5 mg total) by mouth daily.  30 tablet  6  . aspirin 81 MG tablet Take 81 mg by mouth daily.        . carvedilol (COREG) 3.125 MG tablet Take 1 tablet (3.125 mg total) by mouth 2 (two) times daily.  60 tablet  3  . dimenhyDRINATE (DRAMAMINE) 50 MG tablet Take 50 mg by mouth every 8 (eight) hours as needed (for dizziness).       . fluticasone (FLONASE) 50 MCG/ACT nasal spray 2 sprays by Nasal route daily.        . folic acid (FOLVITE) 1 MG tablet Take 1 tablet by mouth daily.       Marland Kitchen loratadine (CLARITIN) 10 MG tablet 2 hrs before Humira       . losartan (COZAAR) 100 MG tablet Take 1 tablet (100 mg total) by mouth daily.  90 tablet  3  . Multiple Vitamin (MULTIVITAMIN)  tablet Take 1 tablet by mouth daily.        . nitroGLYCERIN (NITROSTAT) 0.4 MG SL tablet Take 1 tablet (0.4 mg total) by mouth as directed.  25 tablet  6  . omeprazole (PRILOSEC) 40 MG capsule TAKE 1 CAPSULE BY MOUTH DAILY.  30 capsule  1  . predniSONE (DELTASONE) 20 MG tablet Take 20 mg by mouth as needed. 30mg , 30mg ,  two days prior to humira day of humira takes 20mg       . ranitidine (ZANTAC) 150 MG tablet 2 hrs before Humira       . simvastatin (ZOCOR) 40 MG tablet Take 1 tablet by mouth at bedtime.       Marland Kitchen warfarin (COUMADIN) 5 MG tablet Take 5 mg by mouth daily. Takes 5mg  Tuesdays, Thursdays, Sundays Takes 2.5mg  Mondays, Wednesdays, Fridays, Saturdays       No current facility-administered medications for this visit.     Past Medical History  Diagnosis Date  . Syncope and collapse     Evaluation by Dr Turner Daniels; Syncope probably neutrally mediated and modest resting sinus bradycardia. and 16 B. episode of SVT that was either A fib or another type of  SVT. Patient improved with combination of holding ACE inhibitor or low BP and reducing beta blocker for bradycardia  . Hypertension                                                                                                                                                                                                                                                                                                                                                                                                                       . Coronary artery disease     relook cath 08/12/08 - all 5 grafts are patent  . Crohn's disease     Humara Rx in past  . SVT (supraventricular tachycardia)   . 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 History of benign colon tumor   . Sleep apnea     questionable  . Mitral regurgitation     mild - echo 4/09  . Anxiety   . Gait difficulty     with Crestor (but tolerates simva)  . Pulmonary embolism     2007,b CT  scan, IVC fiter placed then because of concern about coumadin use at that time.  . Orthostasis     treated  . Drug therapy     Intermittant steroids   . S/P IVC filter     2007  PE  . Warfarin anticoagulation   . Factor VIII     Factor VIII EXCESS.Marland KitchenMarland KitchenDr Hampton Abbot.Marland Kitchenanother reason for coumadin  . Hx of CABG  2008  . Incomplete RBBB   . Drug therapy     question rash diltiazem, and amio  . Ejection fraction     EF 60-65%, echo, March, 2012, moderate diastolic dysfunction  . 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 Sherene Sires; has chronic shortness of breath  . Atrial fibrillation     April, 2013, new diagnosis  . Bradycardia     Sinus bradycardia, asymptomatic, April, 2013  . Carotid artery disease     Dopplers to be checked, June, 2013  . Cough     October, 2013  . Tremor     October, 2013    ROS:   All systems reviewed and negative except as noted in the HPI.   Past Surgical History  Procedure Laterality Date  . Coronary artery bypass graft  4/08  . Cholecystectomy    . Back surgery      x2  . Cataract extraction      x2  . Colonoscopy w/ polypectomy    . Insert / replace / remove pacemaker      [facility]  . Cardiac catheterization  8/14    [facility]  . Loop monitor       History reviewed. No pertinent family history.   History   Social History  . Marital Status: Married    Spouse Name: N/A    Number of Children: N/A  . Years of Education: N/A   Occupational History  . retired    Social History Main Topics  . Smoking status: Former Smoker    Types: Cigarettes    Quit date: 08/12/1961  . Smokeless tobacco: Never Used     Comment: smoked occ in his teenage yrs  . Alcohol Use: No  . Drug Use: No  . Sexual Activity: Not Currently   Other Topics Concern  . Not on file   Social History Narrative  . No narrative on file     BP 93/66  Pulse 60  Ht 5\' 10"  (1.778 m)  Wt 211 lb (95.709 kg)  BMI 30.28 kg/m2  Physical Exam:  Well appearing 72 year old  man,NAD HEENT: Unremarkable Neck:  No JVD, no thyromegally Back:  No CVA tenderness Lungs:  Clear with no wheezes, rales, or rhonchi. Well-healed pacemaker incision. HEART:  Regular rate rhythm, no murmurs, no rubs, no clicks Abd:  soft, positive bowel sounds, no organomegally, no rebound, no guarding Ext:  2 plus pulses, no edema, no cyanosis, no clubbing Skin:  No rashes no nodules Neuro:  CN II through XII intact, motor grossly intact   DEVICE  Normal device function.  See PaceArt for details.   Assess/Plan:

## 2013-07-13 NOTE — Assessment & Plan Note (Signed)
Interrogation of his Biotronik pacemaker demonstrates that his atrial lead had dislodged, and appears to be sitting on the tricuspid valve annulus. This is based on the finding that he does have T waves and R waves on atrial sensing interrogation, and has ventricular capture when he is pacing the atrium. I've discussed the treatment options in detail. The risk, goals, benefits, and expectations of pacemaker lead revision have been discussed and he wishes to proceed.

## 2013-07-13 NOTE — Assessment & Plan Note (Signed)
I suspect the etiology of hisdyspnea is multifactorial. That said, he has evidence of atrial lead dislodgment and atrial pacing dysfunction. Hopefully his symptoms will improve after repositioning of his atrial lead.

## 2013-07-20 ENCOUNTER — Ambulatory Visit: Payer: Self-pay | Admitting: Family Medicine

## 2013-07-20 MED ORDER — CEFAZOLIN SODIUM-DEXTROSE 2-3 GM-% IV SOLR
2.0000 g | INTRAVENOUS | Status: AC
  Filled 2013-07-20: qty 50

## 2013-07-20 MED ORDER — SODIUM CHLORIDE 0.9 % IR SOLN
80.0000 mg | Status: AC
  Filled 2013-07-20: qty 2

## 2013-07-21 ENCOUNTER — Ambulatory Visit (INDEPENDENT_AMBULATORY_CARE_PROVIDER_SITE_OTHER): Payer: Medicare Other | Admitting: General Practice

## 2013-07-21 DIAGNOSIS — Z7901 Long term (current) use of anticoagulants: Secondary | ICD-10-CM

## 2013-07-21 DIAGNOSIS — I2699 Other pulmonary embolism without acute cor pulmonale: Secondary | ICD-10-CM

## 2013-07-21 DIAGNOSIS — D66 Hereditary factor VIII deficiency: Secondary | ICD-10-CM

## 2013-07-21 LAB — POCT INR: INR: 2.1

## 2013-07-21 MED ORDER — CHLORHEXIDINE GLUCONATE 4 % EX LIQD
60.0000 mL | Freq: Once | CUTANEOUS | Status: DC
  Filled 2013-07-21: qty 60

## 2013-07-21 MED ORDER — MUPIROCIN 2 % EX OINT
TOPICAL_OINTMENT | CUTANEOUS | Status: AC
  Filled 2013-07-21: qty 22

## 2013-07-21 MED ORDER — SODIUM CHLORIDE 0.9 % IV SOLN
INTRAVENOUS | Status: DC
  Administered 2013-07-30: 11:00:00 via INTRAVENOUS

## 2013-07-22 ENCOUNTER — Encounter (HOSPITAL_COMMUNITY): Payer: Self-pay | Admitting: Pharmacy Technician

## 2013-07-26 ENCOUNTER — Ambulatory Visit (INDEPENDENT_AMBULATORY_CARE_PROVIDER_SITE_OTHER): Payer: Medicare Other | Admitting: General Practice

## 2013-07-26 DIAGNOSIS — I2699 Other pulmonary embolism without acute cor pulmonale: Secondary | ICD-10-CM

## 2013-07-26 DIAGNOSIS — D66 Hereditary factor VIII deficiency: Secondary | ICD-10-CM

## 2013-07-26 DIAGNOSIS — Z7901 Long term (current) use of anticoagulants: Secondary | ICD-10-CM

## 2013-07-26 LAB — POCT INR: INR: 2.1

## 2013-07-30 ENCOUNTER — Encounter (HOSPITAL_COMMUNITY): Payer: Self-pay | Admitting: General Practice

## 2013-07-30 ENCOUNTER — Encounter (HOSPITAL_COMMUNITY)
Admission: RE | Disposition: A | Discharge status: Home / Self Care | Payer: Self-pay | Source: Ambulatory Visit | Attending: Internal Medicine

## 2013-07-30 ENCOUNTER — Ambulatory Visit (HOSPITAL_COMMUNITY)
Admission: RE | Admit: 2013-07-30 | Discharge: 2013-07-31 | Disposition: A | Discharge status: Home / Self Care | Payer: Medicare Other | Source: Ambulatory Visit | Attending: Internal Medicine | Admitting: Internal Medicine

## 2013-07-30 DIAGNOSIS — I4891 Unspecified atrial fibrillation: Secondary | ICD-10-CM | POA: Insufficient documentation

## 2013-07-30 DIAGNOSIS — T82598A Other mechanical complication of other cardiac and vascular devices and implants, initial encounter: Secondary | ICD-10-CM | POA: Diagnosis present

## 2013-07-30 DIAGNOSIS — T829XXA Unspecified complication of cardiac and vascular prosthetic device, implant and graft, initial encounter: Secondary | ICD-10-CM | POA: Insufficient documentation

## 2013-07-30 DIAGNOSIS — Z7901 Long term (current) use of anticoagulants: Secondary | ICD-10-CM | POA: Insufficient documentation

## 2013-07-30 DIAGNOSIS — Z7982 Long term (current) use of aspirin: Secondary | ICD-10-CM | POA: Insufficient documentation

## 2013-07-30 DIAGNOSIS — T82190A Other mechanical complication of cardiac electrode, initial encounter: Secondary | ICD-10-CM

## 2013-07-30 DIAGNOSIS — Z86711 Personal history of pulmonary embolism: Secondary | ICD-10-CM | POA: Insufficient documentation

## 2013-07-30 DIAGNOSIS — I2510 Atherosclerotic heart disease of native coronary artery without angina pectoris: Secondary | ICD-10-CM | POA: Insufficient documentation

## 2013-07-30 DIAGNOSIS — K219 Gastro-esophageal reflux disease without esophagitis: Secondary | ICD-10-CM | POA: Insufficient documentation

## 2013-07-30 DIAGNOSIS — I442 Atrioventricular block, complete: Secondary | ICD-10-CM | POA: Insufficient documentation

## 2013-07-30 DIAGNOSIS — G4733 Obstructive sleep apnea (adult) (pediatric): Secondary | ICD-10-CM | POA: Insufficient documentation

## 2013-07-30 DIAGNOSIS — T82198A Other mechanical complication of other cardiac electronic device, initial encounter: Secondary | ICD-10-CM

## 2013-07-30 DIAGNOSIS — R001 Bradycardia, unspecified: Secondary | ICD-10-CM | POA: Diagnosis present

## 2013-07-30 DIAGNOSIS — I10 Essential (primary) hypertension: Secondary | ICD-10-CM | POA: Insufficient documentation

## 2013-07-30 DIAGNOSIS — Z951 Presence of aortocoronary bypass graft: Secondary | ICD-10-CM | POA: Insufficient documentation

## 2013-07-30 DIAGNOSIS — Z87891 Personal history of nicotine dependence: Secondary | ICD-10-CM | POA: Insufficient documentation

## 2013-07-30 DIAGNOSIS — I4510 Unspecified right bundle-branch block: Secondary | ICD-10-CM | POA: Insufficient documentation

## 2013-07-30 DIAGNOSIS — Y831 Surgical operation with implant of artificial internal device as the cause of abnormal reaction of the patient, or of later complication, without mention of misadventure at the time of the procedure: Secondary | ICD-10-CM | POA: Insufficient documentation

## 2013-07-30 HISTORY — DX: Paroxysmal atrial fibrillation: I48.0

## 2013-07-30 HISTORY — PX: LEAD REVISION: SHX5945

## 2013-07-30 HISTORY — DX: Obstructive sleep apnea (adult) (pediatric): G47.33

## 2013-07-30 HISTORY — DX: Pure hypercholesterolemia, unspecified: E78.00

## 2013-07-30 HISTORY — DX: Gastro-esophageal reflux disease without esophagitis: K21.9

## 2013-07-30 LAB — BASIC METABOLIC PANEL
CO2: 26 mEq/L (ref 19–32)
Calcium: 8.8 mg/dL (ref 8.4–10.5)
Chloride: 99 mEq/L (ref 96–112)
Creatinine, Ser: 1.06 mg/dL (ref 0.50–1.35)
Glucose, Bld: 90 mg/dL (ref 70–99)
Sodium: 134 mEq/L — ABNORMAL LOW (ref 135–145)

## 2013-07-30 LAB — CBC
Hemoglobin: 14.6 g/dL (ref 13.0–17.0)
MCH: 32.1 pg (ref 26.0–34.0)
MCHC: 35 g/dL (ref 30.0–36.0)
MCV: 91.6 fL (ref 78.0–100.0)
Platelets: 245 10*3/uL (ref 150–400)
RBC: 4.55 MIL/uL (ref 4.22–5.81)

## 2013-07-30 LAB — PROTIME-INR: INR: 2.22 — ABNORMAL HIGH (ref 0.00–1.49)

## 2013-07-30 SURGERY — LEAD REVISION
Anesthesia: LOCAL

## 2013-07-30 MED ORDER — WARFARIN SODIUM 5 MG PO TABS: 5.0000 mg | ORAL_TABLET | Freq: Every day | ORAL | Status: DC

## 2013-07-30 MED ORDER — AMIODARONE HCL 200 MG PO TABS
200.0000 mg | ORAL_TABLET | ORAL | Status: DC
  Administered 2013-07-30: 20:00:00 200 mg via ORAL
  Filled 2013-07-30: qty 1

## 2013-07-30 MED ORDER — WARFARIN SODIUM 2.5 MG PO TABS
2.5000 mg | ORAL_TABLET | Freq: Every day | ORAL | Status: DC
  Administered 2013-07-30: 2.5 mg via ORAL
  Filled 2013-07-30 (×2): qty 1

## 2013-07-30 MED ORDER — ONDANSETRON HCL 4 MG/2ML IJ SOLN: 4.0000 mg | Freq: Four times a day (QID) | INTRAMUSCULAR | Status: DC | PRN

## 2013-07-30 MED ORDER — LOSARTAN POTASSIUM 50 MG PO TABS
100.0000 mg | ORAL_TABLET | Freq: Every day | ORAL | Status: DC
  Administered 2013-07-30: 100 mg via ORAL
  Filled 2013-07-30 (×2): qty 2

## 2013-07-30 MED ORDER — AMIODARONE HCL 100 MG PO TABS
100.0000 mg | ORAL_TABLET | ORAL | Status: DC
  Filled 2013-07-30: qty 1

## 2013-07-30 MED ORDER — DIPHENHYDRAMINE HCL 25 MG PO CAPS: 25.0000 mg | ORAL_CAPSULE | ORAL | Status: DC

## 2013-07-30 MED ORDER — FENTANYL CITRATE 0.05 MG/ML IJ SOLN
INTRAMUSCULAR | Status: AC
  Filled 2013-07-30: qty 2

## 2013-07-30 MED ORDER — MUPIROCIN 2 % EX OINT
TOPICAL_OINTMENT | CUTANEOUS | Status: AC
  Filled 2013-07-30: qty 22

## 2013-07-30 MED ORDER — ALPRAZOLAM 0.5 MG PO TABS: 0.5000 mg | ORAL_TABLET | Freq: Every evening | ORAL | Status: DC | PRN

## 2013-07-30 MED ORDER — CARVEDILOL 3.125 MG PO TABS
3.1250 mg | ORAL_TABLET | Freq: Two times a day (BID) | ORAL | Status: DC
  Administered 2013-07-30: 23:00:00 3.125 mg via ORAL
  Filled 2013-07-30 (×3): qty 1

## 2013-07-30 MED ORDER — CEFAZOLIN SODIUM-DEXTROSE 2-3 GM-% IV SOLR
2.0000 g | INTRAVENOUS | Status: DC
  Filled 2013-07-30: qty 50

## 2013-07-30 MED ORDER — MIDAZOLAM HCL 5 MG/5ML IJ SOLN
INTRAMUSCULAR | Status: AC
  Filled 2013-07-30: qty 5

## 2013-07-30 MED ORDER — MUPIROCIN 2 % EX OINT
TOPICAL_OINTMENT | Freq: Two times a day (BID) | CUTANEOUS | Status: DC
  Administered 2013-07-30: 11:00:00 via NASAL
  Filled 2013-07-30: qty 22

## 2013-07-30 MED ORDER — ACETAMINOPHEN 325 MG PO TABS: 325.0000 mg | ORAL_TABLET | ORAL | Status: DC | PRN

## 2013-07-30 MED ORDER — SODIUM CHLORIDE 0.9 % IJ SOLN
3.0000 mL | Freq: Two times a day (BID) | INTRAMUSCULAR | Status: DC
  Administered 2013-07-30: 23:00:00 3 mL via INTRAVENOUS

## 2013-07-30 MED ORDER — FOLIC ACID 1 MG PO TABS
1.0000 mg | ORAL_TABLET | Freq: Every day | ORAL | Status: DC
  Administered 2013-07-30: 20:00:00 1 mg via ORAL
  Filled 2013-07-30 (×2): qty 1

## 2013-07-30 MED ORDER — DIMENHYDRINATE 50 MG PO TABS: 50.0000 mg | ORAL_TABLET | Freq: Three times a day (TID) | ORAL | Status: DC | PRN

## 2013-07-30 MED ORDER — SODIUM CHLORIDE 0.9 % IJ SOLN: 3.0000 mL | INTRAMUSCULAR | Status: DC | PRN

## 2013-07-30 MED ORDER — WARFARIN - PHYSICIAN DOSING INPATIENT: Freq: Every day | Status: DC

## 2013-07-30 MED ORDER — SIMVASTATIN 40 MG PO TABS
40.0000 mg | ORAL_TABLET | Freq: Every day | ORAL | Status: DC
Start: 2013-07-30 — End: 2013-07-31
  Administered 2013-07-30: 40 mg via ORAL
  Filled 2013-07-30 (×2): qty 1

## 2013-07-30 MED ORDER — HEPARIN SODIUM (PORCINE) 1000 UNIT/ML IJ SOLN
INTRAMUSCULAR | Status: AC
  Filled 2013-07-30: qty 1

## 2013-07-30 MED ORDER — AMLODIPINE BESYLATE 5 MG PO TABS
5.0000 mg | ORAL_TABLET | Freq: Every day | ORAL | Status: DC
  Administered 2013-07-30: 5 mg via ORAL
  Filled 2013-07-30 (×2): qty 1

## 2013-07-30 MED ORDER — LIDOCAINE HCL (PF) 1 % IJ SOLN
INTRAMUSCULAR | Status: AC
  Filled 2013-07-30: qty 60

## 2013-07-30 MED ORDER — SODIUM CHLORIDE 0.9 % IR SOLN
Status: DC
  Filled 2013-07-30: qty 2

## 2013-07-30 MED ORDER — PANTOPRAZOLE SODIUM 40 MG PO TBEC
40.0000 mg | DELAYED_RELEASE_TABLET | Freq: Every day | ORAL | Status: DC
  Administered 2013-07-30: 20:00:00 40 mg via ORAL
  Filled 2013-07-30: qty 1

## 2013-07-30 MED ORDER — ACETAMINOPHEN 325 MG PO TABS: 650.0000 mg | ORAL_TABLET | ORAL | Status: DC | PRN

## 2013-07-30 MED ORDER — AMIODARONE HCL 100 MG PO TABS: 100.0000 mg | ORAL_TABLET | Freq: Every day | ORAL | Status: DC

## 2013-07-30 MED ORDER — SODIUM CHLORIDE 0.9 % IV SOLN: 250.0000 mL | INTRAVENOUS | Status: DC | PRN

## 2013-07-30 MED ORDER — CEFAZOLIN SODIUM-DEXTROSE 2-3 GM-% IV SOLR
2.0000 g | Freq: Four times a day (QID) | INTRAVENOUS | Status: AC
  Administered 2013-07-30 – 2013-07-31 (×3): 2 g via INTRAVENOUS
  Filled 2013-07-30 (×3): qty 50

## 2013-07-30 NOTE — CV Procedure (Signed)
DDD PM leads removed and a new set of PPM leads inserted via the left subclavian vein. [id number].

## 2013-07-30 NOTE — Plan of Care (Signed)
Problem: Phase II Progression Outcomes Goal: Pacer site without bleeding/hematoma Outcome: Completed/Met Date Met:  07/30/13 Dressing upper left chest dry and intact without change. Goal: No evidence of pacemaker malfunction Outcome: Completed/Met Date Met:  07/30/13 Atrial pacing on the monitor with no noted pacer malfunction at this time Goal: Pain controlled Outcome: Completed/Met Date Met:  07/30/13 Patient has no complaints of pain at pacer site or other complaints of pain this evening

## 2013-07-30 NOTE — Interval H&P Note (Signed)
History and Physical Interval Note: since prior clinic visit,no change in the history, physical exam, assessment and plan.  07/30/2013 3:06 PM  [name] [name]  has presented today for surgery, with the diagnosis of Mechanical complications  The various methods of treatment have been discussed with the patient and family. After consideration of risks, benefits and other options for treatment, the patient has consented to  Procedure(s): LEAD REVISION (N/A) as a surgical intervention .  The patient's history has been reviewed, patient examined, no change in status, stable for surgery.  I have reviewed the patient's chart and labs.  Questions were answered to the patient's satisfaction.     [name] [name].D.

## 2013-07-30 NOTE — H&P (View-Only) (Signed)
    HPI [name] returns today for followup. He is a very pleasant 72-year-old man with a history of intermittent complete heart block and symptomatic bradycardia, status post permanent pacemaker insertion. In the interim, he has had no recurrent syncope. He does have fairly marked class III dyspnea. His wife was with him today states that he has no energy. Allergies  Allergen Reactions  . Codeine Nausea Only  . Diltiazem Hcl Itching and Rash  . Doxycycline Itching and Rash  . Infliximab Itching and Rash  . Mercaptopurine Itching and Rash  . Mesalamine Itching and Rash  . Methotrexate Itching and Rash  . Metronidazole Itching and Rash  . Pyridostigmine Bromide Nausea Only     Current Outpatient Prescriptions  Medication Sig Dispense Refill  . acetaminophen (TYLENOL) 325 MG tablet Take 650 mg by mouth every 6 (six) hours as needed for pain.      . adalimumab (HUMIRA PEN) 40 MG/0.8ML injection Inject 40 mg into the skin every 14 (fourteen) days. Every two weeks      . ALPRAZolam (XANAX) 0.5 MG tablet Take 0.5 mg by mouth at bedtime as needed for sleep.      . amiodarone (PACERONE) 200 MG tablet TAKE 1 TABLET ALTERNATING WITH 1/2 TABLET EVERY OTHER DAY.  30 tablet  6  . amLODipine (NORVASC) 5 MG tablet Take 1 tablet (5 mg total) by mouth daily.  30 tablet  6  . aspirin 81 MG tablet Take 81 mg by mouth daily.        . carvedilol (COREG) 3.125 MG tablet Take 1 tablet (3.125 mg total) by mouth 2 (two) times daily.  60 tablet  3  . dimenhyDRINATE (DRAMAMINE) 50 MG tablet Take 50 mg by mouth every 8 (eight) hours as needed (for dizziness).       . fluticasone (FLONASE) 50 MCG/ACT nasal spray 2 sprays by Nasal route daily.        . folic acid (FOLVITE) 1 MG tablet Take 1 tablet by mouth daily.       . loratadine (CLARITIN) 10 MG tablet 2 hrs before Humira       . losartan (COZAAR) 100 MG tablet Take 1 tablet (100 mg total) by mouth daily.  90 tablet  3  . Multiple Vitamin (MULTIVITAMIN)  tablet Take 1 tablet by mouth daily.        . nitroGLYCERIN (NITROSTAT) 0.4 MG SL tablet Take 1 tablet (0.4 mg total) by mouth as directed.  25 tablet  6  . omeprazole (PRILOSEC) 40 MG capsule TAKE 1 CAPSULE BY MOUTH DAILY.  30 capsule  1  . predniSONE (DELTASONE) 20 MG tablet Take 20 mg by mouth as needed. 30mg, 30mg,  two days prior to humira day of humira takes 20mg      . ranitidine (ZANTAC) 150 MG tablet 2 hrs before Humira       . simvastatin (ZOCOR) 40 MG tablet Take 1 tablet by mouth at bedtime.       . warfarin (COUMADIN) 5 MG tablet Take 5 mg by mouth daily. Takes 5mg Tuesdays, Thursdays, Sundays Takes 2.5mg Mondays, Wednesdays, Fridays, Saturdays       No current facility-administered medications for this visit.     Past Medical History  Diagnosis Date  . Syncope and collapse     Evaluation by Dr Klei; Syncope probably neutrally mediated and modest resting sinus bradycardia. and 16 B. episode of SVT that was either A fib or another type of   SVT. Patient improved with combination of holding ACE inhibitor or low BP and reducing beta blocker for bradycardia  . Hypertension                                                                                                                                                                                                                                                                                                                                                                                                                       . Coronary artery disease     relook cath 08/12/08 - all 5 grafts are patent  . Crohn's disease     Humara Rx in past  . SVT (supraventricular tachycardia)   . 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 History of benign colon tumor   . Sleep apnea     questionable  . Mitral regurgitation     mild - echo 4/09  . Anxiety   . Gait difficulty     with Crestor (but tolerates simva)  . Pulmonary embolism     2007,b CT  scan, IVC fiter placed then because of concern about coumadin use at that time.  . Orthostasis     treated  . Drug therapy     Intermittant steroids   . S/P IVC filter     2007  PE  . Warfarin anticoagulation   . Factor VIII     Factor VIII EXCESS...Dr Pandhit..another reason for coumadin  . Hx of CABG       2008  . Incomplete RBBB   . Drug therapy     question rash diltiazem, and amio  . Ejection fraction     EF 60-65%, echo, March, 2012, moderate diastolic dysfunction  . 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 Wert; has chronic shortness of breath  . Atrial fibrillation     April, 2013, new diagnosis  . Bradycardia     Sinus bradycardia, asymptomatic, April, 2013  . Carotid artery disease     Dopplers to be checked, June, 2013  . Cough     October, 2013  . Tremor     October, 2013    ROS:   All systems reviewed and negative except as noted in the HPI.   Past Surgical History  Procedure Laterality Date  . Coronary artery bypass graft  4/08  . Cholecystectomy    . Back surgery      x2  . Cataract extraction      x2  . Colonoscopy w/ polypectomy    . Insert / replace / remove pacemaker      [facility]  . Cardiac catheterization  8/14    [facility]  . Loop monitor       History reviewed. No pertinent family history.   History   Social History  . Marital Status: Married    Spouse Name: N/A    Number of Children: N/A  . Years of Education: N/A   Occupational History  . retired    Social History Main Topics  . Smoking status: Former Smoker    Types: Cigarettes    Quit date: 08/12/1961  . Smokeless tobacco: Never Used     Comment: smoked occ in his teenage yrs  . Alcohol Use: No  . Drug Use: No  . Sexual Activity: Not Currently   Other Topics Concern  . Not on file   Social History Narrative  . No narrative on file     BP 93/66  Pulse 60  Ht 5' 10" (1.778 m)  Wt 211 lb (95.709 kg)  BMI 30.28 kg/m2  Physical Exam:  Well appearing 72-year-old  man,NAD HEENT: Unremarkable Neck:  No JVD, no thyromegally Back:  No CVA tenderness Lungs:  Clear with no wheezes, rales, or rhonchi. Well-healed pacemaker incision. HEART:  Regular rate rhythm, no murmurs, no rubs, no clicks Abd:  soft, positive bowel sounds, no organomegally, no rebound, no guarding Ext:  2 plus pulses, no edema, no cyanosis, no clubbing Skin:  No rashes no nodules Neuro:  CN II through XII intact, motor grossly intact   DEVICE  Normal device function.  See PaceArt for details.   Assess/Plan: 

## 2013-07-31 ENCOUNTER — Ambulatory Visit (HOSPITAL_COMMUNITY): Payer: Medicare Other

## 2013-07-31 ENCOUNTER — Encounter (HOSPITAL_COMMUNITY): Payer: Self-pay | Admitting: Physician Assistant

## 2013-07-31 DIAGNOSIS — R001 Bradycardia, unspecified: Secondary | ICD-10-CM | POA: Diagnosis present

## 2013-07-31 DIAGNOSIS — T82198A Other mechanical complication of other cardiac electronic device, initial encounter: Secondary | ICD-10-CM

## 2013-07-31 LAB — PROTIME-INR: INR: 2.04 — ABNORMAL HIGH (ref 0.00–1.49)

## 2013-07-31 NOTE — Discharge Summary (Signed)
Discharge Summary   Patient ID: NICKALUS THORNSBERRY MRN: [id number], DOB/AGE: 1941-04-03 72 y.o. Admit date: 07/30/2013 D/C date:     07/31/2013  Primary Care Provider: Bosie Clos, MD Primary Cardiologist: Christ Kick  Primary Discharge Diagnoses:  1. Symptomatic bradycardia/intermittent complete heart block - interrogation in office revealed Biotronic pacemaker's atrial lead had dislodged and was sitting on tricuspid valve annulus with atrial pacing dysfunction - s/p lead removal and new lead insertion 07/30/13 - initial pacemaker placement 04/02/13 by Dr. Lewayne Bunting 2. Paroxysmal atrial fibrillation, on Coumadin  Secondary Discharge Diagnoses:  Past Medical History  Diagnosis Date  . Syncope and collapse     Evaluation by Dr Graciela Husbands; Syncope probably neutrally mediated and modest resting sinus bradycardia. and 16 B. episode of SVT that was either A fib or another type of SVT. Patient improved with combination of holding ACE inhibitor or low BP and reducing beta blocker for bradycardia  . Hypertension                                                                                                                                                                                                                                                                                                                                                                                                                       . Coronary artery disease     a. CABG 2008. b. Relook cath 08/12/08 - all 5 grafts are patent  . Crohn's disease     Humara Rx  in past  . SVT (supraventricular tachycardia)   . 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 History of benign colon tumor   . Mitral regurgitation     mild - echo 4/09  . Anxiety   . Gait difficulty     with Crestor (but tolerates simva)  . Pulmonary embolism     2007,b CT scan, IVC fiter placed then because of concern about coumadin use at that time.  .  Orthostasis     treated  . Drug therapy     Intermittant steroids   . Warfarin anticoagulation   . Factor VIII     Factor VIII EXCESS.Marland KitchenMarland KitchenDr Hampton Abbot.Marland Kitchenanother reason for coumadin  . Incomplete RBBB   . Drug therapy     question rash diltiazem, and amio  . Ejection fraction     EF 60-65%, echo, March, 2012, moderate diastolic dysfunction  . 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 Sherene Sires; has chronic shortness of breath  . PAF (paroxysmal atrial fibrillation)     April, 2013, new diagnosis  . Bradycardia     a. s/p Biotronic pacemaker placement 04/02/13. b. Lead removal/new lead insertion 07/2013 due to dysfunction.  . Carotid artery disease     Dopplers to be checked, June, 2013  . Cough     October, 2013  . Tremor     October, 2013  . High cholesterol   . OSA (obstructive sleep apnea)     "quit wearing his mask" (07/30/2013)  . GERD (gastroesophageal reflux disease)    Hospital Course: Mr. Thede is a 72 y/o M with history of afib, intermittent complete heart block and symptomatic bradycardia s/p pacemaker 03/2013, and CAD s/p CABG who presented to [facility] yesterday for planned lead revision. He was recently seen in the office c/o dyspnea. Interrogation of his Biotronik pacemaker demonstrated that his atrial lead had dislodged, and appeared to be sitting on the tricuspid valve annulus. This was based on the finding that he does have T waves and R waves on atrial sensing interrogation, and has ventricular capture when he is pacing the atrium. Dr. Ladona Ridgel suspected the etiology of his dyspnea was multifactorial. The patient was brought in yesterday and had DDD PM leads removed and a new set of PPM leads inserted via the left subclavian vein. He tolerated this procedure well. Dr. Ladona Ridgel has seen and examined the patient today and feels he is stable for discharge.  Discharge Vitals: Blood pressure 141/86, pulse 67, temperature 98.6 F (37 C), temperature source Oral, resp. rate 20, height 5'  10" (1.778 m), weight 209 lb 14.1 oz (95.2 kg), SpO2 94.00%.  Labs: Lab Results  Component Value Date   WBC 7.8 07/30/2013   HGB 14.6 07/30/2013   HCT 41.7 07/30/2013   MCV 91.6 07/30/2013   PLT 245 07/30/2013     Recent Labs Lab 07/30/13 1058  NA 134*  K 4.5  CL 99  CO2 26  BUN 15  CREATININE 1.06  CALCIUM 8.8  GLUCOSE 90    Diagnostic Studies/Procedures   Lead procedure 07/30/13  Dg Chest 2 View 07/31/2013   CLINICAL DATA:  Status post pacemaker placement.  EXAM: CHEST  2 VIEW  COMPARISON:  Chest x-ray of April 03, 2013  FINDINGS: The right hemidiaphragm is chronically elevated. The right lung is clear. On the left the lung is better inflated. There are coarse lung markings at the base which are stable. There is no pneumothorax. There  is no significant pleural effusion on the left. The cardiopericardial silhouette remains enlarged. The central pulmonary vascularity remains engorged. There is no definite interstitial edema. The patient has undergone previous CABG. Eight intact sternal wires are visible. An inferior vena caval filter is visible at the inferior margin of the film. There surgical clips in the right upper quadrant of the abdomen. The observed portions of the bony thorax exhibit no acute abnormalities.  IMPRESSION: 1. There is chronic elevation of the right hemidiaphragm. There is no evidence of pneumonia. There is likely low-grade compensated CHF. 2. There is no evidence of a postprocedure complication following placement of permanent pacemaker.   Electronically Signed   By: David  [place]   On: 07/31/2013 08:18    Discharge Medications     Medication List         acetaminophen 325 MG tablet  Commonly known as:  TYLENOL  Take 650 mg by mouth every 6 (six) hours as needed for pain.     ALPRAZolam 0.5 MG tablet  Commonly known as:  XANAX  Take 0.5 mg by mouth at bedtime as needed for sleep.     amiodarone 200 MG tablet  Commonly known as:  PACERONE  Take  100-200 mg by mouth daily. 200 mg daily alternating with 100 mg     amLODipine 5 MG tablet  Commonly known as:  NORVASC  Take 1 tablet (5 mg total) by mouth daily.     aspirin 81 MG tablet  Take 81 mg by mouth daily.     carvedilol 3.125 MG tablet  Commonly known as:  COREG  Take 1 tablet (3.125 mg total) by mouth 2 (two) times daily.     dimenhyDRINATE 50 MG tablet  Commonly known as:  DRAMAMINE  Take 50 mg by mouth every 8 (eight) hours as needed (for dizziness).     diphenhydrAMINE 25 mg capsule  Commonly known as:  BENADRYL  Take 25 mg by mouth See admin instructions. Take 25 mg 2 hours prior to Humira     fluticasone 50 MCG/ACT nasal spray  Commonly known as:  FLONASE  Place 2 sprays into the nose daily as needed for allergies.     folic acid 1 MG tablet  Commonly known as:  FOLVITE  Take 1 tablet by mouth daily.     HUMIRA PEN 40 MG/0.8ML injection  Generic drug:  adalimumab  Inject 40 mg into the skin every 14 (fourteen) days. Tuesday     losartan 100 MG tablet  Commonly known as:  COZAAR  Take 1 tablet (100 mg total) by mouth daily.     multivitamin tablet  Take 1 tablet by mouth daily.     nitroGLYCERIN 0.4 MG SL tablet  Commonly known as:  NITROSTAT  Take 1 tablet (0.4 mg total) by mouth as directed.     omeprazole 40 MG capsule  Commonly known as:  PRILOSEC  Take 40 mg by mouth daily.     simvastatin 40 MG tablet  Commonly known as:  ZOCOR  Take 1 tablet by mouth at bedtime.     warfarin 5 MG tablet  Commonly known as:  COUMADIN  Take 5 mg by mouth daily. 5 mg wednesdays and 2.5 mg all other days     ZANTAC 150 MG tablet  Generic drug:  ranitidine  Take 150 mg by mouth See admin instructions. Take 2 hrs before Humira        Disposition   The patient will be  discharged in stable condition to home. Discharge Orders   Future Appointments Provider Department Dept Phone   08/10/2013 9:00 AM [facility] Device 1 [facility] Office  [phone]   08/25/2013 10:30 AM [facility] Coumadin [facility] [place] [phone]   11/03/2013 9:15 AM Marinus Maw, MD [facility] [phone]   Future Orders Complete By Expires   Diet - low sodium heart healthy  As directed    Discharge instructions  As directed    Comments:     Please see attached sheet at the end of your After-Visit Summary for instructions on wound care, activity, and bathing.   Increase activity slowly  As directed      Follow-up Information   Follow up with [facility] On 08/10/2013. (At 9:00 AM for wound check)    Specialty:  Cardiology   Contact information:   [address] [place] [address] [phone]      Follow up with [facility] On 08/25/2013. (At 10:30 AM for Coumadin follow-up)    Specialty:  Cardiology   Contact information:   [address] [place] [address] [phone]      Follow up with Lewayne Bunting, MD On 11/03/2013. (At 9:15 AM)    Specialty:  Cardiology   Contact information:   [address] [place] [address] [phone]         Duration of Discharge Encounter: Greater than 30 minutes including physician and PA time.  Signed, Kriste Basque Gustav Knueppel PA-C 07/31/2013, 8:40 AM

## 2013-07-31 NOTE — Op Note (Signed)
NAMECIAN, COSTANZO NO.:  [id number]  MEDICAL RECORD NO.:  [id number]  LOCATION:  6C05C                        FACILITY:  [facility]  PHYSICIAN:  Doylene Canning. Ladona Ridgel, MD    DATE OF BIRTH:  Dec 26, 1940  DATE OF PROCEDURE:  07/30/2013 DATE OF DISCHARGE:  07/31/2013                              OPERATIVE REPORT   PROCEDURE PERFORMED:  Revision of both an atrial and ventricular pacing lead.  INTRODUCTION:  The patient is a 72 year old male with symptomatic bradycardia who underwent permanent pacemaker insertion approximately 4 months ago.  At follow up, he was found to have atrial lead malfunction subsequently secondary to dislodgement of the atrial lead.  His initial evaluation of the right ventricular lead was satisfactory; however, on today's evaluation, his R-waves were 4 and our plan today is to remove both atrial and ventricular pacing leads and insert 2 new leads.  DESCRIPTION OF PROCEDURE:  After informed was obtained, the patient was taken to the diagnostic EP lab in a fasting state.  After usual preparation and draping, intravenous fentanyl and midazolam was given for sedation.  A 30 mL of lidocaine was infiltrated into the left infraclavicular region.  A 5-cm incision was carried out over this region.  Electrocautery was utilized to dissect down to the pacemaker pocket.  The pocket was opened with electrocautery and the fibrous adhesions were freed up around the leads.  The leads were disconnected from the generator and the generator was placed in antibiotic solution. A 52 and 60 cm stylets were advanced into the atrial and ventricular lead.  The helix on the leads were retracted and the dense fibrous scar tissue around lead was dissected free.  The ventricular lead was targeted first for removal.  It was in with more difficulty than I thought in terms of removal.  A gentle traction was placed on the lead and after approximately 4 minutes of gentle traction,  the lead freed up and came out in total.  Next, the atrial lead was targeted and after the helix was retracted from the lead into the body of the lead, the lead was again put in traction and after approximately 3-4 minutes of gentle traction, the lead was removed in total without any hemodynamic sequelae.  The new atrial and ventricular leads were inserted by way of the left subclavian vein, and these were Medtronic leads.  The ventricular lead was 5076, 58 cm lead, serial number [id number], and the atrial lead was a Medtronic model 5076, 52 cm active fixation pacing lead, serial number [id number].  Mapping was first carried out in the right ventricle and at the final site, the R-waves measured 15 mV.  The pacing impedance was 970 ohms and the threshold was a V at 0.5 milliseconds.  A 10 V pacing did not stimulate the diaphragm.  With the right ventricular lead in satisfactory position, attention was then turned to placement of the atrial lead which was placed in anterolateral portion of the right atrium where P-waves measured 3 mV.  The pacing impedance was 550 ohms and a pacing threshold was 0.6 V at 0.4 milliseconds.  With these satisfactory parameters and with 10 V  pacing not demonstrating diaphragmatic capture, the leads were secured to the subpectoral fascia with figure-of-eight silk suture and the sewing sleeve was secured with silk suture.  At this point, the pocket was irrigated with copious amounts of antibiotic irrigation and the new atrial and ventricular leads were hooked up to the previously implanted Biotronik dual-chamber pacing pacemaker and placed back in the subcutaneous pocket.  The pocket was again irrigated with antibiotic irrigation, and the incision was closed with 2-0 and 3-0 Vicryl. Benzoin and Steri-Strips were painted on the skin and pressure dressing applied, and the patient was returned to his room in satisfactory condition.  COMPLICATIONS:  There were no  immediate procedure complications.  RESULTS:  Demonstrate successful removal of a previously implanted atrial and ventricular leads with the atrial lead having dislodged followed by insertion of new atrial and ventricular pacing lead without immediate procedure complications.     Doylene Canning. Ladona Ridgel, MD     GWT/MEDQ  D:  07/30/2013  T:  07/31/2013  Job:  [id number]

## 2013-08-10 ENCOUNTER — Ambulatory Visit (INDEPENDENT_AMBULATORY_CARE_PROVIDER_SITE_OTHER): Payer: Medicare Other | Admitting: *Deleted

## 2013-08-10 ENCOUNTER — Encounter: Payer: Self-pay | Admitting: Internal Medicine

## 2013-08-10 DIAGNOSIS — I498 Other specified cardiac arrhythmias: Secondary | ICD-10-CM

## 2013-08-10 DIAGNOSIS — R001 Bradycardia, unspecified: Secondary | ICD-10-CM

## 2013-08-10 DIAGNOSIS — I4891 Unspecified atrial fibrillation: Secondary | ICD-10-CM

## 2013-08-10 DIAGNOSIS — Z950 Presence of cardiac pacemaker: Secondary | ICD-10-CM

## 2013-08-10 LAB — MDC_IDC_ENUM_SESS_TYPE_INCLINIC
Brady Statistic RA Percent Paced: 25 %
Brady Statistic RV Percent Paced: 0 %
Lead Channel Pacing Threshold Amplitude: 0.8 V
Lead Channel Pacing Threshold Amplitude: 0.9 V
Lead Channel Sensing Intrinsic Amplitude: 5.4 mV

## 2013-08-10 NOTE — Progress Notes (Signed)
Pt seen in device clinic for follow up of recently implanted pacemaker.  Wound well healed.  No redness, swelling, or edema.  Steri-strips removed today.   Device interrogated and found to be functioning normally.  Turned on CLS and auto capture for both leads. See PaceArt for full details.  Pt has SOB & fatigue prior to device implant; wheezing also noticed when I had him walk 1 flight of stairs for rate response confirmation.   Pt to follow up with Dr. Ladona Ridgel 11/03/13 @ 9:15.   [name] [name] 08/10/2013 9:52 AM

## 2013-08-25 ENCOUNTER — Ambulatory Visit (INDEPENDENT_AMBULATORY_CARE_PROVIDER_SITE_OTHER): Payer: Medicare Other

## 2013-08-25 DIAGNOSIS — Z7901 Long term (current) use of anticoagulants: Secondary | ICD-10-CM

## 2013-08-25 DIAGNOSIS — I2699 Other pulmonary embolism without acute cor pulmonale: Secondary | ICD-10-CM

## 2013-08-25 DIAGNOSIS — D66 Hereditary factor VIII deficiency: Secondary | ICD-10-CM

## 2013-08-25 LAB — POCT INR: INR: 1.9

## 2013-08-25 NOTE — Telephone Encounter (Signed)
Pt saw Dr [name] in office on 06/09/13. Closing encounter.

## 2013-09-15 ENCOUNTER — Other Ambulatory Visit: Payer: Self-pay | Admitting: Physician Assistant

## 2013-09-18 IMAGING — CR DG CHEST 1V PORT
1 series · 2 of 2 positions shown · non-contrast
Comparison: none

REASON FOR EXAM: Chest Pain
COMMENTS:

[Series 1: ap · 0.17mm/px · 2 of 2 slices shown]
[im 1/2]
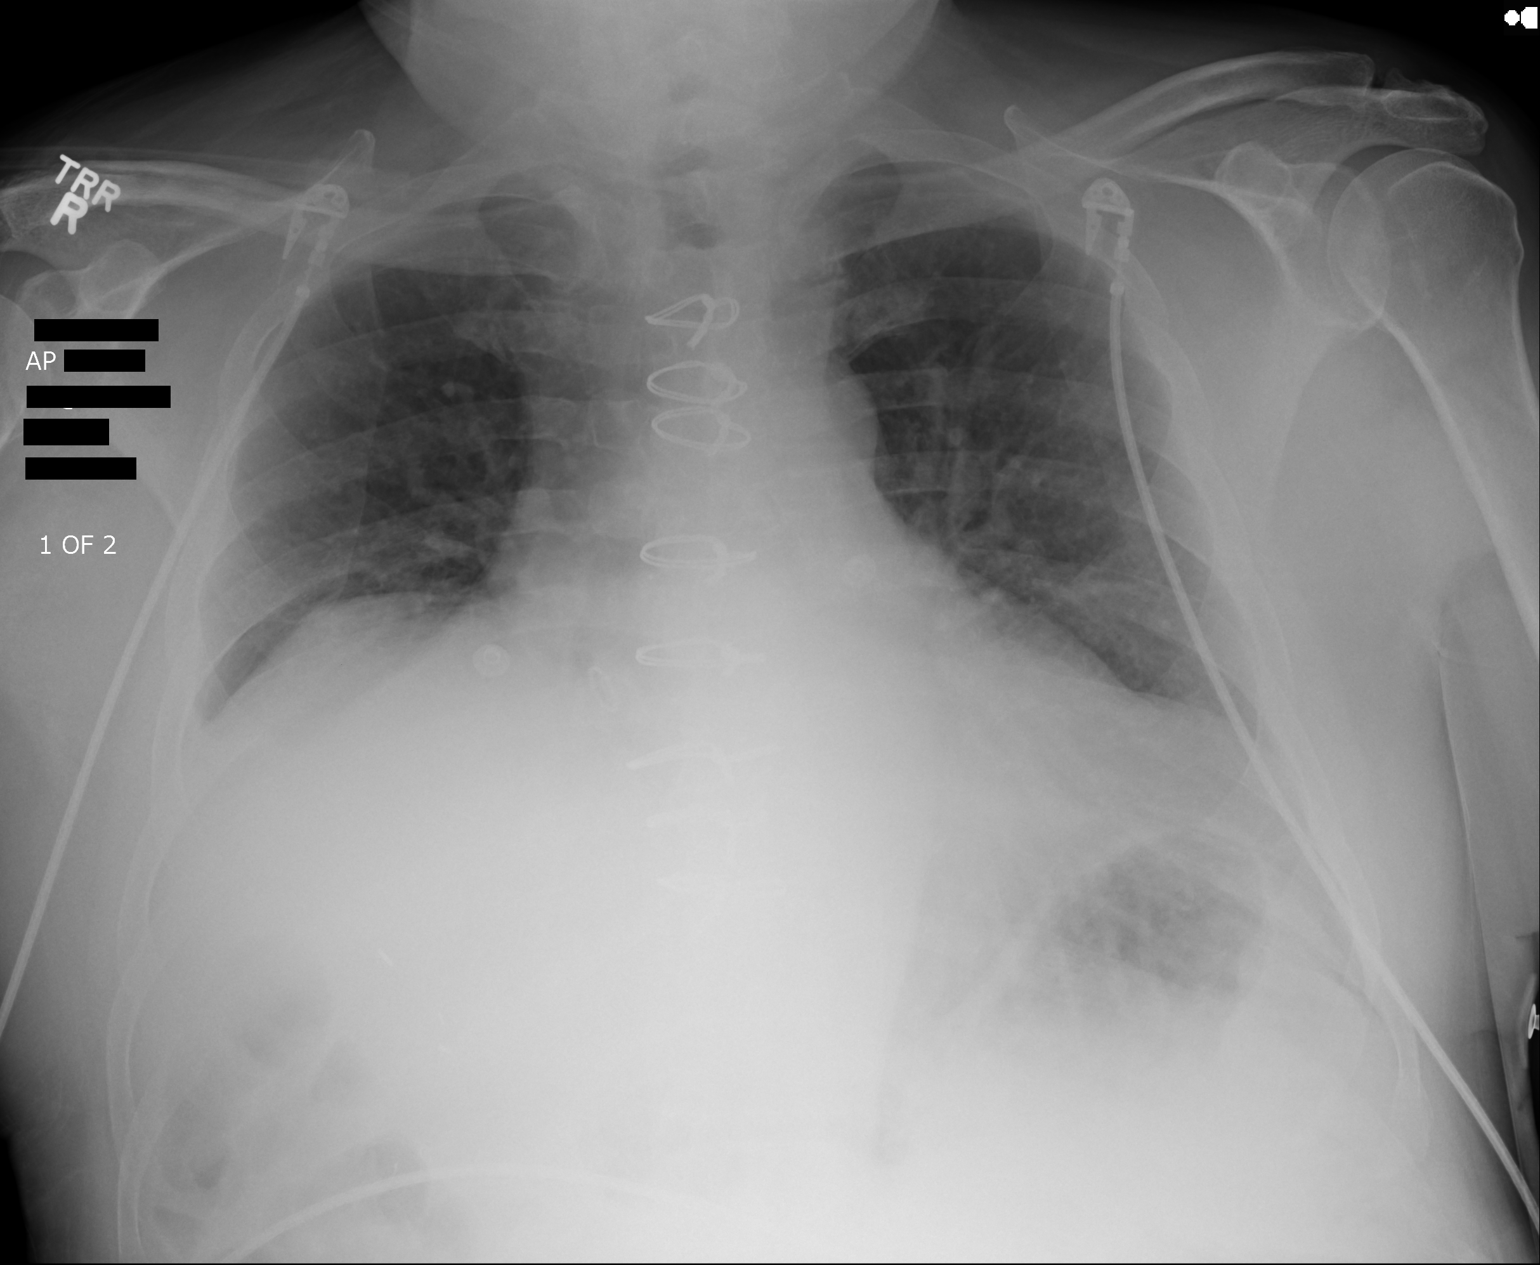
[im 2/2]
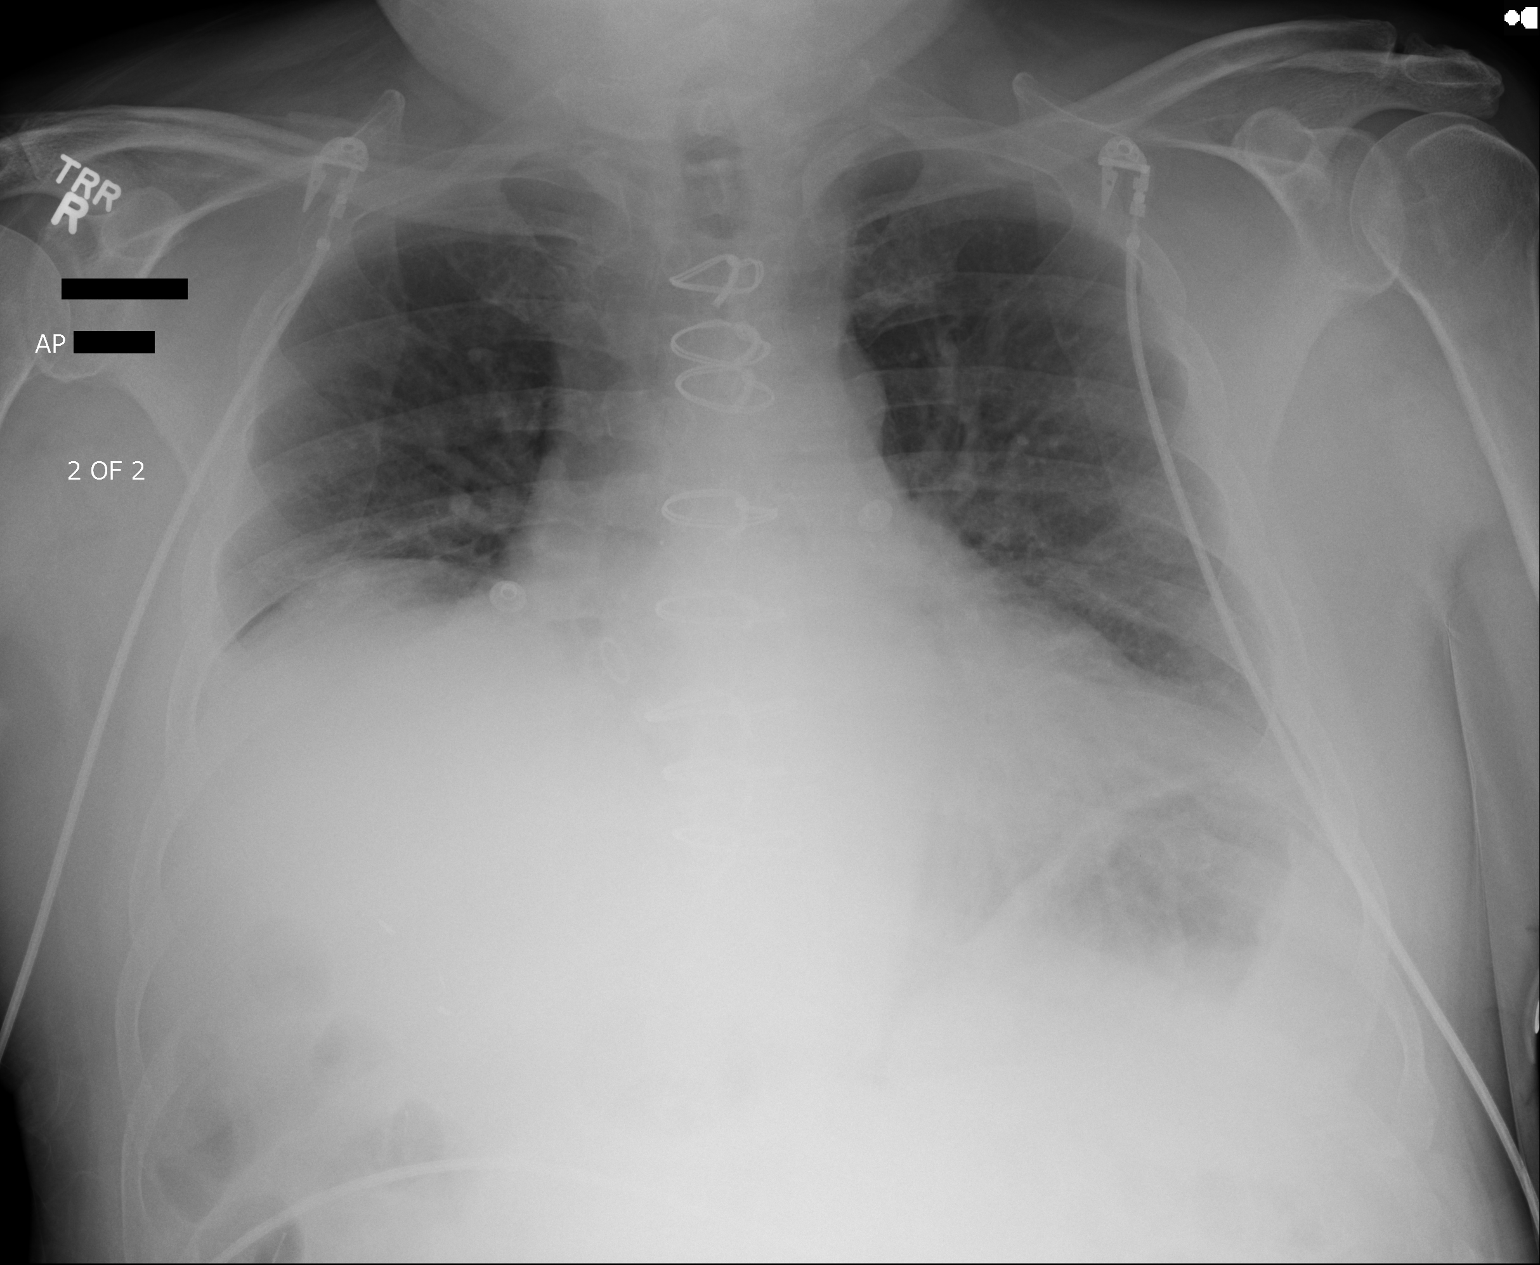

[2 of 2 positions shown; findings below may reference images not displayed]

PROCEDURE:     DXR - DXR PORTABLE CHEST SINGLE VIEW  - March 20, 2013  [DATE]

RESULT:     Comparison is made to study October 17, 2012.

The lung volumes are low especially on the right. This is a stable finding.
Images were repeated with no apparent improvement in inspiratory effort. The
cardiac silhouette is enlarged. The patient has undergone previous median
sternotomy. The central pulmonary vascularity is prominent.
IMPRESSION: The study is limited due to hypoinflation.  There are
findings that suggest low-grade CHF with mild pulmonary vascular congestion.

[REDACTED]

## 2013-09-22 ENCOUNTER — Ambulatory Visit (INDEPENDENT_AMBULATORY_CARE_PROVIDER_SITE_OTHER): Payer: Medicare Other | Admitting: Pharmacist

## 2013-09-22 DIAGNOSIS — Z5181 Encounter for therapeutic drug level monitoring: Secondary | ICD-10-CM

## 2013-09-22 DIAGNOSIS — I2699 Other pulmonary embolism without acute cor pulmonale: Secondary | ICD-10-CM

## 2013-09-22 DIAGNOSIS — D66 Hereditary factor VIII deficiency: Secondary | ICD-10-CM

## 2013-09-22 DIAGNOSIS — Z7901 Long term (current) use of anticoagulants: Secondary | ICD-10-CM

## 2013-09-22 LAB — POCT INR: INR: 2

## 2013-09-30 ENCOUNTER — Other Ambulatory Visit: Payer: Self-pay | Admitting: Internal Medicine

## 2013-10-19 ENCOUNTER — Other Ambulatory Visit: Payer: Self-pay | Admitting: Internal Medicine

## 2013-10-20 ENCOUNTER — Ambulatory Visit (INDEPENDENT_AMBULATORY_CARE_PROVIDER_SITE_OTHER): Payer: Medicare Other

## 2013-10-20 DIAGNOSIS — Z5181 Encounter for therapeutic drug level monitoring: Secondary | ICD-10-CM

## 2013-10-20 DIAGNOSIS — Z7901 Long term (current) use of anticoagulants: Secondary | ICD-10-CM

## 2013-10-20 DIAGNOSIS — D66 Hereditary factor VIII deficiency: Secondary | ICD-10-CM

## 2013-10-20 DIAGNOSIS — I2699 Other pulmonary embolism without acute cor pulmonale: Secondary | ICD-10-CM

## 2013-10-20 LAB — POCT INR: INR: 1.9

## 2013-10-24 ENCOUNTER — Inpatient Hospital Stay: Payer: Self-pay | Admitting: Student

## 2013-10-24 LAB — CBC
HCT: 44.1 % (ref 40.0–52.0)
HGB: 14.9 g/dL (ref 13.0–18.0)
MCH: 32.2 pg (ref 26.0–34.0)
MCHC: 33.7 g/dL (ref 32.0–36.0)
MCV: 95 fL (ref 80–100)
Platelet: 248 10*3/uL (ref 150–440)
RBC: 4.62 10*6/uL (ref 4.40–5.90)
RDW: 16.1 % — ABNORMAL HIGH (ref 11.5–14.5)
WBC: 12.7 10*3/uL — ABNORMAL HIGH (ref 3.8–10.6)

## 2013-10-24 LAB — COMPREHENSIVE METABOLIC PANEL
ALBUMIN: 3.6 g/dL (ref 3.4–5.0)
ANION GAP: 6 — AB (ref 7–16)
Alkaline Phosphatase: 95 U/L
BUN: 43 mg/dL — ABNORMAL HIGH (ref 7–18)
Bilirubin,Total: 1.3 mg/dL — ABNORMAL HIGH (ref 0.2–1.0)
CALCIUM: 8.4 mg/dL — AB (ref 8.5–10.1)
Chloride: 104 mmol/L (ref 98–107)
Co2: 23 mmol/L (ref 21–32)
Creatinine: 2.6 mg/dL — ABNORMAL HIGH (ref 0.60–1.30)
EGFR (Non-African Amer.): 23 — ABNORMAL LOW
GFR CALC AF AMER: 27 — AB
GLUCOSE: 108 mg/dL — AB (ref 65–99)
Osmolality: 278 (ref 275–301)
POTASSIUM: 4 mmol/L (ref 3.5–5.1)
SGOT(AST): 113 U/L — ABNORMAL HIGH (ref 15–37)
SGPT (ALT): 146 U/L — ABNORMAL HIGH (ref 12–78)
Sodium: 133 mmol/L — ABNORMAL LOW (ref 136–145)
TOTAL PROTEIN: 7.4 g/dL (ref 6.4–8.2)

## 2013-10-24 LAB — PROTIME-INR
INR: 2.4
PROTHROMBIN TIME: 25.7 s — AB (ref 11.5–14.7)

## 2013-10-24 LAB — TROPONIN I

## 2013-10-25 LAB — BASIC METABOLIC PANEL
ANION GAP: 7 (ref 7–16)
BUN: 43 mg/dL — ABNORMAL HIGH (ref 7–18)
CHLORIDE: 106 mmol/L (ref 98–107)
CO2: 21 mmol/L (ref 21–32)
CREATININE: 1.86 mg/dL — AB (ref 0.60–1.30)
Calcium, Total: 8 mg/dL — ABNORMAL LOW (ref 8.5–10.1)
GFR CALC AF AMER: 40 — AB
GFR CALC NON AF AMER: 35 — AB
Glucose: 76 mg/dL (ref 65–99)
Osmolality: 278 (ref 275–301)
Potassium: 3.9 mmol/L (ref 3.5–5.1)
SODIUM: 134 mmol/L — AB (ref 136–145)

## 2013-10-25 LAB — SEDIMENTATION RATE: ERYTHROCYTE SED RATE: 14 mm/h (ref 0–20)

## 2013-10-25 LAB — CBC WITH DIFFERENTIAL/PLATELET
Basophil #: 0 10*3/uL (ref 0.0–0.1)
Basophil %: 0.3 %
EOS PCT: 1.2 %
Eosinophil #: 0.1 10*3/uL (ref 0.0–0.7)
HCT: 39.3 % — ABNORMAL LOW (ref 40.0–52.0)
HGB: 13.4 g/dL (ref 13.0–18.0)
LYMPHS ABS: 1.5 10*3/uL (ref 1.0–3.6)
Lymphocyte %: 17.2 %
MCH: 32.6 pg (ref 26.0–34.0)
MCHC: 34.1 g/dL (ref 32.0–36.0)
MCV: 96 fL (ref 80–100)
Monocyte #: 1.5 x10 3/mm — ABNORMAL HIGH (ref 0.2–1.0)
Monocyte %: 17.9 %
NEUTROS ABS: 5.4 10*3/uL (ref 1.4–6.5)
NEUTROS PCT: 63.4 %
Platelet: 221 10*3/uL (ref 150–440)
RBC: 4.11 10*6/uL — ABNORMAL LOW (ref 4.40–5.90)
RDW: 16 % — ABNORMAL HIGH (ref 11.5–14.5)
WBC: 8.5 10*3/uL (ref 3.8–10.6)

## 2013-10-25 LAB — PROTIME-INR
INR: 2.8
PROTHROMBIN TIME: 29 s — AB (ref 11.5–14.7)

## 2013-10-25 LAB — MAGNESIUM: Magnesium: 2.1 mg/dL

## 2013-10-26 LAB — BASIC METABOLIC PANEL
Anion Gap: 6 — ABNORMAL LOW (ref 7–16)
BUN: 24 mg/dL — ABNORMAL HIGH (ref 7–18)
CALCIUM: 7.9 mg/dL — AB (ref 8.5–10.1)
Chloride: 105 mmol/L (ref 98–107)
Co2: 23 mmol/L (ref 21–32)
Creatinine: 1.31 mg/dL — ABNORMAL HIGH (ref 0.60–1.30)
EGFR (Non-African Amer.): 53 — ABNORMAL LOW
GLUCOSE: 73 mg/dL (ref 65–99)
Osmolality: 271 (ref 275–301)
Potassium: 3.7 mmol/L (ref 3.5–5.1)
SODIUM: 134 mmol/L — AB (ref 136–145)

## 2013-10-26 LAB — MAGNESIUM: Magnesium: 2.1 mg/dL

## 2013-10-27 LAB — BASIC METABOLIC PANEL
ANION GAP: 5 — AB (ref 7–16)
BUN: 14 mg/dL (ref 7–18)
CHLORIDE: 106 mmol/L (ref 98–107)
CO2: 25 mmol/L (ref 21–32)
Calcium, Total: 8.1 mg/dL — ABNORMAL LOW (ref 8.5–10.1)
Creatinine: 1.03 mg/dL (ref 0.60–1.30)
EGFR (African American): >60
EGFR (Non-African Amer.): >60
Glucose: 86 mg/dL (ref 65–99)
Osmolality: 272 (ref 275–301)
POTASSIUM: 3.6 mmol/L (ref 3.5–5.1)
Sodium: 136 mmol/L (ref 136–145)

## 2013-10-27 LAB — PROTIME-INR
INR: 4.3
PROTHROMBIN TIME: 39.7 s — AB (ref 11.5–14.7)

## 2013-10-29 ENCOUNTER — Ambulatory Visit (INDEPENDENT_AMBULATORY_CARE_PROVIDER_SITE_OTHER): Payer: Medicare Other

## 2013-10-29 DIAGNOSIS — Z7901 Long term (current) use of anticoagulants: Secondary | ICD-10-CM

## 2013-10-29 DIAGNOSIS — D66 Hereditary factor VIII deficiency: Secondary | ICD-10-CM

## 2013-10-29 DIAGNOSIS — I2699 Other pulmonary embolism without acute cor pulmonale: Secondary | ICD-10-CM

## 2013-10-29 DIAGNOSIS — Z5181 Encounter for therapeutic drug level monitoring: Secondary | ICD-10-CM

## 2013-10-29 LAB — POCT INR: INR: 4.4

## 2013-10-29 LAB — STOOL CULTURE

## 2013-11-03 ENCOUNTER — Ambulatory Visit (INDEPENDENT_AMBULATORY_CARE_PROVIDER_SITE_OTHER): Payer: Medicare Other | Admitting: *Deleted

## 2013-11-03 ENCOUNTER — Encounter: Payer: Self-pay | Admitting: Internal Medicine

## 2013-11-03 ENCOUNTER — Ambulatory Visit (INDEPENDENT_AMBULATORY_CARE_PROVIDER_SITE_OTHER): Payer: Medicare Other | Admitting: Internal Medicine

## 2013-11-03 VITALS — BP 118/77 | HR 92 | Ht 70.0 in | Wt 210.1 lb

## 2013-11-03 DIAGNOSIS — Z7901 Long term (current) use of anticoagulants: Secondary | ICD-10-CM

## 2013-11-03 DIAGNOSIS — I10 Essential (primary) hypertension: Secondary | ICD-10-CM

## 2013-11-03 DIAGNOSIS — Z950 Presence of cardiac pacemaker: Secondary | ICD-10-CM

## 2013-11-03 DIAGNOSIS — I4891 Unspecified atrial fibrillation: Secondary | ICD-10-CM

## 2013-11-03 DIAGNOSIS — D66 Hereditary factor VIII deficiency: Secondary | ICD-10-CM

## 2013-11-03 DIAGNOSIS — I2699 Other pulmonary embolism without acute cor pulmonale: Secondary | ICD-10-CM

## 2013-11-03 DIAGNOSIS — Z5181 Encounter for therapeutic drug level monitoring: Secondary | ICD-10-CM

## 2013-11-03 DIAGNOSIS — R001 Bradycardia, unspecified: Secondary | ICD-10-CM

## 2013-11-03 DIAGNOSIS — I498 Other specified cardiac arrhythmias: Secondary | ICD-10-CM

## 2013-11-03 LAB — MDC_IDC_ENUM_SESS_TYPE_INCLINIC
Brady Statistic RA Percent Paced: 85 %
Date Time Interrogation Session: 20150325093932
Implantable Pulse Generator Model: 359529
Implantable Pulse Generator Serial Number: 68066723
Lead Channel Pacing Threshold Amplitude: 0.7 V
Lead Channel Pacing Threshold Amplitude: 0.7 V
Lead Channel Pacing Threshold Amplitude: 0.7 V
Lead Channel Pacing Threshold Amplitude: 0.8 V
Lead Channel Pacing Threshold Amplitude: 0.8 V
Lead Channel Pacing Threshold Pulse Width: 0.4 ms
Lead Channel Pacing Threshold Pulse Width: 0.4 ms
Lead Channel Pacing Threshold Pulse Width: 0.4 ms
Lead Channel Pacing Threshold Pulse Width: 0.4 ms
Lead Channel Pacing Threshold Pulse Width: 0.4 ms
Lead Channel Sensing Intrinsic Amplitude: 12.6 mV
Lead Channel Sensing Intrinsic Amplitude: 4 mV
Lead Channel Setting Pacing Amplitude: 1.2 V
Lead Channel Setting Pacing Amplitude: 1.8 V
Lead Channel Setting Pacing Pulse Width: 0.4 ms
MDC IDC MSMT LEADCHNL RA IMPEDANCE VALUE: 526 Ohm
MDC IDC MSMT LEADCHNL RA SENSING INTR AMPL: 4 mV
MDC IDC MSMT LEADCHNL RV IMPEDANCE VALUE: 565 Ohm
MDC IDC MSMT LEADCHNL RV SENSING INTR AMPL: 12.6 mV
MDC IDC STAT BRADY RV PERCENT PACED: 0 %

## 2013-11-03 LAB — POCT INR: INR: 2

## 2013-11-03 NOTE — Patient Instructions (Signed)
Your physician wants you to follow-up in: 9 months with Dr Knox Saliva will receive a reminder letter in the mail two months in advance. If you don't receive a letter, please call our office to schedule the follow-up appointment.   Remote monitoring is used to monitor your Pacemaker of ICD from home. This monitoring reduces the number of office visits required to check your device to one time per year. It allows [place] to keep an eye on the functioning of your device to ensure it is working properly. You are scheduled for a device check from home on 02/07/14. You may send your transmission at any time that day. If you have a wireless device, the transmission will be sent automatically. After your physician reviews your transmission, you will receive a postcard with your next transmission date.

## 2013-11-03 NOTE — Progress Notes (Signed)
HPI [name] [name] returns today for followup. He is a pleasant 73 yo man with a h/o symptomatic bradycardia, s/p PPM insertion who developed an atrial lead dislodgement and was also found to have reduce R waves. He has undergone PPM lead revisions. Since his surgery approximately 3 months ago, he has done well except for an admission a couple of weeks ago with food poisoning and an ileus an renal failure, all of which has improved. He denies chest pain or sob. No syncope or edema. Allergies  Allergen Reactions  . Codeine Nausea Only  . Diltiazem Hcl Itching and Rash  . Doxycycline Itching and Rash  . Infliximab Itching and Rash  . Mercaptopurine Itching and Rash  . Mesalamine Itching and Rash  . Methotrexate Itching and Rash  . Metronidazole Itching and Rash  . Pyridostigmine Bromide Nausea Only     Current Outpatient Prescriptions  Medication Sig Dispense Refill  . acetaminophen (TYLENOL) 325 MG tablet Take 650 mg by mouth every 6 (six) hours as needed for pain.      Marland Kitchen adalimumab (HUMIRA PEN) 40 MG/0.8ML injection Inject 40 mg into the skin every 14 (fourteen) days. Tuesday      . ALPRAZolam (XANAX) 0.5 MG tablet Take 0.5 mg by mouth at bedtime as needed for sleep.      Marland Kitchen amiodarone (PACERONE) 200 MG tablet Take 100-200 mg by mouth daily. 200 mg daily alternating with 100 mg      . amLODipine (NORVASC) 5 MG tablet Take 1 tablet (5 mg total) by mouth daily.  30 tablet  6  . aspirin 81 MG tablet Take 81 mg by mouth daily.        . carvedilol (COREG) 3.125 MG tablet TAKE 1 TABLET (3.125 MG TOTAL) BY MOUTH 2 (TWO) TIMES DAILY.  60 tablet  2  . dimenhyDRINATE (DRAMAMINE) 50 MG tablet Take 50 mg by mouth every 8 (eight) hours as needed (for dizziness).       . diphenhydrAMINE (BENADRYL) 25 mg capsule Take 25 mg by mouth See admin instructions. Take 25 mg 2 hours prior to Humira      . fluticasone (FLONASE) 50 MCG/ACT nasal spray Place 2 sprays into the nose daily as needed for allergies.        . folic acid (FOLVITE) 1 MG tablet Take 1 tablet by mouth daily.       Marland Kitchen losartan (COZAAR) 100 MG tablet TAKE 1 TABLET BY MOUTH EVERY DAY  90 tablet  3  . Multiple Vitamin (MULTIVITAMIN) tablet Take 1 tablet by mouth daily.        . nitroGLYCERIN (NITROSTAT) 0.4 MG SL tablet Take 1 tablet (0.4 mg total) by mouth as directed.  25 tablet  6  . omeprazole (PRILOSEC) 40 MG capsule Take 40 mg by mouth daily.      . polyethylene glycol powder (GLYCOLAX/MIRALAX) powder daily.      . ranitidine (ZANTAC) 150 MG tablet Take 150 mg by mouth See admin instructions. Take 2 hrs before Humira      . simvastatin (ZOCOR) 40 MG tablet Take 1 tablet by mouth at bedtime.       Marland Kitchen warfarin (COUMADIN) 5 MG tablet Take 5 mg by mouth daily. 5 mg wednesdays and 2.5 mg all other days       No current facility-administered medications for this visit.     Past Medical History  Diagnosis Date  . Syncope and collapse     Evaluation  by Dr Caryl Comes; Syncope probably neutrally mediated and modest resting sinus bradycardia. and 16 B. episode of SVT that was either A fib or another type of SVT. Patient improved with combination of holding ACE inhibitor or low BP and reducing beta blocker for bradycardia  . Hypertension                                                                                                                                                                                                                                                                                                                                                                                                                       . Coronary artery disease     a. CABG 2008. b. Relook cath 08/12/08 - all 5 grafts are patent  . Crohn's disease     Humara Rx in past  . SVT (supraventricular tachycardia)   . 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 History of benign colon tumor   . Mitral regurgitation     mild - echo 4/09  . Anxiety   .  Gait difficulty     with Crestor (but tolerates simva)  . Pulmonary embolism     2007,b CT scan, IVC fiter placed then because of concern about coumadin use at that time.  . Orthostasis     treated  . Drug therapy     Intermittant steroids   . Warfarin anticoagulation   . Factor VIII     Factor VIII  EXCESS.Marland KitchenMarland KitchenDr Gwenlyn Fudge.Marland Kitchenanother reason for coumadin  . Incomplete RBBB   . Drug therapy     question rash diltiazem, and amio  . Ejection fraction     EF 60-65%, echo, March, 2012, moderate diastolic dysfunction  . 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 Melvyn Novas; has chronic shortness of breath  . PAF (paroxysmal atrial fibrillation)     April, 2013, new diagnosis  . Bradycardia     a. s/p Biotronic pacemaker placement 04/02/13. b. Lead removal/new lead insertion 07/2013 due to dysfunction.  . Carotid artery disease     Dopplers to be checked, June, 2013  . Cough     October, 2013  . Tremor     October, 2013  . High cholesterol   . OSA (obstructive sleep apnea)     "quit wearing his mask" (07/30/2013)  . GERD (gastroesophageal reflux disease)     ROS:   All systems reviewed and negative except as noted in the HPI.   Past Surgical History  Procedure Laterality Date  . Cholecystectomy    . Back surgery    . Cataract extraction w/ intraocular lens  implant, bilateral Bilateral 1990's  . Colonoscopy w/ polypectomy    . Loop monitor  03/2013    "removed w/in 2 days" (07/30/2013)  . Lead revision  07/30/2013    DDD PM leads removed and a new set of PPM leads inserted via the left subclavian vein. [id number]  . Tonsillectomy    . Insert / replace / remove pacemaker  03/2013    [facility]  . Cardiac catheterization  03/2013    [facility]  . Cardiac catheterization  2008; ~ 2011  . Coronary artery bypass graft  4/08    CABG X5  . Lumbar disc surgery  1978; 1986  . Colon surgery      "had a tumor removed" (07/30/2013)  . Vena cava filter placement  2007    PE     History reviewed. No pertinent  family history.   History   Social History  . Marital Status: Married    Spouse Name: N/A    Number of Children: N/A  . Years of Education: N/A   Occupational History  . retired    Social History Main Topics  . Smoking status: Former Smoker -- 0.25 packs/day for 5 years    Types: Cigarettes  . Smokeless tobacco: Never Used     Comment: 07/30/2013 "stopped smoking ~ 1963"  . Alcohol Use: No  . Drug Use: No  . Sexual Activity: Not Currently   Other Topics Concern  . Not on file   Social History Narrative  . No narrative on file     BP 118/77  Pulse 92  Ht 5\' 10"  (1.778 m)  Wt 210 lb 1.9 oz (95.31 kg)  BMI 30.15 kg/m2  Physical Exam:  Well appearing 73 yo man, NAD HEENT: Unremarkable Neck:  No JVD, no thyromegally Back:  No CVA tenderness Lungs:  Clear with no wheezes, rales, or rhonchi HEART:  Regular rate rhythm, no murmurs, no rubs, no clicks Abd:  soft, positive bowel sounds, no organomegally, no rebound, no guarding Ext:  2 plus pulses, no edema, no cyanosis, no clubbing Skin:  No rashes no nodules Neuro:  CN II through XII intact, motor grossly intact   DEVICE  Normal device function.  See PaceArt for details.   Assess/Plan:

## 2013-11-03 NOTE — Assessment & Plan Note (Signed)
PM interogation demonstrates that he is maintaining NSR except for a very few brief episodes. He will continue his current meds.

## 2013-11-03 NOTE — Assessment & Plan Note (Signed)
His biotronik DDD PM is working normally. Will recheck in several months.

## 2013-11-03 NOTE — Assessment & Plan Note (Signed)
His blood pressure is well controlled. He will continue his current meds. 

## 2013-11-05 IMAGING — US US EXTREM LOW VENOUS*R*
1 series · 14 of 24 positions shown · non-contrast
Comparison: none

REASON FOR EXAM: swelling and pain with hx of clots
COMMENTS:

[Series 1: us extrem low venous*right* · 0.16mm/px · 14 of 52 slices shown]
[im 1/52]
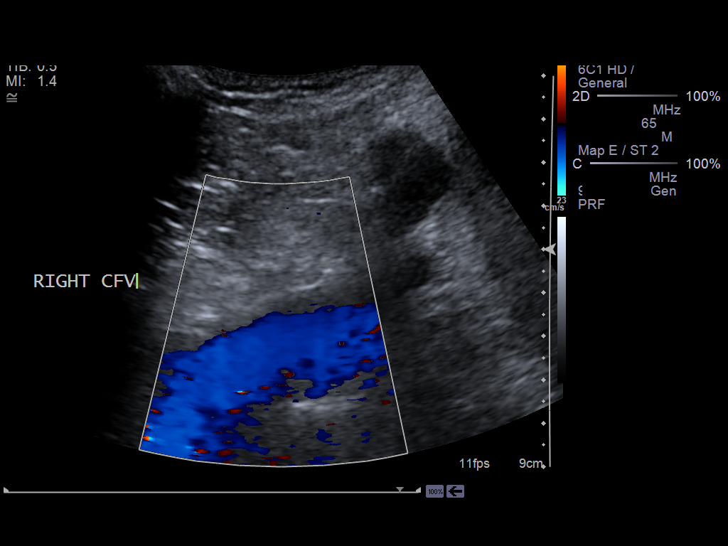
[im 5/52]
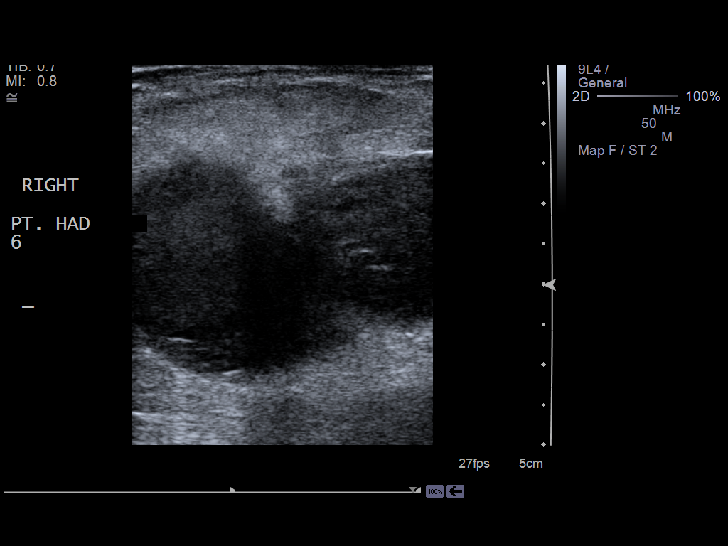
[im 9/52]
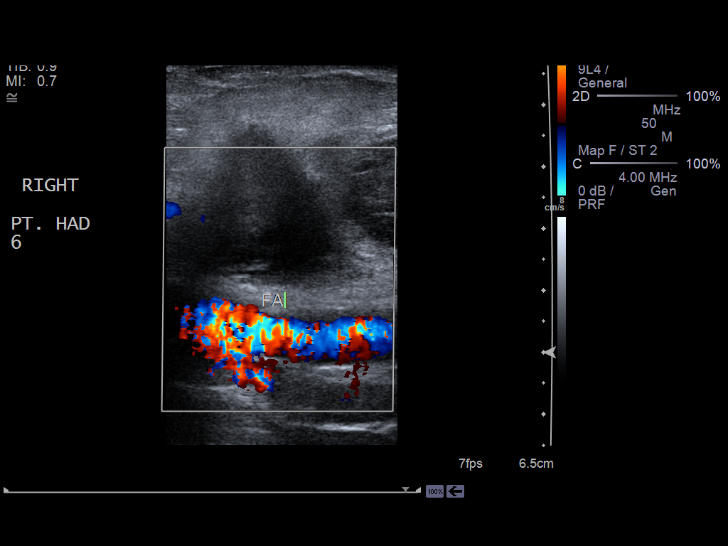
[im 14/52]
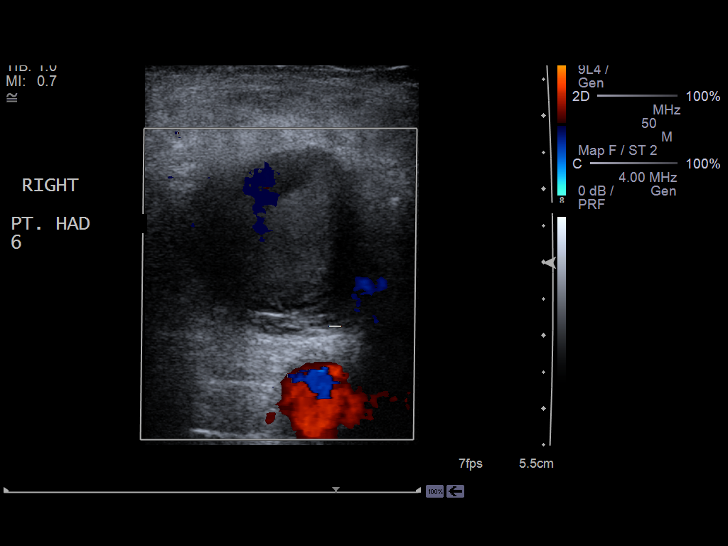
[im 16/52]
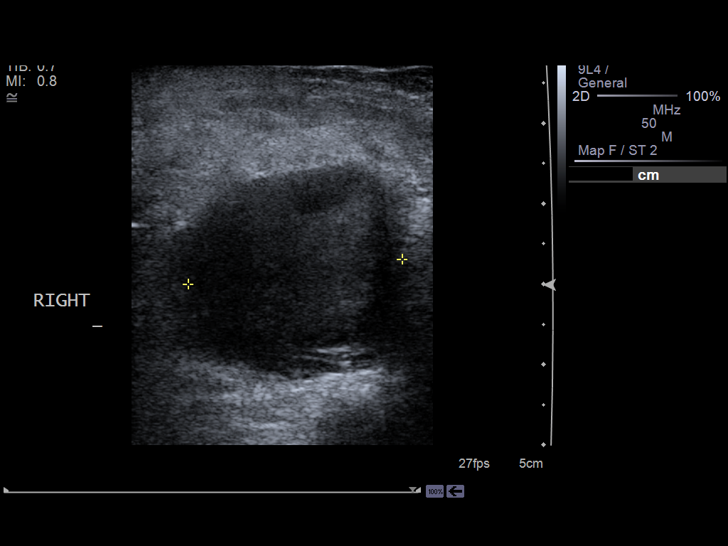
[im 20/52]
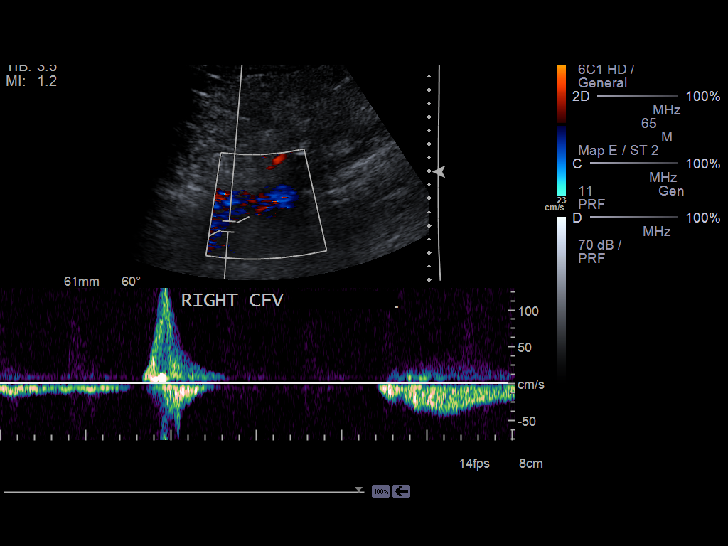
[im 25/52]
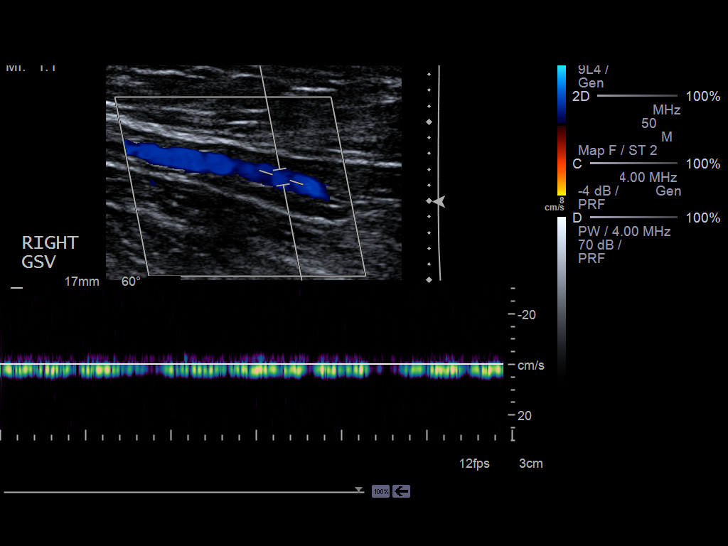
[im 27/52]
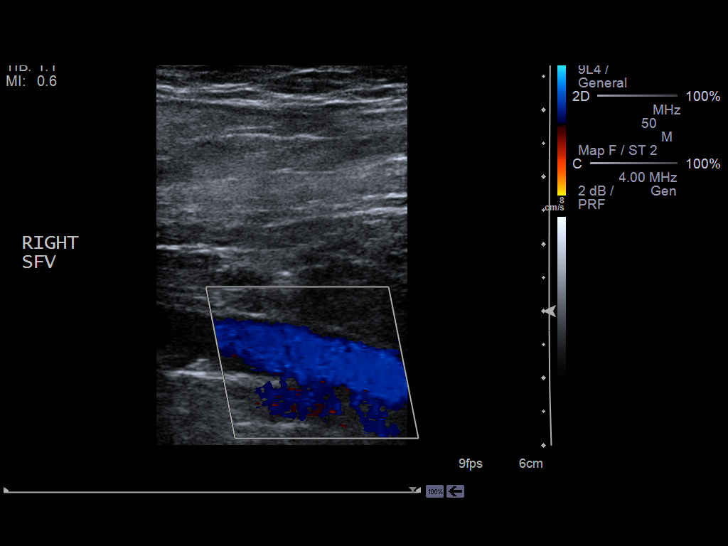
[im 32/52]
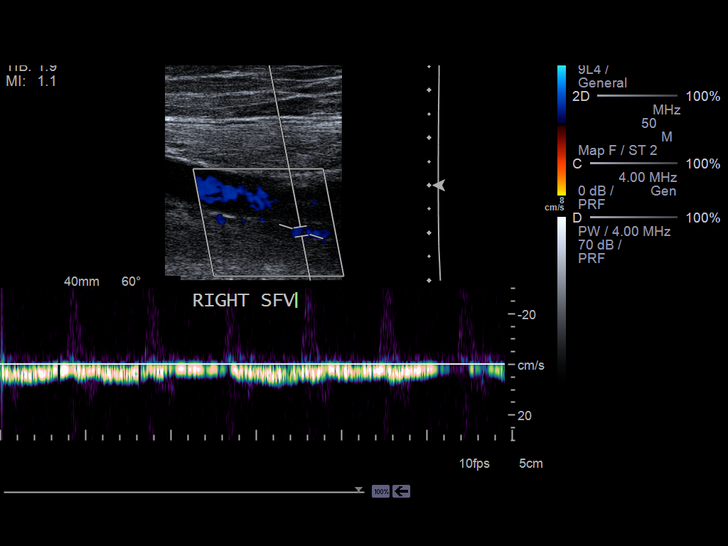
[im 36/52]
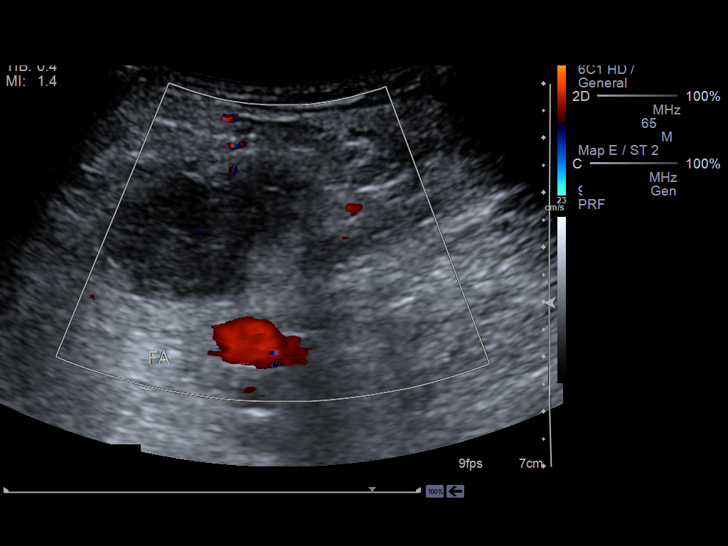
[im 40/52]
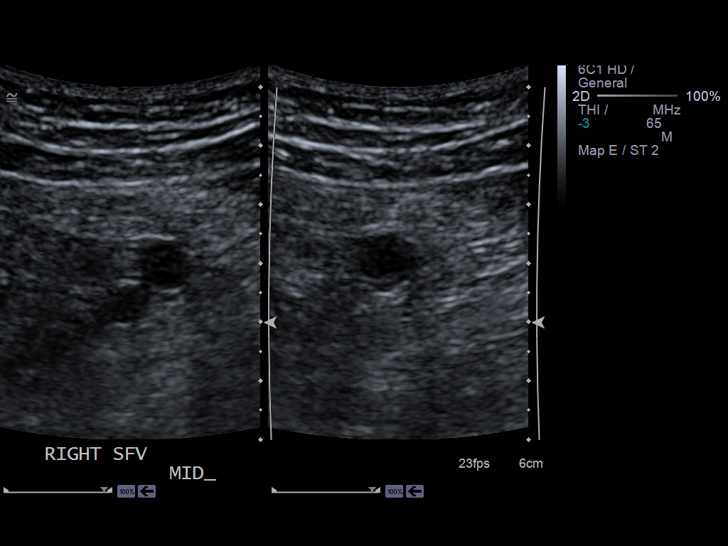
[im 43/52]
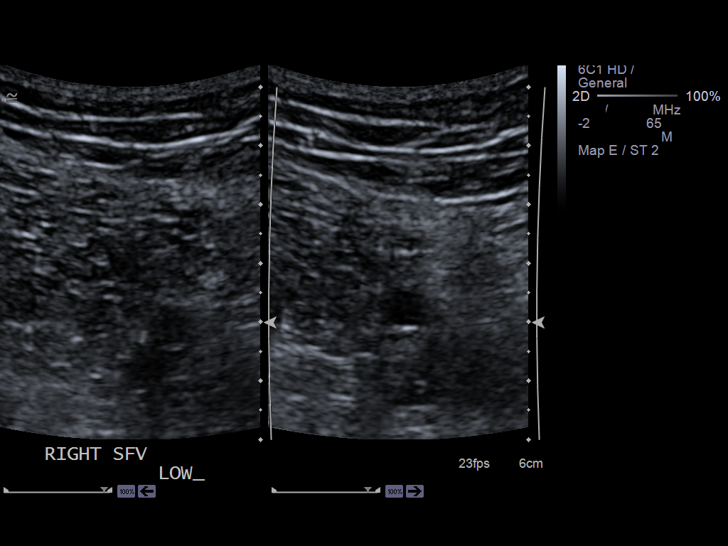
[im 47/52]
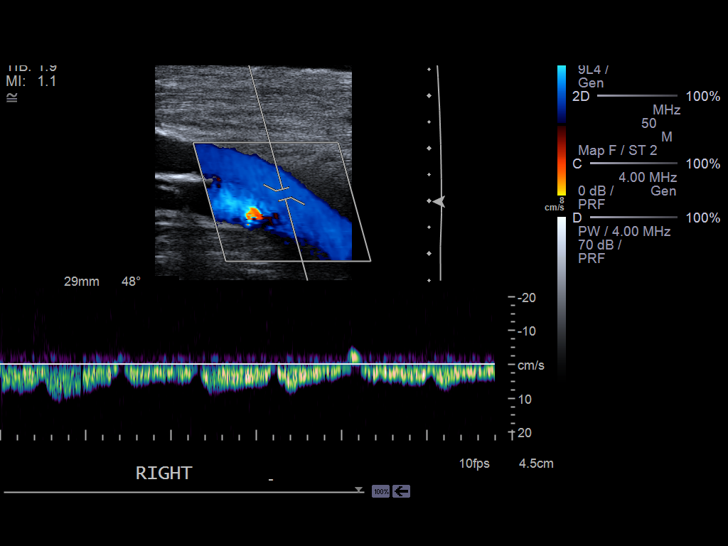
[im 52/52]
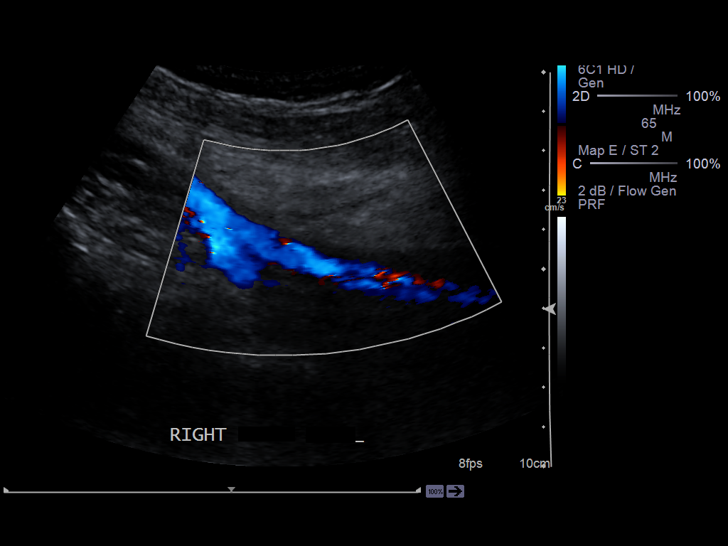

[14 of 24 positions shown; findings below may reference images not displayed]

PROCEDURE:     US  - US DOPPLER LOW EXTR RIGHT  - May 07, 2013  [DATE]

RESULT:     Doppler interrogation of the deep venous system of the right leg
is performed from the common femoral vein through the popliteal vein. The
grayscale images show complete compressibility of the common femoral,
femoral and popliteal venous structures. The color Doppler and spectral
Doppler appearance is normal including appropriate venous phasicity and
augmentation. There is an abnormal hypoattenuating area in the right groin
in this post catheterization patient. This does not connect with the
vascular system. This area measures 3.3 x 2.69 x 2.68 cm and likely
represents a hematoma.
IMPRESSION: 1. Right groin hematoma.
2. No evidence of right lower extremity deep vein thrombosis.

[REDACTED]

## 2013-11-09 ENCOUNTER — Encounter: Payer: Self-pay | Admitting: Internal Medicine

## 2013-11-10 ENCOUNTER — Ambulatory Visit (INDEPENDENT_AMBULATORY_CARE_PROVIDER_SITE_OTHER): Payer: Medicare Other

## 2013-11-10 DIAGNOSIS — Z5181 Encounter for therapeutic drug level monitoring: Secondary | ICD-10-CM

## 2013-11-10 DIAGNOSIS — Z7901 Long term (current) use of anticoagulants: Secondary | ICD-10-CM

## 2013-11-10 DIAGNOSIS — I2699 Other pulmonary embolism without acute cor pulmonale: Secondary | ICD-10-CM

## 2013-11-10 DIAGNOSIS — D66 Hereditary factor VIII deficiency: Secondary | ICD-10-CM

## 2013-11-10 LAB — POCT INR: INR: 2.4

## 2013-12-01 ENCOUNTER — Ambulatory Visit (INDEPENDENT_AMBULATORY_CARE_PROVIDER_SITE_OTHER): Payer: Medicare Other

## 2013-12-01 DIAGNOSIS — Z5181 Encounter for therapeutic drug level monitoring: Secondary | ICD-10-CM

## 2013-12-01 DIAGNOSIS — I2699 Other pulmonary embolism without acute cor pulmonale: Secondary | ICD-10-CM

## 2013-12-01 DIAGNOSIS — Z7901 Long term (current) use of anticoagulants: Secondary | ICD-10-CM

## 2013-12-01 DIAGNOSIS — D66 Hereditary factor VIII deficiency: Secondary | ICD-10-CM

## 2013-12-01 LAB — POCT INR: INR: 2.4

## 2013-12-06 ENCOUNTER — Ambulatory Visit: Payer: Medicare Other | Admitting: Cardiovascular Disease

## 2013-12-07 ENCOUNTER — Encounter: Payer: Self-pay | Admitting: Cardiovascular Disease

## 2013-12-07 ENCOUNTER — Ambulatory Visit (INDEPENDENT_AMBULATORY_CARE_PROVIDER_SITE_OTHER): Payer: Medicare Other | Admitting: Cardiovascular Disease

## 2013-12-07 VITALS — BP 122/68 | HR 75 | Ht 70.0 in | Wt 213.0 lb

## 2013-12-07 DIAGNOSIS — R0602 Shortness of breath: Secondary | ICD-10-CM

## 2013-12-07 DIAGNOSIS — I2510 Atherosclerotic heart disease of native coronary artery without angina pectoris: Secondary | ICD-10-CM

## 2013-12-07 DIAGNOSIS — I4891 Unspecified atrial fibrillation: Secondary | ICD-10-CM

## 2013-12-07 DIAGNOSIS — I10 Essential (primary) hypertension: Secondary | ICD-10-CM

## 2013-12-07 DIAGNOSIS — R413 Other amnesia: Secondary | ICD-10-CM

## 2013-12-07 DIAGNOSIS — E785 Hyperlipidemia, unspecified: Secondary | ICD-10-CM

## 2013-12-07 NOTE — Assessment & Plan Note (Signed)
Blood pressure is well controlled on today's visit. No changes made to the medications. 

## 2013-12-07 NOTE — Assessment & Plan Note (Signed)
Wife reports mild dementia. Encouraged daily walking, puzzles and reading

## 2013-12-07 NOTE — Assessment & Plan Note (Signed)
I suspect his shortness of breath is secondary to obesity, deconditioning. Recommended daily exercise. Wife reports he is sedentary with no motivation

## 2013-12-07 NOTE — Patient Instructions (Addendum)
You are doing well. No medication changes were made.  We need to get the weight down for cholesterol Walking every day Oatmeal daily We can check the cholesterol at the end of the summer  Please call us if you have new issues that need to be addressed before your next appt.  Your physician wants you to follow-up in: 6 months.  You will receive a reminder letter in the mail two months in advance. If you don't receive a letter, please call our office to schedule the follow-up appointment.

## 2013-12-07 NOTE — Progress Notes (Signed)
Patient ID: [name] [name], male    DOB: 06-10-1941, 72 y.o.   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 workup August 2014 for continued episodes of syncope found to have sick sinus syndrome, loop monitor placed with continued episodes of syncope, pacemaker placed at [facility] August 2014. H/o  atrial lead dislodgement and was also found to have reduce R waves. He has undergone PPM lead revisions.    admission to the hospital 10/24/2013 with food poisoning and an ileus, renal failure. He denies chest pain or sob. No syncope or edema.  History of confusion on prior hospital visits . shuffled gait, problems with speech, tremor. He has seen neurology. MRI showed atrophy Carotid ultrasound June 2013 showing mild carotid arterial disease  In followup today, he is back to his baseline. He is very sedentary, does not do any regular exercise. Seems to cough and puff after doing next nothing. This has been a chronic issue Total cholesterol 183, LDL 109, HDL 61 in January 2015  cardiac catheterization 03/23/2013 showing patent grafts x5, LIMA to the LAD, vein graft to the diagonal, vein graft to the RCA, vein graft to the OM1 jump graft to the OM 2, ejection fraction 50%  Previous H/o atrial fibrillation on amiodarone, h/o  neurocardiogenic syncope,  MRI that showed no acute abnormality. some bradycardia.  on amiodarone, weaned off metoprolol Previous episodes of syncope in the past felt to be neurocardiogenic.  episode of syncope  at a restaurant  waiting in line for food. He had nausea, lightheadedness, then had syncope. He is on the ground for several minutes before he came to. Workup in the hospital showed sodium of  122, normal echocardiogram, normal telemetry with normal sinus rhythm. It was felt that he could be orthostatic from the start HCTZ earlier that week with a change in his sodium.    Myoview was performed that showed no ischemia. 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 acetaminophen (TYLENOL) 325 MG tablet Take 650 mg by mouth every 6 (six) hours as needed for pain.  Marland Kitchen adalimumab (HUMIRA PEN) 40 MG/0.8ML injection Inject 40 mg into the skin every 14 (fourteen) days. Tuesday  . ALPRAZolam (XANAX) 0.5 MG tablet Take 0.5 mg by mouth at bedtime as needed for sleep.  Marland Kitchen amiodarone (PACERONE) 200 MG tablet Take 100-200 mg by mouth daily. 200 mg daily alternating with 100 mg  . amLODipine (NORVASC) 5 MG tablet Take 1 tablet (5 mg total) by mouth daily.  Marland Kitchen aspirin 81 MG tablet Take 81 mg by mouth daily.    . carvedilol (COREG) 3.125 MG tablet TAKE 1 TABLET (3.125 MG TOTAL) BY MOUTH 2 (TWO) TIMES DAILY.  Marland Kitchen dimenhyDRINATE (DRAMAMINE) 50 MG tablet Take 50 mg by mouth every 8 (eight) hours as needed (for dizziness).   . diphenhydrAMINE (BENADRYL) 25 mg capsule Take 25 mg by mouth See admin instructions. Take  25 mg 2 hours prior to Humira  . fluticasone (FLONASE) 50 MCG/ACT nasal spray Place 2 sprays into the nose daily as needed for allergies.   . folic acid (FOLVITE) 1 MG tablet Take 1 tablet by mouth daily.   Marland Kitchen losartan (COZAAR) 100 MG tablet TAKE 1 TABLET BY MOUTH EVERY DAY  . Multiple Vitamin (MULTIVITAMIN) tablet Take 1 tablet by mouth daily.    . nitroGLYCERIN (NITROSTAT) 0.4 MG SL tablet Take 1 tablet (0.4 mg total) by mouth as  directed.  Marland Kitchen omeprazole (PRILOSEC) 40 MG capsule Take 40 mg by mouth daily.  . polyethylene glycol powder (GLYCOLAX/MIRALAX) powder daily.  . ranitidine (ZANTAC) 150 MG tablet Take 150 mg by mouth See admin instructions. Take 2 hrs before Humira  . simvastatin (ZOCOR) 40 MG tablet Take 1 tablet by mouth at bedtime.   Marland Kitchen warfarin (COUMADIN) 5 MG tablet Take 5 mg by mouth daily. 5 mg wednesdays and 2.5 mg all other days  . predniSONE (DELTASONE) 20 MG tablet      Review of Systems  Constitutional: Negative.   HENT: Negative.   Eyes: Positive for itching (this).  Respiratory: Positive for shortness of breath.   Cardiovascular: Negative.   Gastrointestinal: Negative.   Endocrine: Negative.   Skin: Negative.   Allergic/Immunologic: Negative.   Neurological: Negative.   Hematological: Negative.   Psychiatric/Behavioral: Negative.        Decreased memory, delay in his speech.  All other systems reviewed and are negative.   BP 122/68  Pulse 75  Ht 5\' 10"  (1.778 m)  Wt 213 lb (96.616 kg)  BMI 30.56 kg/m2  Physical Exam  Nursing note and vitals reviewed. Constitutional: He is oriented to person, place, and time. He appears well-developed and well-nourished.  HENT:  Head: Normocephalic.  Nose: Nose normal.  Mouth/Throat: Oropharynx is clear and moist.  Eyes: Conjunctivae are normal. Pupils are equal, round, and reactive to light.  Neck: Normal range of motion. Neck supple. No JVD present.  Cardiovascular: Normal rate, regular rhythm, S1 normal, S2 normal, normal heart sounds and intact distal pulses.  Exam reveals no gallop and no friction rub.   No murmur heard. Pulmonary/Chest: Effort normal and breath sounds normal. No respiratory distress. He has no wheezes. He has no rales. He exhibits no tenderness.  Abdominal: Soft. Bowel sounds are normal. He exhibits no distension. There is no tenderness.  Musculoskeletal: Normal range of motion. He exhibits no edema and no tenderness.   Lymphadenopathy:    He has no cervical adenopathy.  Neurological: He is alert and oriented to person, place, and time. Coordination normal.  Skin: Skin is warm and dry. No rash noted. No erythema.  Psychiatric: Judgment and thought content normal. His affect is blunt. He is slowed.  Flat affect    Assessment and Plan

## 2013-12-07 NOTE — Assessment & Plan Note (Signed)
Cholesterol above goal. We discussed this with him and his wife. They prefer to start exercise program, watch his diet, try to lose weight. Recheck the cholesterol at the end of the summer. Goal LDL less than 70

## 2013-12-07 NOTE — Assessment & Plan Note (Signed)
Currently with no symptoms of angina. No further workup at this time. Continue current medication regimen. 

## 2013-12-07 NOTE — Assessment & Plan Note (Signed)
Maintaining normal sinus rhythm. No medication changes made 

## 2013-12-23 ENCOUNTER — Other Ambulatory Visit: Payer: Self-pay | Admitting: Internal Medicine

## 2013-12-24 ENCOUNTER — Other Ambulatory Visit: Payer: Self-pay | Admitting: Internal Medicine

## 2013-12-27 ENCOUNTER — Other Ambulatory Visit: Payer: Self-pay | Admitting: *Deleted

## 2013-12-27 MED ORDER — CARVEDILOL 3.125 MG PO TABS: ORAL_TABLET | ORAL | Status: DC

## 2013-12-27 NOTE — Telephone Encounter (Signed)
Requested Prescriptions   Signed Prescriptions Disp Refills  . carvedilol (COREG) 3.125 MG tablet 60 tablet 2    Sig: TAKE 1 TABLET (3.125 MG TOTAL) BY MOUTH 2 (TWO) TIMES DAILY.    Authorizing Provider: Minna Merritts    Ordering User: Britt Bottom

## 2013-12-29 ENCOUNTER — Ambulatory Visit (INDEPENDENT_AMBULATORY_CARE_PROVIDER_SITE_OTHER): Payer: Medicare Other

## 2013-12-29 DIAGNOSIS — Z7901 Long term (current) use of anticoagulants: Secondary | ICD-10-CM

## 2013-12-29 DIAGNOSIS — I2699 Other pulmonary embolism without acute cor pulmonale: Secondary | ICD-10-CM

## 2013-12-29 DIAGNOSIS — D66 Hereditary factor VIII deficiency: Secondary | ICD-10-CM

## 2013-12-29 DIAGNOSIS — Z5181 Encounter for therapeutic drug level monitoring: Secondary | ICD-10-CM

## 2013-12-29 LAB — POCT INR: INR: 1.8

## 2014-01-12 ENCOUNTER — Ambulatory Visit (INDEPENDENT_AMBULATORY_CARE_PROVIDER_SITE_OTHER): Payer: Medicare Other

## 2014-01-12 DIAGNOSIS — I2699 Other pulmonary embolism without acute cor pulmonale: Secondary | ICD-10-CM

## 2014-01-12 DIAGNOSIS — Z7901 Long term (current) use of anticoagulants: Secondary | ICD-10-CM

## 2014-01-12 DIAGNOSIS — Z5181 Encounter for therapeutic drug level monitoring: Secondary | ICD-10-CM

## 2014-01-12 DIAGNOSIS — D66 Hereditary factor VIII deficiency: Secondary | ICD-10-CM

## 2014-01-12 LAB — POCT INR: INR: 1.7

## 2014-01-26 ENCOUNTER — Ambulatory Visit (INDEPENDENT_AMBULATORY_CARE_PROVIDER_SITE_OTHER): Payer: Medicare Other

## 2014-01-26 DIAGNOSIS — I2699 Other pulmonary embolism without acute cor pulmonale: Secondary | ICD-10-CM

## 2014-01-26 DIAGNOSIS — Z5181 Encounter for therapeutic drug level monitoring: Secondary | ICD-10-CM

## 2014-01-26 DIAGNOSIS — D66 Hereditary factor VIII deficiency: Secondary | ICD-10-CM

## 2014-01-26 DIAGNOSIS — Z7901 Long term (current) use of anticoagulants: Secondary | ICD-10-CM

## 2014-01-26 LAB — POCT INR: INR: 2.3

## 2014-01-29 ENCOUNTER — Other Ambulatory Visit: Payer: Self-pay | Admitting: Cardiovascular Disease

## 2014-02-07 ENCOUNTER — Ambulatory Visit (INDEPENDENT_AMBULATORY_CARE_PROVIDER_SITE_OTHER): Payer: Medicare Other | Admitting: *Deleted

## 2014-02-07 DIAGNOSIS — I498 Other specified cardiac arrhythmias: Secondary | ICD-10-CM

## 2014-02-07 DIAGNOSIS — I480 Paroxysmal atrial fibrillation: Secondary | ICD-10-CM

## 2014-02-07 DIAGNOSIS — R001 Bradycardia, unspecified: Secondary | ICD-10-CM

## 2014-02-07 DIAGNOSIS — I4891 Unspecified atrial fibrillation: Secondary | ICD-10-CM

## 2014-02-07 LAB — MDC_IDC_ENUM_SESS_TYPE_REMOTE
Brady Statistic RA Percent Paced: 88 %
Implantable Pulse Generator Serial Number: 68066723
Lead Channel Impedance Value: 488 Ohm
Lead Channel Impedance Value: 507 Ohm
Lead Channel Pacing Threshold Amplitude: 0.7 V
Lead Channel Pacing Threshold Pulse Width: 0.4 ms
Lead Channel Sensing Intrinsic Amplitude: 3.7 mV
Lead Channel Setting Pacing Amplitude: 1.2 V
Lead Channel Setting Pacing Amplitude: 1.8 V
MDC IDC MSMT LEADCHNL RA PACING THRESHOLD PULSEWIDTH: 0.4 ms
MDC IDC MSMT LEADCHNL RV PACING THRESHOLD AMPLITUDE: 0.9 V
MDC IDC MSMT LEADCHNL RV SENSING INTR AMPL: 10.7 mV
MDC IDC PG MODEL: 359529
MDC IDC SET LEADCHNL RV PACING PULSEWIDTH: 0.4 ms
MDC IDC STAT BRADY RV PERCENT PACED: 1 %

## 2014-02-07 NOTE — Progress Notes (Signed)
Remote pacemaker transmission.   

## 2014-02-12 ENCOUNTER — Other Ambulatory Visit: Payer: Self-pay | Admitting: Cardiovascular Disease

## 2014-02-14 ENCOUNTER — Telehealth: Payer: Self-pay

## 2014-02-14 NOTE — Telephone Encounter (Signed)
[facility] calling question regarding Amlodipine. Please call.

## 2014-02-14 NOTE — Telephone Encounter (Signed)
Spoke w/ Linna Hoff.  He wanted to make sure that Dr. [name] is aware of interaction of amlodipine and simvastatin.  Advised him that Dr. [name] did not make any changes to pt's meds at last ov (when pt last has this med filled) and that Dr. [name] would have to override warnings in order to fill this med.  He will call back w/ further questions or concerns.

## 2014-02-16 ENCOUNTER — Ambulatory Visit (INDEPENDENT_AMBULATORY_CARE_PROVIDER_SITE_OTHER): Payer: Medicare Other

## 2014-02-16 ENCOUNTER — Encounter: Payer: Self-pay | Admitting: Cardiology

## 2014-02-16 DIAGNOSIS — D66 Hereditary factor VIII deficiency: Secondary | ICD-10-CM

## 2014-02-16 DIAGNOSIS — Z5181 Encounter for therapeutic drug level monitoring: Secondary | ICD-10-CM

## 2014-02-16 DIAGNOSIS — Z7901 Long term (current) use of anticoagulants: Secondary | ICD-10-CM

## 2014-02-16 DIAGNOSIS — I2699 Other pulmonary embolism without acute cor pulmonale: Secondary | ICD-10-CM

## 2014-02-16 LAB — POCT INR: INR: 2.6

## 2014-02-21 ENCOUNTER — Ambulatory Visit: Payer: Self-pay | Admitting: Family Medicine

## 2014-02-22 ENCOUNTER — Encounter: Payer: Self-pay | Admitting: Internal Medicine

## 2014-02-23 ENCOUNTER — Telehealth: Payer: Self-pay

## 2014-02-23 ENCOUNTER — Ambulatory Visit (INDEPENDENT_AMBULATORY_CARE_PROVIDER_SITE_OTHER): Payer: Medicare Other

## 2014-02-23 DIAGNOSIS — I2699 Other pulmonary embolism without acute cor pulmonale: Secondary | ICD-10-CM

## 2014-02-23 DIAGNOSIS — D66 Hereditary factor VIII deficiency: Secondary | ICD-10-CM

## 2014-02-23 DIAGNOSIS — Z5181 Encounter for therapeutic drug level monitoring: Secondary | ICD-10-CM

## 2014-02-23 DIAGNOSIS — Z7901 Long term (current) use of anticoagulants: Secondary | ICD-10-CM

## 2014-02-23 LAB — POCT INR: INR: 4

## 2014-02-23 NOTE — Telephone Encounter (Signed)
Pt's wife calling to let Doroteo Bradford know that pt was prescribed Septra DS (sulfamethaby Dr. Rosanna Randy on 02/18/14.

## 2014-02-23 NOTE — Telephone Encounter (Signed)
Pt's wife calling to let Doroteo Bradford know that pt was prescribed Septra DS BID by Dr. Rosanna Randy on 02/18/14. States she was advised to call w/ this info in case coumadin dosage needs to be adjusted.

## 2014-02-23 NOTE — Telephone Encounter (Signed)
Error

## 2014-02-23 NOTE — Telephone Encounter (Signed)
Returned call to pt's wife advised Septra DS can interact with Coumadin, advised pt needs to come in today for INR check since he has been on abx x 5 days.  Made appt for today in clinic at 3:30pm.

## 2014-02-28 ENCOUNTER — Telehealth: Payer: Self-pay

## 2014-02-28 NOTE — Telephone Encounter (Signed)
Pt wife called, states he is still on Septra, will finish it up on this Thursday, July 23rd. Please call.

## 2014-02-28 NOTE — Telephone Encounter (Signed)
Telephoned pt back and spoke with spouse, since he is still on Septra appt made for Wednesday to have INR checked.

## 2014-03-02 ENCOUNTER — Ambulatory Visit (INDEPENDENT_AMBULATORY_CARE_PROVIDER_SITE_OTHER): Payer: Medicare Other

## 2014-03-02 DIAGNOSIS — Z5181 Encounter for therapeutic drug level monitoring: Secondary | ICD-10-CM

## 2014-03-02 DIAGNOSIS — Z7901 Long term (current) use of anticoagulants: Secondary | ICD-10-CM

## 2014-03-02 DIAGNOSIS — D66 Hereditary factor VIII deficiency: Secondary | ICD-10-CM

## 2014-03-02 DIAGNOSIS — I2699 Other pulmonary embolism without acute cor pulmonale: Secondary | ICD-10-CM

## 2014-03-02 LAB — POCT INR: INR: 2.2

## 2014-03-08 ENCOUNTER — Other Ambulatory Visit: Payer: Self-pay | Admitting: Cardiology

## 2014-03-23 ENCOUNTER — Ambulatory Visit (INDEPENDENT_AMBULATORY_CARE_PROVIDER_SITE_OTHER): Payer: Medicare Other | Admitting: *Deleted

## 2014-03-23 DIAGNOSIS — Z7901 Long term (current) use of anticoagulants: Secondary | ICD-10-CM

## 2014-03-23 DIAGNOSIS — I2699 Other pulmonary embolism without acute cor pulmonale: Secondary | ICD-10-CM

## 2014-03-23 DIAGNOSIS — Z5181 Encounter for therapeutic drug level monitoring: Secondary | ICD-10-CM

## 2014-03-23 DIAGNOSIS — D66 Hereditary factor VIII deficiency: Secondary | ICD-10-CM

## 2014-03-23 LAB — POCT INR: INR: 2.2

## 2014-03-26 ENCOUNTER — Other Ambulatory Visit: Payer: Self-pay | Admitting: Cardiovascular Disease

## 2014-04-20 ENCOUNTER — Ambulatory Visit (INDEPENDENT_AMBULATORY_CARE_PROVIDER_SITE_OTHER): Payer: Medicare Other

## 2014-04-20 DIAGNOSIS — Z5181 Encounter for therapeutic drug level monitoring: Secondary | ICD-10-CM

## 2014-04-20 DIAGNOSIS — Z7901 Long term (current) use of anticoagulants: Secondary | ICD-10-CM

## 2014-04-20 DIAGNOSIS — I2699 Other pulmonary embolism without acute cor pulmonale: Secondary | ICD-10-CM

## 2014-04-20 DIAGNOSIS — D66 Hereditary factor VIII deficiency: Secondary | ICD-10-CM

## 2014-04-20 LAB — POCT INR: INR: 2.6

## 2014-04-26 DIAGNOSIS — I2699 Other pulmonary embolism without acute cor pulmonale: Secondary | ICD-10-CM | POA: Insufficient documentation

## 2014-04-26 DIAGNOSIS — I779 Disorder of arteries and arterioles, unspecified: Secondary | ICD-10-CM | POA: Insufficient documentation

## 2014-04-26 DIAGNOSIS — I059 Rheumatic mitral valve disease, unspecified: Secondary | ICD-10-CM | POA: Insufficient documentation

## 2014-04-26 DIAGNOSIS — Z951 Presence of aortocoronary bypass graft: Secondary | ICD-10-CM | POA: Insufficient documentation

## 2014-04-26 DIAGNOSIS — I10 Essential (primary) hypertension: Secondary | ICD-10-CM | POA: Insufficient documentation

## 2014-04-26 DIAGNOSIS — I2510 Atherosclerotic heart disease of native coronary artery without angina pectoris: Secondary | ICD-10-CM | POA: Insufficient documentation

## 2014-05-04 ENCOUNTER — Telehealth: Payer: Self-pay

## 2014-05-04 NOTE — Telephone Encounter (Signed)
Pt wife called, states dermatologist needs pt last INR

## 2014-05-09 ENCOUNTER — Ambulatory Visit (INDEPENDENT_AMBULATORY_CARE_PROVIDER_SITE_OTHER): Payer: Medicare Other | Admitting: *Deleted

## 2014-05-09 ENCOUNTER — Encounter: Payer: Self-pay | Admitting: Internal Medicine

## 2014-05-09 DIAGNOSIS — I498 Other specified cardiac arrhythmias: Secondary | ICD-10-CM

## 2014-05-09 DIAGNOSIS — I4891 Unspecified atrial fibrillation: Secondary | ICD-10-CM

## 2014-05-09 DIAGNOSIS — R001 Bradycardia, unspecified: Secondary | ICD-10-CM

## 2014-05-09 DIAGNOSIS — I480 Paroxysmal atrial fibrillation: Secondary | ICD-10-CM

## 2014-05-09 NOTE — Progress Notes (Signed)
Remote pacemaker transmission.   

## 2014-05-10 LAB — MDC_IDC_ENUM_SESS_TYPE_REMOTE
Brady Statistic RV Percent Paced: 1 %
Lead Channel Impedance Value: 488 Ohm
Lead Channel Pacing Threshold Amplitude: 1 V
Lead Channel Pacing Threshold Amplitude: 1.1 V
Lead Channel Pacing Threshold Pulse Width: 0.4 ms
Lead Channel Pacing Threshold Pulse Width: 0.4 ms
Lead Channel Sensing Intrinsic Amplitude: 9.4 mV
MDC IDC MSMT LEADCHNL RA IMPEDANCE VALUE: 488 Ohm
MDC IDC MSMT LEADCHNL RA SENSING INTR AMPL: 2.7 mV
MDC IDC PG MODEL: 359529
MDC IDC PG SERIAL: 68066723
MDC IDC STAT BRADY RA PERCENT PACED: 88 %

## 2014-05-18 ENCOUNTER — Ambulatory Visit (INDEPENDENT_AMBULATORY_CARE_PROVIDER_SITE_OTHER): Payer: Medicare Other

## 2014-05-18 DIAGNOSIS — D66 Hereditary factor VIII deficiency: Secondary | ICD-10-CM

## 2014-05-18 DIAGNOSIS — Z7901 Long term (current) use of anticoagulants: Secondary | ICD-10-CM

## 2014-05-18 DIAGNOSIS — Z5181 Encounter for therapeutic drug level monitoring: Secondary | ICD-10-CM

## 2014-05-18 DIAGNOSIS — I2699 Other pulmonary embolism without acute cor pulmonale: Secondary | ICD-10-CM

## 2014-05-18 LAB — POCT INR: INR: 2.4

## 2014-05-27 ENCOUNTER — Encounter: Payer: Self-pay | Admitting: Cardiology

## 2014-06-06 ENCOUNTER — Encounter: Payer: Self-pay | Admitting: Cardiovascular Disease

## 2014-06-06 ENCOUNTER — Ambulatory Visit (INDEPENDENT_AMBULATORY_CARE_PROVIDER_SITE_OTHER): Payer: Medicare Other | Admitting: Cardiovascular Disease

## 2014-06-06 VITALS — BP 120/70 | HR 67 | Ht 70.0 in | Wt 220.0 lb

## 2014-06-06 DIAGNOSIS — I779 Disorder of arteries and arterioles, unspecified: Secondary | ICD-10-CM

## 2014-06-06 DIAGNOSIS — I739 Peripheral vascular disease, unspecified: Secondary | ICD-10-CM

## 2014-06-06 DIAGNOSIS — R55 Syncope and collapse: Secondary | ICD-10-CM

## 2014-06-06 DIAGNOSIS — K5090 Crohn's disease, unspecified, without complications: Secondary | ICD-10-CM

## 2014-06-06 DIAGNOSIS — I10 Essential (primary) hypertension: Secondary | ICD-10-CM

## 2014-06-06 DIAGNOSIS — E785 Hyperlipidemia, unspecified: Secondary | ICD-10-CM

## 2014-06-06 DIAGNOSIS — I4891 Unspecified atrial fibrillation: Secondary | ICD-10-CM

## 2014-06-06 DIAGNOSIS — Z951 Presence of aortocoronary bypass graft: Secondary | ICD-10-CM

## 2014-06-06 MED ORDER — EZETIMIBE 10 MG PO TABS: 10.0000 mg | ORAL_TABLET | Freq: Every day | ORAL | Status: DC

## 2014-06-06 NOTE — Assessment & Plan Note (Signed)
Stable on his current medications.

## 2014-06-06 NOTE — Assessment & Plan Note (Signed)
We have recommended that he had zetia 10 mg daily in addition to the simvastatin 40 mg daily Various other treatment options discussed with him. Goal total cholesterol less than 150

## 2014-06-06 NOTE — Progress Notes (Signed)
Patient ID: [name] [name], male    DOB: [birth year], 73 y.o.   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 workup August 2014 for continued episodes of syncope found to have sick sinus syndrome, loop monitor placed with continued episodes of syncope, pacemaker placed at [facility] August 2014. H/o  atrial lead dislodgement and was also found to have reduce R waves. He has undergone PPM lead revisions.    admission to the hospital 10/24/2013 with food poisoning and an ileus, renal failure. He denies chest pain or sob. No syncope or edema.  History of confusion on prior hospital visits . shuffled gait, problems with speech, tremor. He has seen neurology. MRI showed atrophy Carotid ultrasound June 2013 showing mild carotid arterial disease  In followup today, he reports that he is doing well. Weight continues to be a major issue.  He does take prednisone periodically prior to his Humira. 4 Crohn's disease He is very sedentary, does not do any regular exercise. Seems to cough and puff after doing minimal exertion. This has been a chronic issue  Total cholesterol 183, LDL 109, HDL 61 in January 2015  cardiac catheterization 03/23/2013 showing patent grafts x5, LIMA to the LAD, vein graft to the diagonal, vein graft to the RCA, vein graft to the OM1 jump graft to the OM 2, ejection fraction 50%  Previous H/o atrial fibrillation on amiodarone, h/o  neurocardiogenic syncope,  MRI that showed no acute abnormality. some bradycardia.  on amiodarone, weaned off metoprolol Previous episodes of syncope in the past felt to be neurocardiogenic.  episode of syncope  at a restaurant  waiting in line for food. He had nausea,  lightheadedness, then had syncope. He is on the ground for several minutes before he came to. Workup in the hospital showed sodium of 122, normal echocardiogram, normal telemetry with normal sinus rhythm. It was felt that he could be orthostatic from the start HCTZ earlier that week with a change in his sodium.    Myoview was performed that showed no ischemia. 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 acetaminophen (TYLENOL) 325 MG tablet Take 650 mg by mouth every 6 (six) hours as needed for pain.  Marland Kitchen adalimumab (HUMIRA PEN) 40 MG/0.8ML injection Inject 40 mg into the skin every 14 (fourteen) days. Tuesday  . ALPRAZolam (XANAX) 0.5 MG tablet Take 0.5 mg by mouth at bedtime as needed for sleep.  Marland Kitchen amiodarone (PACERONE) 200 MG tablet Take 100-200 mg by mouth daily. 200 mg daily alternating with 100 mg  . amLODipine (NORVASC) 5 MG tablet TAKE 1 TABLET BY MOUTH DAILY.  Marland Kitchen aspirin 81 MG tablet Take 81 mg by mouth daily.    Marland Kitchen BREO ELLIPTA 100-25 MCG/INH AEPB daily.   . carvedilol (COREG) 3.125 MG tablet TAKE 1 TABLET (3.125 MG TOTAL) BY MOUTH 2 (TWO) TIMES DAILY.  Marland Kitchen dimenhyDRINATE (DRAMAMINE) 50 MG tablet Take 50  mg by mouth every 8 (eight) hours as needed (for dizziness).   . diphenhydrAMINE (BENADRYL) 25 mg capsule Take 25 mg by mouth See admin instructions. Take 25 mg 2 hours prior to Humira  . fluticasone (FLONASE) 50 MCG/ACT nasal spray Place 2 sprays into the nose daily as needed for allergies.   . folic acid (FOLVITE) 1 MG tablet Take 1 tablet by mouth daily.   Marland Kitchen levothyroxine (SYNTHROID, LEVOTHROID) 50 MCG tablet Take 50 mcg  by mouth daily before breakfast.  . losartan (COZAAR) 100 MG tablet TAKE 1 TABLET BY MOUTH EVERY DAY  . Multiple Vitamin (MULTIVITAMIN) tablet Take 1 tablet by mouth daily.    . nitroGLYCERIN (NITROSTAT) 0.4 MG SL tablet Take 1 tablet (0.4 mg total) by mouth as directed.  Marland Kitchen omeprazole (PRILOSEC) 40 MG capsule Take 40 mg by mouth daily.  . polyethylene glycol powder (GLYCOLAX/MIRALAX) powder daily.  . predniSONE (DELTASONE) 20 MG tablet Take as directed.  Marland Kitchen PROAIR HFA 108 (90 BASE) MCG/ACT inhaler Inhale 2 puffs into the lungs as needed.   . ranitidine (ZANTAC) 150 MG tablet Take 150 mg by mouth See admin instructions. Take 2 hrs before Humira  . simvastatin (ZOCOR) 40 MG tablet Take 1 tablet by mouth at bedtime.   Marland Kitchen warfarin (COUMADIN) 5 MG tablet Take 5 mg by mouth daily. 5 mg Wednesdays & Saturdays and 2.5 mg all other days.   Review of Systems  Constitutional: Negative.   HENT: Negative.   Eyes: Negative.  Eye itching: this.  Respiratory: Positive for shortness of breath.   Cardiovascular: Negative.   Gastrointestinal: Negative.   Endocrine: Negative.   Skin: Negative.   Allergic/Immunologic: Negative.   Neurological: Negative.   Hematological: Negative.   Psychiatric/Behavioral: Negative.        Decreased memory, delay in his speech.  All other systems reviewed and are negative.   BP 120/70  Pulse 67  Ht 5\' 10"  (1.778 m)  Wt 220 lb (99.791 kg)  BMI 31.57 kg/m2  Physical Exam  Nursing note and vitals reviewed. Constitutional: He is oriented to person, place, and time. He appears well-developed and well-nourished.  Obese  HENT:  Head: Normocephalic.  Nose: Nose normal.  Mouth/Throat: Oropharynx is clear and moist.  Eyes: Conjunctivae are normal. Pupils are equal, round, and reactive to light.  Neck: Normal range of motion. Neck supple. No JVD present.  Cardiovascular: Normal rate, regular rhythm, S1 normal, S2 normal, normal heart sounds and intact distal pulses.  Exam  reveals no gallop and no friction rub.   No murmur heard. Pulmonary/Chest: Effort normal and breath sounds normal. No respiratory distress. He has no wheezes. He has no rales. He exhibits no tenderness.  Abdominal: Soft. Bowel sounds are normal. He exhibits no distension. There is no tenderness.  Musculoskeletal: Normal range of motion. He exhibits no edema and no tenderness.  Lymphadenopathy:    He has no cervical adenopathy.  Neurological: He is alert and oriented to person, place, and time. Coordination normal.  Skin: Skin is warm and dry. No rash noted. No erythema.  Psychiatric: Judgment and thought content normal. His affect is blunt. He is slowed.  Flat affect    Assessment and Plan

## 2014-06-06 NOTE — Assessment & Plan Note (Signed)
Blood pressure is well controlled on today's visit. No changes made to the medications. 

## 2014-06-06 NOTE — Assessment & Plan Note (Signed)
Currently with no symptoms of angina. No further workup at this time. Continue current medication regimen. 

## 2014-06-06 NOTE — Assessment & Plan Note (Signed)
No recent episodes of syncope or near syncope

## 2014-06-06 NOTE — Assessment & Plan Note (Signed)
Maintaining normal sinus rhythm today. No changes to his medications

## 2014-06-06 NOTE — Patient Instructions (Addendum)
You are doing well.  Cholesterol is high Please start zetia one a day  Please call us if you have new issues that need to be addressed before your next appt.  Your physician wants you to follow-up in: 6 months.  You will receive a reminder letter in the mail two months in advance. If you don't receive a letter, please call our office to schedule the follow-up appointment.  Your next appointment will be scheduled in our new office located at :  [facility]  [address] [place]  [place], [place] [address]

## 2014-06-06 NOTE — Assessment & Plan Note (Signed)
Doing well on humira.  

## 2014-06-27 DIAGNOSIS — R4701 Aphasia: Secondary | ICD-10-CM | POA: Insufficient documentation

## 2014-06-28 ENCOUNTER — Other Ambulatory Visit: Payer: Self-pay | Admitting: Internal Medicine

## 2014-06-29 ENCOUNTER — Ambulatory Visit (INDEPENDENT_AMBULATORY_CARE_PROVIDER_SITE_OTHER): Payer: Medicare Other

## 2014-06-29 ENCOUNTER — Telehealth: Payer: Self-pay | Admitting: Cardiovascular Disease

## 2014-06-29 DIAGNOSIS — I2699 Other pulmonary embolism without acute cor pulmonale: Secondary | ICD-10-CM

## 2014-06-29 DIAGNOSIS — Z5181 Encounter for therapeutic drug level monitoring: Secondary | ICD-10-CM

## 2014-06-29 DIAGNOSIS — Z7901 Long term (current) use of anticoagulants: Secondary | ICD-10-CM

## 2014-06-29 DIAGNOSIS — D66 Hereditary factor VIII deficiency: Secondary | ICD-10-CM

## 2014-06-29 LAB — POCT INR: INR: 2

## 2014-06-29 NOTE — Telephone Encounter (Signed)
Calling to let us know pt was put on a new drug, Robinu he is taking 5 tenths of a pill for a week, and then at the end of the week they take one pill.

## 2014-07-01 NOTE — Telephone Encounter (Signed)
Review this medication for any reason may cause problems with other medications.

## 2014-07-04 ENCOUNTER — Other Ambulatory Visit: Payer: Self-pay

## 2014-07-04 MED ORDER — CARVEDILOL 3.125 MG PO TABS: ORAL_TABLET | ORAL | Status: DC

## 2014-07-04 MED ORDER — EZETIMIBE 10 MG PO TABS: 10.0000 mg | ORAL_TABLET | Freq: Every day | ORAL | Status: DC

## 2014-07-04 MED ORDER — AMIODARONE HCL 200 MG PO TABS: 100.0000 mg | ORAL_TABLET | Freq: Every day | ORAL | Status: DC

## 2014-07-04 MED ORDER — AMLODIPINE BESYLATE 5 MG PO TABS: 5.0000 mg | ORAL_TABLET | Freq: Every day | ORAL | Status: DC

## 2014-07-04 NOTE — Telephone Encounter (Signed)
Refill sent for carvedilol.  

## 2014-07-04 NOTE — Telephone Encounter (Signed)
We will need to look up this medication I have not heard of this medication before

## 2014-07-04 NOTE — Telephone Encounter (Signed)
Refill sent for amiodarone 

## 2014-07-04 NOTE — Telephone Encounter (Signed)
Refill sent for zetia.  

## 2014-07-04 NOTE — Telephone Encounter (Signed)
Refill sent for amlodipine.  

## 2014-07-05 NOTE — Telephone Encounter (Signed)
Left message for pt to call back to clarify name of medication.

## 2014-07-05 NOTE — Telephone Encounter (Signed)
Spoke w/ pt.  He reports that he is taking Robinul.  Generic is glycopyrrolate. He would like to know if this "affects my blood and if Dr. [name] thinks it's ok for me to take with my coumadin." One of the warnings of the drug is to"tell your doctor if you have heart problems (eg, coronary heart disease, heart failure), a fast or irregular heartbeat, high blood pressure, or an overactive thyroid" Please advise.  Thank you.

## 2014-07-12 NOTE — Telephone Encounter (Signed)
I am not seeing any problems with this medication but would check with the Coumadin nurse

## 2014-07-15 NOTE — Telephone Encounter (Signed)
Called spoke with pt no interaction of new medication with Coumadin.

## 2014-07-21 ENCOUNTER — Encounter (HOSPITAL_COMMUNITY): Payer: Self-pay | Admitting: Internal Medicine

## 2014-07-26 ENCOUNTER — Encounter: Payer: Self-pay | Admitting: Internal Medicine

## 2014-07-26 ENCOUNTER — Ambulatory Visit (INDEPENDENT_AMBULATORY_CARE_PROVIDER_SITE_OTHER): Payer: Medicare Other | Admitting: Internal Medicine

## 2014-07-26 VITALS — BP 118/62 | HR 64 | Ht 70.0 in | Wt 220.0 lb

## 2014-07-26 DIAGNOSIS — R55 Syncope and collapse: Secondary | ICD-10-CM

## 2014-07-26 DIAGNOSIS — Z950 Presence of cardiac pacemaker: Secondary | ICD-10-CM

## 2014-07-26 DIAGNOSIS — R001 Bradycardia, unspecified: Secondary | ICD-10-CM

## 2014-07-26 DIAGNOSIS — I10 Essential (primary) hypertension: Secondary | ICD-10-CM

## 2014-07-26 DIAGNOSIS — I480 Paroxysmal atrial fibrillation: Secondary | ICD-10-CM

## 2014-07-26 LAB — MDC_IDC_ENUM_SESS_TYPE_INCLINIC
Brady Statistic RA Percent Paced: 88 %
Brady Statistic RV Percent Paced: 1 %
Implantable Pulse Generator Model: 359529
Implantable Pulse Generator Serial Number: 68066723
Lead Channel Impedance Value: 507 Ohm
Lead Channel Pacing Threshold Amplitude: 0.9 V
Lead Channel Pacing Threshold Amplitude: 0.9 V
Lead Channel Pacing Threshold Pulse Width: 0.4 ms
Lead Channel Sensing Intrinsic Amplitude: 3.8 mV
Lead Channel Setting Pacing Amplitude: 1.4 V
Lead Channel Setting Pacing Amplitude: 1.9 V
Lead Channel Setting Pacing Pulse Width: 0.4 ms
MDC IDC MSMT BATTERY REMAINING LONGEVITY: 107 mo
MDC IDC MSMT LEADCHNL RV IMPEDANCE VALUE: 507 Ohm
MDC IDC MSMT LEADCHNL RV PACING THRESHOLD PULSEWIDTH: 0.4 ms
MDC IDC MSMT LEADCHNL RV SENSING INTR AMPL: 10.7 mV
MDC IDC SESS DTM: 20151215113649

## 2014-07-26 NOTE — Assessment & Plan Note (Signed)
We discussed the mechanisms of autonomic dysfunction and orthostasis. Too prevent another episode, I have asked the patient to get up slower and not to strain while using the bathroom.

## 2014-07-26 NOTE — Patient Instructions (Signed)
Your physician wants you to follow-up in: 12 months with Dr. Taylor You will receive a reminder letter in the mail two months in advance. If you don't receive a letter, please call our office to schedule the follow-up appointment.    Remote monitoring is used to monitor your Pacemaker or ICD from home. This monitoring reduces the number of office visits required to check your device to one time per year. It allows us to keep an eye on the functioning of your device to ensure it is working properly. You are scheduled for a device check from home on 10/25/14. You may send your transmission at any time that day. If you have a wireless device, the transmission will be sent automatically. After your physician reviews your transmission, you will receive a postcard with your next transmission date.   

## 2014-07-26 NOTE — Assessment & Plan Note (Signed)
His Biotronik dual-chamber pacemaker is now working normally. We'll plan to recheck in several months.

## 2014-07-26 NOTE — Progress Notes (Signed)
HPI [name] returns today for followup. He is a pleasant 73 yo man with a h/o symptomatic bradycardia, s/p PPM insertion who developed an atrial lead dislodgement and was also found to have reduce R waves. He has undergone PPM lead revisions. Since his revision over 12 months ago. He has done well except for dietary indiscretion and an episode of syncope when he strained using the bathroom and tried to get up too quick.  He denies chest pain or sob. No edema. Allergies  Allergen Reactions  . Codeine Nausea Only  . Diltiazem Hcl Itching and Rash  . Doxycycline Itching and Rash  . Infliximab Itching and Rash  . Mercaptopurine Itching and Rash  . Mesalamine Itching and Rash  . Methotrexate Itching and Rash  . Metronidazole Itching and Rash  . Pyridostigmine Bromide Nausea Only     Current Outpatient Prescriptions  Medication Sig Dispense Refill  . acetaminophen (TYLENOL) 325 MG tablet Take 650 mg by mouth every 6 (six) hours as needed for pain.    Marland Kitchen adalimumab (HUMIRA PEN) 40 MG/0.8ML injection Inject 40 mg into the skin every 14 (fourteen) days. Tuesday    . ALPRAZolam (XANAX) 0.5 MG tablet Take 0.5 mg by mouth at bedtime as needed for sleep.    Marland Kitchen amiodarone (PACERONE) 200 MG tablet Take 0.5-1 tablets (100-200 mg total) by mouth daily. 200 mg daily alternating with 100 mg 90 tablet 3  . amLODipine (NORVASC) 5 MG tablet Take 1 tablet (5 mg total) by mouth daily. 90 tablet 3  . aspirin 81 MG tablet Take 81 mg by mouth daily.      Marland Kitchen BREO ELLIPTA 100-25 MCG/INH AEPB daily.     . carvedilol (COREG) 3.125 MG tablet TAKE 1 TABLET (3.125 MG TOTAL) BY MOUTH 2 (TWO) TIMES DAILY. 180 tablet 3  . dimenhyDRINATE (DRAMAMINE) 50 MG tablet Take 50 mg by mouth every 8 (eight) hours as needed (for dizziness).     . diphenhydrAMINE (BENADRYL) 25 mg capsule Take 25 mg by mouth See admin instructions. Take 25 mg 2 hours prior to Humira    . ezetimibe (ZETIA) 10 MG tablet Take 1 tablet (10 mg total)  by mouth daily. 90 tablet 3  . fluticasone (FLONASE) 50 MCG/ACT nasal spray Place 2 sprays into the nose daily as needed for allergies.     . folic acid (FOLVITE) 1 MG tablet Take 1 tablet by mouth daily.     Marland Kitchen levothyroxine (SYNTHROID, LEVOTHROID) 50 MCG tablet Take 50 mcg by mouth daily before breakfast.    . losartan (COZAAR) 100 MG tablet TAKE 1 TABLET BY MOUTH EVERY DAY 90 tablet 3  . Multiple Vitamin (MULTIVITAMIN) tablet Take 1 tablet by mouth daily.      . nitroGLYCERIN (NITROSTAT) 0.4 MG SL tablet Take 1 tablet (0.4 mg total) by mouth as directed. 25 tablet 6  . omeprazole (PRILOSEC) 40 MG capsule TAKE 1 CAPSULE BY MOUTH DAILY. 30 capsule 5  . polyethylene glycol powder (GLYCOLAX/MIRALAX) powder daily.    . predniSONE (DELTASONE) 20 MG tablet Take as directed.    Marland Kitchen PROAIR HFA 108 (90 BASE) MCG/ACT inhaler Inhale 2 puffs into the lungs as needed.     . ranitidine (ZANTAC) 150 MG tablet Take 150 mg by mouth See admin instructions. Take 2 hrs before Humira    . warfarin (COUMADIN) 5 MG tablet Take 5 mg by mouth daily. 5 mg Wednesdays & Saturdays and 2.5 mg all other days.  No current facility-administered medications for this visit.     Past Medical History  Diagnosis Date  . Syncope and collapse     Evaluation by Dr Caryl Comes; Syncope probably neutrally mediated and modest resting sinus bradycardia. and 16 B. episode of SVT that was either A fib or another type of SVT. Patient improved with combination of holding ACE inhibitor or low BP and reducing beta blocker for bradycardia  . Hypertension                                                                                                                                                                                                                                                                                                                                                                                                                        . Coronary artery disease     a. CABG 2008. b. Relook cath 08/12/08 - all 5 grafts are patent  . Crohn's disease     Humara Rx in past  . SVT (supraventricular tachycardia)   . 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 History of benign colon tumor   . Mitral regurgitation     mild - echo 4/09  . Anxiety   . Gait difficulty     with Crestor (but tolerates simva)  . Pulmonary embolism     2007,b CT scan, IVC fiter placed then because of concern about coumadin use at that time.  . Orthostasis  treated  . Drug therapy     Intermittant steroids   . Warfarin anticoagulation   . Factor VIII     Factor VIII EXCESS.Marland KitchenMarland KitchenDr Gwenlyn Fudge.Marland Kitchenanother reason for coumadin  . Incomplete RBBB   . Drug therapy     question rash diltiazem, and amio  . Ejection fraction     EF 60-65%, echo, March, 2012, moderate diastolic dysfunction  . 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 Melvyn Novas; has chronic shortness of breath  . PAF (paroxysmal atrial fibrillation)     April, 2013, new diagnosis  . Bradycardia     a. s/p [employer] pacemaker placement 04/02/13. b. Lead removal/new lead insertion 07/2013 due to dysfunction.  . Carotid artery disease     Dopplers to be checked, June, 2013  . Cough     October, 2013  . Tremor     October, 2013  . High cholesterol   . OSA (obstructive sleep apnea)     "quit wearing his mask" (07/30/2013)  . GERD (gastroesophageal reflux disease)     ROS:   All systems reviewed and negative except as noted in the HPI.   Past Surgical History  Procedure Laterality Date  . Cholecystectomy    . Back surgery    . Cataract extraction w/ intraocular lens  implant, bilateral Bilateral 1990's  . Colonoscopy w/ polypectomy    . Loop monitor  03/2013    "removed w/in 2 days" (07/30/2013)  . Lead revision  07/30/2013    DDD PM leads removed and a new set of PPM leads inserted via the left subclavian vein. [id number]  . Tonsillectomy    . Insert / replace / remove pacemaker  03/2013     [facility]  . Cardiac catheterization  03/2013    [facility]  . Cardiac catheterization  2008; ~ 2011  . Coronary artery bypass graft  4/08    CABG X5  . Lumbar disc surgery  1978; 1986  . Colon surgery      "had a tumor removed" (07/30/2013)  . Vena cava filter placement  2007    PE  . Loop recorder implant N/A 03/24/2013    Procedure: LOOP RECORDER IMPLANT;  Surgeon: Deboraha Sprang, MD;  Location: [facility] CATH LAB;  Service: Cardiovascular;  Laterality: N/A;  . Permanent pacemaker insertion N/A 04/02/2013    Procedure: PERMANENT PACEMAKER INSERTION;  Surgeon: Evans Lance, MD;  Location: [facility] CATH LAB;  Service: Cardiovascular;  Laterality: N/A;  . Lead revision N/A 07/30/2013    Procedure: LEAD REVISION;  Surgeon: Evans Lance, MD;  Location: [facility] CATH LAB;  Service: Cardiovascular;  Laterality: N/A;     Family History  Problem Relation Age of Onset  . Family history unknown: Yes     History   Social History  . Marital Status: Married    Spouse Name: N/A    Number of Children: N/A  . Years of Education: N/A   Occupational History  . retired    Social History Main Topics  . Smoking status: Former Smoker -- 0.25 packs/day for 5 years    Types: Cigarettes  . Smokeless tobacco: Never Used     Comment: 07/30/2013 "stopped smoking ~ 1963"  . Alcohol Use: No  . Drug Use: No  . Sexual Activity: Not Currently   Other Topics Concern  . Not on file   Social History Narrative     BP 118/62 mmHg  Pulse 64  Ht 5\' 10"  (1.778 m)  Wt 220  lb (99.791 kg)  BMI 31.57 kg/m2  Physical Exam:  Well appearing 73 yo man, NAD HEENT: Unremarkable Neck:  No JVD, no thyromegally Back:  No CVA tenderness Lungs:  Clear with no wheezes, rales, or rhonchi HEART:  Regular rate rhythm, no murmurs, no rubs, no clicks Abd:  soft, positive bowel sounds, no organomegally, no rebound, no guarding Ext:  2 plus pulses, no edema, no cyanosis, no clubbing Skin:  No rashes no nodules Neuro:  CN II through XII  intact, motor grossly intact   DEVICE  Normal device function.  See PaceArt for details.   Assess/Plan:

## 2014-07-26 NOTE — Assessment & Plan Note (Signed)
Over the past year, he has been out of rhythm for 20 minutes. He will continue his low-dose amiodarone.

## 2014-07-26 NOTE — Assessment & Plan Note (Signed)
His blood pressure is well controlled. Will recheck in several months. No change in meds.

## 2014-08-10 ENCOUNTER — Ambulatory Visit (INDEPENDENT_AMBULATORY_CARE_PROVIDER_SITE_OTHER): Payer: Medicare Other

## 2014-08-10 DIAGNOSIS — Z5181 Encounter for therapeutic drug level monitoring: Secondary | ICD-10-CM

## 2014-08-10 DIAGNOSIS — D66 Hereditary factor VIII deficiency: Secondary | ICD-10-CM

## 2014-08-10 DIAGNOSIS — Z7901 Long term (current) use of anticoagulants: Secondary | ICD-10-CM

## 2014-08-10 DIAGNOSIS — I2699 Other pulmonary embolism without acute cor pulmonale: Secondary | ICD-10-CM

## 2014-08-10 LAB — POCT INR: INR: 2.4

## 2014-09-21 ENCOUNTER — Ambulatory Visit (INDEPENDENT_AMBULATORY_CARE_PROVIDER_SITE_OTHER): Payer: Medicare Other | Admitting: Pharmacist

## 2014-09-21 DIAGNOSIS — Z5181 Encounter for therapeutic drug level monitoring: Secondary | ICD-10-CM

## 2014-09-21 DIAGNOSIS — D66 Hereditary factor VIII deficiency: Secondary | ICD-10-CM

## 2014-09-21 DIAGNOSIS — Z7901 Long term (current) use of anticoagulants: Secondary | ICD-10-CM

## 2014-09-21 DIAGNOSIS — I2699 Other pulmonary embolism without acute cor pulmonale: Secondary | ICD-10-CM

## 2014-09-21 LAB — POCT INR: INR: 2.1

## 2014-09-26 ENCOUNTER — Other Ambulatory Visit: Payer: Self-pay | Admitting: Cardiovascular Disease

## 2014-10-25 ENCOUNTER — Telehealth: Payer: Self-pay | Admitting: Internal Medicine

## 2014-10-25 ENCOUNTER — Ambulatory Visit (INDEPENDENT_AMBULATORY_CARE_PROVIDER_SITE_OTHER): Payer: Medicare Other | Admitting: *Deleted

## 2014-10-25 DIAGNOSIS — I480 Paroxysmal atrial fibrillation: Secondary | ICD-10-CM

## 2014-10-25 NOTE — Telephone Encounter (Signed)
Patient to send transmission later today.

## 2014-10-25 NOTE — Telephone Encounter (Signed)
New Message  Pt wanted to let our office know, he will transmit his remote signal around 3:00 pm today (3/15).

## 2014-10-26 NOTE — Progress Notes (Signed)
Remote pacemaker transmission.   

## 2014-10-27 ENCOUNTER — Other Ambulatory Visit: Payer: Self-pay | Admitting: Internal Medicine

## 2014-10-27 LAB — MDC_IDC_ENUM_SESS_TYPE_REMOTE
Brady Statistic RA Percent Paced: 91 %
Brady Statistic RV Percent Paced: 0 %
Implantable Pulse Generator Serial Number: 68066723
Lead Channel Pacing Threshold Amplitude: 0.8 V
Lead Channel Sensing Intrinsic Amplitude: 3.6 mV
Lead Channel Setting Pacing Amplitude: 1.4 V
MDC IDC MSMT LEADCHNL RV PACING THRESHOLD AMPLITUDE: 1 V
MDC IDC MSMT LEADCHNL RV SENSING INTR AMPL: 10.2 mV
MDC IDC PG MODEL: 359529
MDC IDC SET LEADCHNL RA PACING AMPLITUDE: 1.9 V
MDC IDC SET LEADCHNL RV PACING PULSEWIDTH: 0.4 ms

## 2014-11-02 ENCOUNTER — Ambulatory Visit (INDEPENDENT_AMBULATORY_CARE_PROVIDER_SITE_OTHER): Payer: Medicare Other

## 2014-11-02 DIAGNOSIS — I2699 Other pulmonary embolism without acute cor pulmonale: Secondary | ICD-10-CM

## 2014-11-02 DIAGNOSIS — D66 Hereditary factor VIII deficiency: Secondary | ICD-10-CM

## 2014-11-02 DIAGNOSIS — Z5181 Encounter for therapeutic drug level monitoring: Secondary | ICD-10-CM | POA: Diagnosis not present

## 2014-11-02 DIAGNOSIS — Z7901 Long term (current) use of anticoagulants: Secondary | ICD-10-CM

## 2014-11-02 LAB — POCT INR: INR: 2.5

## 2014-11-03 ENCOUNTER — Encounter: Payer: Self-pay | Admitting: Cardiology

## 2014-11-10 ENCOUNTER — Encounter: Payer: Self-pay | Admitting: Internal Medicine

## 2014-11-14 ENCOUNTER — Other Ambulatory Visit: Payer: Self-pay | Admitting: Cardiology

## 2014-11-17 ENCOUNTER — Encounter: Payer: Self-pay | Admitting: Cardiology

## 2014-12-02 NOTE — Consult Note (Signed)
PATIENT NAME:  [name] [name], [name] [name] MR#:  [id number] DATE OF BIRTH:  07-Jun-1941  DATE OF CONSULTATION:  03/20/2013  REFERRING PHYSICIAN:  Nicholes Mango, MD CONSULTING PHYSICIAN:  Champ Mungo. Lovena Le, MD  INDICATION FOR CONSULTATION: Evaluation of chest pain and syncope.   HISTORY OF PRESENT ILLNESS: The patient is a very pleasant 74 year old man with multiple medical problems including an ischemic cardiomyopathy, status post bypass surgery in 2007. He has a history of sleep apnea but is not on CPAP. He has a history of hypertension and dyslipidemia. The patient was in his usual state of health until early this morning when he got up to go to the bathroom and experienced chest pain. He subsequently became weak and passed out. He did not injure himself. He was out for several minutes according to his wife, who was in the house but not in the room with him when the episode occurred. The patient on awakening denied tongue biting, loss of bowel or bladder continence. He was taken to the Emergency Room for additional evaluation. His chest pain has resolved and his initial cardiac markers are negative. EKG is not acute. There have been no cardiac arrhythmias noted up until now. His history is notable in that in 2007 prior to his bypass surgery, the patient experienced chest pain and initial noninvasive evaluation was all negative until he was subsequently found to have severe 5-vessel coronary artery disease and underwent bypass surgery at that time. The patient appears to have a fairly sedentary lifestyle and is not particularly active. In addition to the chest discomfort, he had diaphoresis and felt cold. Sublingual nitroglycerin resolved his symptoms. The patient does have a history of pulmonary embolism and has been on Coumadin. His INRs have been therapeutic. Initial evaluation demonstrated a negative CT scan and his initial chest x-ray was not acute. He does not have much in the way of heart failure symptoms.   His  additional past medical history is notable, as previously noted, including pulmonary embolism and a DVT. He has a history of Crohn disease. He has tested positive for factor V Leiden mutation. He has a history of obesity and hypertension and sleep apnea, as previously noted, not on CPAP.   Additional surgical history is notable for a cholecystectomy and colon resection and back surgery x 2. He is also status post bilateral cataract removal.   HOME MEDICATIONS: Include amiodarone 200 mg every other day and 100 mg every other day. He continues on Coumadin. He is on losartan 100 mg daily, omeprazole 40 a day, simvastatin 40 a day, Zantac, prednisone and aspirin 81 mg a day.  FAMILY HISTORY: Notable for brain cancer.   REVIEW OF SYSTEMS: Negative except as noted in the HPI.  PHYSICAL EXAMINATION:  GENERAL: He is a pleasant 74 year old man in no acute distress.  VITAL SIGNS: The blood pressure was 117/62. The pulse was 60 and regular. Respirations were 18. Temperature was 98. HEENT: Normocephalic, atraumatic. Pupils equal and round. The oropharynx is moist. Sclerae are anicteric.  NECK: No jugular venous distention. There is no thyromegaly. Trachea is midline. The carotids are 2+ and symmetric.  LUNGS: Clear bilaterally to auscultation. No wheezes, rales or rhonchi are present. There is no increased work of breathing.  CARDIOVASCULAR: Regular rate and rhythm, somewhat distant heart sounds, normal S1 and S2.  ABDOMEN: Obese, nontender, nondistended. There is no organomegaly. Bowel sounds are present. There is no rebound or guarding.  EXTREMITIES: No cyanosis, clubbing or edema. Pulses are 1+ and symmetric.  SKIN: Normal, except for erythematous facies.  LABORATORY AND DIAGNOSTIC DATA: EKG demonstrates sinus rhythm. His laboratory evaluation is negative for acute MI and otherwise unremarkable.   IMPRESSION:  1.  Chest pain in the setting of known coronary artery disease: Even though his initial  enzymes are negative, his chest pain is worrisome and I would recommend serial cardiac enzymes and  would suggest holding his warfarin and proceeding with catheterization. Dr. [name] will follow up with him in the next few days.  2.  Syncope: The etiology of his syncope is unclear. If is heart catheterization is unremarkable, would obtain a 2-D echo as well and if all negative, would consider insertion of an implantable loop recorder.  3.  Obesity: The patient will need to lose weight, as he is chronically overweight.  4.  Sleep apnea: The patient has not been wearing his CPAP. It needs to be refitted, as he is intolerant of his current unit.   ____________________________ Champ Mungo. Lovena Le, MD gwt:jm D: 03/20/2013 13:58:28 ET T: 03/20/2013 15:19:00 ET JOB#: [id number]  cc: Champ Mungo. Lovena Le, MD, <Dictator> Dr. Cristopher Peru, [name] ELECTRONICALLY SIGNED 05/11/2013 16:08

## 2014-12-02 NOTE — Consult Note (Signed)
dvt:    pulmonary emboli:    seizure:    cellulitis:    A Fib: 26-Nov-2011   High Factor 8:    Back Pain, Chronic:    Crohn's:    GERD - Esophageal Reflux:    Hypertension:    Crohn's Disease:    IV filter:    Back Surgery:    Cholecystectomy:    Colon Resection:    CABG:    IVF:     Ondansetron injection,  ( Zofran injection )  4 mg, IV push, once  Indication: Nausea/ Vomiting, 16-Oct-2012, Completed, Standard   Fosphenytoin injection,  ( CereBYX injection )  1000 mg/pe in Dextrose 5% 100 ml, IV Piggyback, once, Infuse over 10 minute(s)  Indication: Seizures, 17-Oct-2012, Completed, Standard   Acetaminophen * tablet, ( Tylenol (325 mg) tablet)  650 mg Oral q4h PRN for pain or temp. greater than 100.4  - Indication: Pain/Fever, 17-Oct-2012, Active, Standard   Docusate Sodium capsule, ( Colace)  100 mg Oral bid PRN for constipation  - Indication: Stool Softener, 17-Oct-2012, Active, Standard   Enoxaparin injection, ( Lovenox injection )  40 mg, Subcutaneous, daily  Indication: Prophylaxis or treatment of thromboembolic disorders, Monitor Anticoags per hospital protocol, 17-Oct-2012, Discontinued, Standard   Insulin SS -Novolog injection, Subcutaneous, FSBS before meals and at bedtime  0 units if FSBS 0-150     2 unit(s) if FSBS 151 - 200     4 unit(s) if FSBS 201 - 250     6 unit(s) if FSBS 251 - 300     8 unit(s) if FSBS 301 - 350     10 unit(s) if FSBS 351 - 400  Call MD if FSBS is greater than 400, [Waste Code: Black], 17-Oct-2012, Active, Standard   Ondansetron injection, ( Zofran injection )  4 mg, IV push, q4h PRN for Nausea/Vomiting  Indication: Nausea/ Vomiting, 17-Oct-2012, Active, Standard   Senna tablet, ( Senokot)  1 tablet(s) Oral bid PRN for constipation  - Indication: Stool Softner/ Constipation/ Bowel Prep for Surgery  Instructions:  1 tablet = 8.6 mg, 17-Oct-2012, Active, Standard   Amiodarone tablet, ( Pacerone)  50 mg Oral  daily  - Indication: Tachycardia/ Atrial Fibrillation, 17-Oct-2012, Active, Standard   DimenhyDRINATE tablet, ( Dramamine)  50 mg Oral q6h PRN for nausea  - Indication: Nausea/ Vomiting/ Motion Sickness  Instructions:  nausea, 17-Oct-2012, Active, Standard   Omeprazole capsule, ( PriLOSEC)  40 mg Oral q6am  - Indication: GERD  Instructions:  DO NOT CRUSH, 17-Oct-2012, Active, Standard   Warfarin tablet,  ( Coumadin)  5 mg Oral q5pm  - Indication: Anticoagulant, Monitor Anticoags per hospital protocol  Instructions:  Dustin Folks Code: Black with pkg], 17-Oct-2012, Active, Standard  Home Medications: Medication Instructions Status  predniSONE 40 mg oral tablet (once in AM and once in PM 48 hrs prior to HUMIRA injection)  Active  Humira Pen 40 mg/0.8 mL subcutaneous kit twice a month  Active  Coumadin 5 mg oral tablet Sun, Mon, Tues, Thurs, Friday 1 tab(s)  once a day Active  simvastatin 20 mg oral tablet  Active  aspirin 81 mg oral tablet, chewable  Active  nitrostat 0.4 mg sl q5 PRN  Active  folic acid 1 mg po daily  Active  Caltrate 600 with D 600 mg-400 intl units oral tablet 1  orally once a day  Active  multivitamin   once a day  Active  amiodarone 100 mg oral tablet 1 tab(s) orally every  other day Active  amiodarone 50 milligram(s) orally every other day Active  hydrochlorothiazide-losartan 1 tab(s) orally once a day Active  Zantac 150 mg and benadryl 65m tab(s) orally 2 times a month Active  Vitamin D3 1000 intl units oral capsule 1 cap(s) orally once a day Active  Dramamine 50 mg oral tablet 1 tab(s) orally every 6 hours, As Needed Active  Flonase 50 mcg/inh nasal spray 1 spray(s) nasal once a day, As Needed Active  Claritin 10 mg oral tablet 1 tab(s) orally once a day Active  omeprazole 40 mg oral delayed release capsule 1 cap(s) orally once a day Active  Toprol-XL 1  orally-37.544mdaily Active   Lab Results: Hepatic:  07-Mar-14 20:02   Bilirubin, Total 0.6  Alkaline  Phosphatase 70  SGPT (ALT) 64  SGOT (AST)  45  Total Protein, Serum 7.1  Albumin, Serum 3.4  Routine Chem:  07-Mar-14 20:02   Glucose, Serum  122  BUN 16  Creatinine (comp) 1.06  Sodium, Serum  124  Potassium, Serum 4.0  Chloride, Serum  90  CO2, Serum 23  Calcium (Total), Serum 8.6  Osmolality (calc) 252  eGFR (African American) >60  eGFR (Non-African American) >60 (eGFR values <6024min/1.73 m2 may be an indication of chronic kidney disease (CKD). Calculated eGFR is useful in patients with stable renal function. The eGFR calculation will not be reliable in acutely ill patients when serum creatinine is changing rapidly. It is not useful in  patients on dialysis. The eGFR calculation may not be applicable to patients at the low and high extremes of body sizes, pregnant women, and vegetarians.)  Result Comment potassium/ast - Slight hemolysis, interpret results with  - caution.  Result(s) reported on 16 Oct 2012 at 08:45PM.  AniCleta Alberts  Cardiac:  08-Mar-14 05:55   CK, Total 107  CPK-MB, Serum 1.5 (Result(s) reported on 17 Oct 2012 at 06:36AM.)  Troponin I < 0.02 (0.00-0.05 0.05 ng/mL or less: NEGATIVE  Repeat testing in 3-6 hrs  if clinically indicated. >0.05 ng/mL: POTENTIAL  MYOCARDIAL INJURY. Repeat  testing in 3-6 hrs if  clinically indicated. NOTE: An increase or decrease  of 30% or more on serial  testing suggests a  clinically important change)    13:06   CK, Total 113  CPK-MB, Serum 2.0 (Result(s) reported on 17 Oct 2012 at 01:34PM.)  Troponin I < 0.02 (0.00-0.05 0.05 ng/mL or less: NEGATIVE  Repeat testing in 3-6 hrs  if clinically indicated. >0.05 ng/mL: POTENTIAL  MYOCARDIAL INJURY. Repeat  testing in 3-6 hrs if  clinically indicated. NOTE: An increase or decrease  of 30% or more on serial  testing suggests a  clinically important change)   Radiology Results:  Radiology Results: XRay:    08-Mar-14 00:38, Chest Portable Single View  Chest  Portable Single View  REASON FOR EXAM:    cough, congestion  COMMENTS:       PROCEDURE: DXR - DXR PORTABLE CHEST SINGLE VIEW  - Oct 17 2012 12:38AM     RESULT: Comparison is made to the study of 26 November 2011. The cardiac   silhouette is enlarged. Sternotomy wires are present. There is elevation   of the right hemidiaphragm which is unchanged. The lungs appear grossly   clear. There is no significant effusion evident on this single view.    IMPRESSION:   1. Cardiomegaly with stable elevation of the right hemidiaphragm.    Dictation Site: 6    Verified By: GEOSundra Aland  [name], MD  [place]:    08-Mar-14 04:17, US Carotid Doppler Bilateral  US Carotid Doppler Bilateral  REASON FOR EXAM:    syncope  COMMENTS:       PROCEDURE: [place]  - US CAROTID DOPPLER BILATERAL  - Oct 17 2012  4:17AM     RESULT: Carotid Doppler interrogation demonstrates areas of   atherosclerotic plaque with calcification in the common carotid and   internal carotids bilaterally without visual evidence of a high degree of   stenosis. The color and spectral Doppler appearance is normal   bilaterally. Antegrade flow is noted in both vertebral arteries without   flow reversal. The peak systolic velocities are normal in the common and   internal carotids bilaterally. The internal to common carotid peak   systolic velocity ratios are 0.65 on the right and 0.57 on the left.    IMPRESSION:   1. Atherosclerotic disease present without evidence of hemodynamically   significant stenosis.    Dictation Site: 6        Verified By: Sundra Aland, [name], MD  LabUnknown:    07-Mar-14 20:34, CT Head Without Contrast  PACS Image    08-Mar-14 00:38, Chest Portable Single View  PACS Image    08-Mar-14 04:17, US Carotid Doppler Bilateral  PACS Image  CT:    07-Mar-14 20:34, CT Head Without Contrast  CT Head Without Contrast  REASON FOR EXAM:    seizure  COMMENTS:   May transport without cardiac  monitor    PROCEDURE: CT  - CT HEAD WITHOUT CONTRAST  - Oct 16 2012  8:34PM     RESULT: Noncontrast CT of the brain demonstrates prominence of the   ventricles and sulci consistent with atrophy. There is no intracranial   hemorrhage, mass, midline shift or evolving infarct. The included sinuses   and mastoid air cells show grossly normal aeration. The calvarium is   intact. Orbital structures appear unremarkable.    IMPRESSION:   1. Changes of atrophy and chronic microvascular ischemic disease. No   acute intracranial abnormality evident.  Dictation Site: 6        Verified By: Sundra Aland, [name], MD    Mesalamine: Hives  Purinethol: Hives  Asacol: Headaches  Codeine: GI Distress  Methotrexate: Hives, Rash  Remicade: Rash, Other  Pyridostigmine: Unknown  Flagyl: Unknown  Diltiazem: Rash, Itching  Mercaptopurine: Hives  Doxycycline: Rash  infliximab: Rash  Metronidazole: Rash  Nursing Flowsheets: **Vital Signs.:   08-Mar-14 15:56  Vital Signs Type Routine  Celsius 36.8  Temperature Source oral  Pulse Pulse 61  Respirations Respirations 18  Systolic BP Systolic BP 412  Diastolic BP (mmHg) Diastolic BP (mmHg) 76  Mean BP 100  Pulse Ox % Pulse Ox % 95  Pulse Ox Activity Level  At rest  Oxygen Delivery 2L    Present Illness Patient is a 74 yo with a history of syncope in the past.  Followed by Veatrice Bourbon.  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  Last syncopal spell in October.  Cartotid USN in6/13 with mild dz; patent vertebral a.; MRI reported negative. He also has a history of CAD (CABG in 2008; cath in 2010 with patent grafts); afib (Dx APril 2013.  Per wife does not always sense palpitations) The patient and wife reprot that he has not felt well for the past week.  Has been weaker, nauseated, more SOB, Last week did have some slurring  of speech and difficulty finding words as well as drooling. Set up for MRI He was seen by  primary MD last week  BP labile 140 to 170.  HCTZ was added to regimen Yesterday He had episode of dizziness, nausea while tryign to light fire.  Sat down  Went away.  Occurred again later in AM  Also at lunch.  Last evening eating out developed nausea and dizziness.  Walked out to [employer] lot with daughter to get some air. Became worse and slipped to floor.  Eyes rolled back  Incontinent of urine.  EMS called.  He denies CP.  Wife says that he had more of these symptoms like yesterday before he had CABG in 2008.   Case History and Physical Exam:  Chief Complaint Patient is a 74 yo who presents for evaluation of syncope.   Past Medical Health Coronary Artery Disease, Hypertension, Diabetes Mellitus, Syncope (neurocardiogenic), atrial fibrillation Crohn's disease, DVT/PE,   Past Surgical History Coronary Artery Bypass Graft  Cholecystectomy  Cataract, colon resection   Family History Hypertension  mother with brain tumor   HEENT PERLA   Neck/Nodes Supple  JVP is normal   Chest/Lungs Clear   Cardiovascular RRR  Normal S1, S2, no S3.  No significant murmurs   Abdomen Mild tenderness along inguinal line bilaterall.  Normal BS  No masses   Genitalia Not examined   Rectal Not examined   Musculoskeletal Full range of motion   Neurological Grossly WNL   Skin Warm    Impression Patient is a complicated 74 yo with history of CAD, afib, DVT/PE, and neurocardiogenic syncope PResents after 1 wk of not feeling well.  Now with recurrent syncope.  Most likely again neurocardiogenic. Admission EKG with SR.  Tele also with SR 60s.  No signif pauses  No afib. Head CT and carotids without signif abnormalities.  Echo pending. Signif if finding of Na 122.  I do not think it explains yesterdays events but with history of syncope I would not recomm HCTZ   Plan I would recomm continuing telemetry.  Follow for recurrent afib, possible pauses I would get orthostatic VS today and in AM Follow NA off  of HCTZ I am not convinced ischemia lead to yesterdays events but with history in 2008 would prob set up for myoview on MOnday. With speech issues last week I would keep MRI appt. WIll continue to follow Keep on amiodarone 200/100 alternating  Continue coumadin.   Electronic Signatures: Dorris Carnes (MD)  (Signed 08-Mar-14 17:39)  Authored: Significant Events - History, Medications, Home Medications, Labs, Radiology Results, Allergies, Vital Signs, General Aspect/Present Illness, History and Physical Exam, Impression/Plan   Last Updated: 08-Mar-14 17:39 by Dorris Carnes (MD)

## 2014-12-02 NOTE — Discharge Summary (Signed)
PATIENT NAME:  [name] [name], [name] [name] MR#:  [id number] DATE OF BIRTH:  [birth year]  DATE OF ADMISSION:  03/20/2013 DATE OF DISCHARGE:  03/23/2013  DISCHARGE DIAGNOSES: 1.  Syncopal episode possible due to arrhythmia or seizure, a history of seizure in past.  2.  Coronary artery disease status post coronary artery bypass grafting, negative cardiac catheterization on 03/23/2013. 3.  A history of deep vein thrombosis and pulmonary embolus.   CODE STATUS:  FULL CODE.   CONDITION ON DISCHARGE:  Stable.   DISCHARGE MEDICATIONS:  1.  Folic acid 1 mg daily.  2.  Nitrostat 0.4 mg sublingual q.5 minutes p.r.n.  3.  Prednisone 40 mg oral tablet once in the morning 48 hours before Humira injections.  4.  Multivitamin tablet once a day.  5.  Vitamin D 3000 international units oral capsule once a day.  6.  Flonase 50 mcg nasal spray once a day.  7.  Claritin 10 mg once a day.  8.  Omeprazole 40 mg once a day.  9.  Amiodarone 100 mg tablet 1 tablet every other day.  10.  Aspirin 81 mg once a day.  11.  Coumadin 2.5 mg, take 1/2 tablet every other day and Coumadin 5 mg oral tablet Tuesday, Thursday and Saturday.  12.  Simvastatin 40 mg once a day.  13.  Cozaar 100 mg once a day.   ACTIVITY:  As tolerated.   TIMEFRAME TO FOLLOWUP:  1 to 2 weeks, Dr. Lovena Le, INR, is advised to check in 3 to 4 days with PMD as Coumadin was on hold and restarted in hospital. Dr. Lovena Le follow up for loop recording and Dr. Manuella Ghazi in [facility].  HISTORY OF PRESENT ILLNESS:  The patient is a 74 year old male with multiple past medical history including CAD, CABG, recurrent syncope and axillary work-up was done, which was normal. Lexi-scan was done in March 2014, which was normal. Presented to ER around 4:00 a.m. While he was having chest pain and went to the bathroom and he passed out for five minutes. He was on the floor and then he woke up with some confusion. No observed seizures. CAT scan of the head was negative. INR was  2.6 on presentation. EKG did not show any changes and troponin were negative so he was admitted for further work-up by the Cardiology and neurological causes. Initially, Cardiology consult was called in due to his extensive cardiology history and Dr. Lovena Le saw him. He suggested to get cardiac cath as he has extensive history and he had chest pain before passing out episode. There he was monitored on telemetry for almost 3 days and there were no episodes of any arrhythmias on telemetry. His Coumadin was stopped to bring his INR down and which came to 1.7. After 2 days, cardiac cath was done, which was negative for any significant lesion, and he was discharged with advice to follow with Dr. Lovena Le for loop recording. All the cardiac medications were continued upon discharge as he was taking before.   OTHER MEDICAL ISSUES:   1.  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 He was following with Dr. Manuella Ghazi, Neurology Clinic, and we advised to follow with him next week as this might be a seizure episode also.  2.  Diet controlled diabetes. Blood sugar was under control. 3.  A history of DVT and PE. He was taking Coumadin and discharged on the same.  [place]:  WBC was 9.3. Hemoglobin was 14.7 and platelet  count was 245, BUN was 11, creatinine was 1.12 on admission. Troponin less than 0.02. INR was 2.6 on admission. BNP was 57. A CT of the head was negative. Troponin remained negative and his INR came down after holding the Coumadin, and his HbA1c was 6.9. Cardiac cath was done, which was negative for any significant findings so discharged home. CT of the cervical spine without contrast was negative. Discharged home without any further workup and advised to follow in Cardiology and Neurology Clinic.   TOTAL TIME SPENT ON THIS DISCHARGE:  45 minutes.   ____________________________ Ceasar Lund Anselm Jungling, MD [id number] D: 03/25/2013 12:08:00 ET T: 03/25/2013 12:46:37  ET JOB#: [id number]  cc: Ceasar Lund. Anselm Jungling, MD, <Dictator> [place] M[place]o. Lovena Le, MD Richard L. Rosanna Randy, MD, Family physician's office [place] K. Manuella Ghazi, MD Vaughan Basta MD ELECTRONICALLY SIGNED 04/13/2013 0:37

## 2014-12-02 NOTE — H&P (Signed)
PATIENT NAME:  [name] [name], [name] [name] MR#:  [id number] DATE OF BIRTH:  1941-01-23  DATE OF ADMISSION:  10/17/2012  PRIMARY CARE PHYSICIAN:  Dr. Miguel Aschoff.  PRIMARY CARDIOLOGIST:  Dr. Nada Boozer.   CHIEF COMPLAINT:  Loss of consciousness.   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 The patient has been experiencing these symptoms since 2008 prior to having the coronary artery bypass surgery. This starts as a severe dizziness, nausea, severe generalized weakness. After the artery bypass surgery in 2008, the patient had significant improvement with these symptoms. He started to experience symptoms for the last two months. Concerned with this went to his cardiologist, was placed on the Holter monitor, however, could not find out the exact reason for these symptoms. For the last one week has been feeling somewhat sluggish. About four days back complained of headache and after that somewhat felt a decreased responsiveness. This morning, the patient was trying to light the fireplace, started to experience the dizziness and nausea. Concerned with this went and sat down on the couch. These symptoms would resolve after 30 minutes. The patient's wife did not check his vital signs at this time. The similar episodes occurred about noontime after eating lunch. This evening they went to [employer] where they were waiting for their seat. He started to experience severe dizziness, nausea. Asked his daughter to take him outside for the fresh air. As they were taking him outside, felt severe generalized weakness and slipped down to the floor. The patient had rolled back  his eyes and chewing of his tongue. There was no generalized tonic-clonic seizures. However, patient had urinary incontinence. Concerning this, EMS was called and was brought to the Emergency Department where the workup in the Emergency Department with a CT head, EKG, cardiac enzymes were unremarkable. The patient was found to have sodium of 122. Denies having any recent cough, cold, denies having any nausea, vomiting. The patient was recently started on hydrochlorothiazide about 4 to 5 months back. He denies having any PND, orthopnea. The patient also had in the past extensive workup done for the cause of the syncope including MRI of the brain, carotid Dopplers and EEG. Per the patient's wife they were all unremarkable. We will need to obtain records from the primary care physician's office. As mentioned above, the patient was also followed on event monitor or heart monitoring, however, the exact cause was not found. The patient denies having any chest pain or palpitations at this time. The only time the patient does not recall the events was when started having rolling of his eyes to the time the patient was brought to the Emergency Department. Per patient's wife after regained consciousness, started having illogical speech. Was not noted to have any weakness in any part of the body.   PAST MEDICAL HISTORY:  1.  Crohn's disease.  2.  A history of DVT, PE.  3.  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  4.  Coronary artery disease a status post coronary artery bypass grafting in 2008. 5.  Hyperlipidemia.  6.  Gastroesophageal reflux disease.  7.  Hypertension.  8.  Obesity.  9.  A  history of syncope in the past.   PAST SURGICAL HISTORY:   1.  Bilateral cataract surgery with a lens implant. 2.  Coronary artery bypass grafting.  3.  Cholecystectomy.  4.  Benign colon cancer status post resection.  5.  Back surgery x 2.   HOME  MEDICATIONS:  ALLERGIES: 1.  CODEINE. 2.  ASACOL.  3.  PURINETHOL. 4.  MESALAMINE.  5.  METACARPAL.  6.  METHOTREXATE.  7.  DOXYCYCLINE. 8.   INFLIXIMAB.  9.  (DICTATION ANOMALY) <<MISSING TEXT>>  10.  DILTIAZEM. 11.  REMICADE.  12.  FLAGYL.  13.  PYRIDOSTIGMINE.   HOME MEDICATIONS:   1.  Zantac 150 mg 2 times a day.  2.  Vitamin D3 1000 mg once a day.  3.  Simvastatin 20 mg daily.  4.  Prednisone 40 mg daily.  6.  Omeprazole 40 mg daily.  7.  Nitrostat 0.4 mg sublingual every 5 minutes as needed.  8.  Multivitamin.  9.  Hydrochlorothiazide 1 tablet once a day.  10.  Zometa 40 mg daily.  11.  Folic acid 1 mg daily.  12.  Flonase 50 mcg one spray daily.  13.  Coumadin 5 mg daily.  14.  Claritin 10 mg oral once a day.  15.  Caltrate 600 mg with vitamin D 1 tablet daily.  16.  Aspirin 81 mg daily.  17.  Amiodarone 100 mg 1 tablet every other day.  18.  (Dictation Anomaly) <<MISSING TEXT>> 50 mg every other day.   SOCIAL HISTORY:  Lives in [place] with his wife, Stanton Kidney. He smoked in [employer]) <<MISSING TEXT>>. Denies drinking alcohol or using illicit drugs.   FAMILY HISTORY:  Significant for hypertension. Mother had a brain tumor.   REVIEW OF SYSTEMS: CONSTITUTIONAL:  No fever, chills. EYES:  No glaucoma.  ENT:  No sore throat.  RESPIRATORY:  No cough, hemoptysis.  CARDIOVASCULAR:  No chest pain, palpitations.  ENDOCRINE:  No polyphagia, polydipsia.  HEMATOLOGIC:  No easy bleeding. SKIN:  No ulcers.  MUSCULOSKELETAL:  Has chronic back pain.  NEUROLOGIC:  No weakness in any part of the body.   PHYSICAL EXAMINATION.  GENERAL:  This is a well built, well nourished, age-appropriate male laying down in the bed, not in distress.  VITAL SIGNS:  Temperature 98, pulse 64, blood pressure 147/77, respiratory rate of 16, oxygen saturation is 97% on room air.  HEENT:  Head normocephalic, Conjunctivae normal. Pupils equal and reactive to light. Mucous membranes moist.   NECK:  Supple. No lymphadenopathy. No JVD. No carotid bruit.  CHEST:  Has no focal tenderness. LUNGS:  Bilateral clear to auscultation.  HEART:  S1, S2 regular, no murmurs are heard.  ABDOMEN:  Bowel sounds present. Soft, nontender, nondistended.  EXTREMITIES:  No pedal edema. Pulses 2+. NEUROLOGIC:  The patient is alert and oriented to place, person and time. Cranial nerves II through XII intact. No motor and sensory deficits.   LABORATORY, DIAGNOSTIC, AND RADIOLOGICAL DATA:  CMP:  Sodium 122, the rest of all the values are within normal limits. CBC:  WBC is 6.3, hemoglobin 15.2, platelet count of 278.   EKG 12-lead:  Normal sinus rhythm, sinus bradycardia with a heart rate of 54.   CT head without contrast:  No acute intracranial abnormality.   ASSESSMENT AND PLAN:  The patient issue a 74 year old male with multiple medical problems who comes to the Emergency Department after having 3 episodes of syncope. One syncope highly concerning for cardiovascular. Admit the  patient to the telemetry. We will obtain echocardiogram. We will also obtain carotid Dopplers. The patient does not have any neuro deficits. Consider consulting (Dictation Anomaly) <<MISSING TEXT>> Group. The patient had an EEG in the past, which was negative for any seizures.   Hyponatremia. The patient was recently started on hydrochlorothiazide. Discontinue the hydrochlorothiazide and continue the current medications.   Coronary artery disease a status post CABG. The patient denies having any current chest pain or shortness of breath.   A history of deep vein thrombosis. The patient currently has therapeutic INR. We to continue to follow up.   Diabetes mellitus. We will keep the patient on sliding scale insulin as well as continue the home medications.   The patient is already on therapeutic INR.   ____________________________ Monica Becton, MD pv:jm D: 10/17/2012 03:56:00 ET T: 10/17/2012 09:57:38  ET JOB#: [id number]  cc: Monica Becton, MD, <Dictator>

## 2014-12-02 NOTE — H&P (Signed)
PATIENT NAME:  [name] [name], [name] [name] MR#:  [id number] DATE OF BIRTH:  [birth year]  DATE OF ADMISSION:  03/20/2013  PRIMARY CARE PHYSICIAN: Dr. Rosanna Randy.  REFERRING PHYSICIAN: Dr. Corky Downs.   CHIEF COMPLAINT: Chest pain and passing out.   HISTORY OF PRESENT ILLNESS: The patient is a 74 year old Caucasian male with multiple medical problems including coronary artery disease, status post CABG, recurrent history of syncope in the past with extensive work-up which was normal. Recent history of Lexiscan stress test in March 2014, which was normal.  He is presenting to the ER with a chief complaint of chest pain. The patient is reporting that at around 4:00 a.m. today, he started having chest pain. He went to the bathroom.  The patient has reported that he felt like his legs were weak while he was on his way to the bathroom and than subsequently he passed out. The wife heard a loud noise and ran to the bathroom. The patient was on the floor, next to the commode. He hit his head against the wall and was making some gurgling noises. The wife thinks he passed out approximately for 5 minutes and spontaneously recovered back. He was complaining of midsternal chest pressure and was cold and clammy. Wife immediately called paramedics and he was brought into the ER. The chest pain resolved after getting sublingual nitroglycerin the ER. The patient has remote history of pulmonary embolism and on Coumadin. His INR is at 2.6. CAT scan of the head is negative. EKG did not reveal any changes. A chest x-ray was normal. Hospitalist team was called to admit the patient. During my examination, the patient denies any chest pain, shortness of breath, dizziness. Denies any palpitations either.  His is compliant with his medication. During his recent admission in March 2014, the patient's hydrochlorothiazide was discontinued in view of chronic hyponatremia. Otherwise no complaints and resting comfortably.   PAST MEDICAL HISTORY: Coronary artery  disease, status post CABG, recurrent syncope, obstructive sleep apnea, factor V Leiden mutation, Crohn disease, history of DVT and pulmonary embolism, GERD, hypertension and obesity.   PAST SURGICAL HISTORY: Bilateral cataract surgery with lens implants, coronary artery bypass grafting with quadruple bypass surgery, cholecystectomy, benign colon cancer status post resection, back surgery x2.   ALLERGIES:  ASACOL, CODEINE , DOXYCYCLINE, FLAGYL,  INFLIXIMAB, MESALAMINE, AND METHOTREXATE.   HOME MEDICATIONS: Amiodarone 200 mg every other day, 100 mg every other day, Coumadin, losartan 100 mg once daily, omeprazole 40 mg q. 6 a.m., simvastatin 40 mg p.o. daily, Zantac 150 mg 2 times per day, prednisone 40 mg once daily, Nitrostat 0.4 mg sublingually as needed. The patient is on Humira pen, aspirin 81 mg once daily.   PSYCHOSOCIAL HISTORY: Lives at home with wife.  Denies any history of smoking, alcohol or illicit drug usage.   FAMILY HISTORY: Hypertension, mother had brain tumor.   REVIEW OF SYSTEMS: CONSTITUTIONAL: Denies any fever or fatigue.  EYES: Denies blurry vision, glaucoma.  EARS, NOSE, THROAT: No epistaxis or discharge.  RESPIRATORY: Denies cough, COPD. Has obstructive sleep apnea.  CARDIOVASCULAR: Chest pain has resolved with sublingual nitroglycerin. Had syncope. Denies palpitations.  GASTROINTESTINAL: No nausea, vomiting, diarrhea.  GENITOURINARY: No dysuria, hematuria or hernia.  ENDOCRINE: Denies polyuria, nocturia, thyroid problems.  HEMATOLOGIC:  No anemia, easy bruising.  MUSCULOSKELETAL: No joint pain in the neck or back. Denies gout.  NEUROLOGIC: No history of ataxia or dementia.  PSYCHIATRIC: No ADD, OCD.   PHYSICAL EXAMINATION  VITAL SIGNS: Temperature 98.4, pulse 59, respirations 18, blood  pressure is 110/53, pulse is 93% on 2 liters.  GENERAL APPEARANCE: Not in acute distress. Moderately built and obese.   HEENT: Normocephalic. Has small bruising and abrasions on the  left side of his forehead as he sustained a fall. Pupils are equally reacting to light and accommodation. No scleral icterus. No conjunctival injection. No sinus tenderness. No postnasal drip.  NECK: Supple. No JVD. Range of motion is intact. No thyromegaly.  LUNGS: Clear to auscultation bilaterally. No accessory muscle use and no anterior chest wall tenderness on palpation.  CARDIAC: S1, S2 normal. Regular rate and rhythm. No murmurs.  GASTROINTESTINAL: Soft. Bowel sounds are positive in all 4 quadrants. Nontender, nondistended. No hepatosplenomegaly.  NEUROLOGIC: Awake, and oriented x3. Motor and sensory are grossly intact. Reflexes are 2+.  EXTREMITIES: No edema. No cyanosis. No clubbing.  MUSCULOSKELETAL: No joint effusion. No tenderness.  SKIN: Normal turgor and no rashes.  PSYCHIATRIC: Normal mood and affect.   LABS AND REMAINING STUDIES: A 12-lead EKG: Normal sinus rhythm. No acute ST-T wave changes.  It was repeated again and showed left axis deviation and prolonged QT with normal sinus rhythm.  Chest x-ray:  No acute findings. Glucose 126, BNP 57, BUN 11, creatinine 1.1, sodium 131, potassium 4.7, chloride 100, anion gap 6, GFR greater than 60, serum osmolality 264, calcium 8.6, troponin less than 0.02. CBC normal. INR 2.6, PT 27.3.   ASSESSMENT AND PLAN: A 74 year old Caucasian male brought into the ER after he was syncopized while he was going to the bathroom and complaining of chest pain. Will be admitted with the following assessment and plan.   1.  Chest pain, rule out acute coronary syndrome: Will admit him to telemetry. Cycle cardiac biomarkers. Acute coronary syndrome protocol with oxygen, nitroglycerin, aspirin beta blocker and statin. Cardiology consult placed with Dr. [name]. Recent stress test done in March 2014, was normal.   2.  Syncope. Probably cardiogenic. Differential can be neurogenic, cardiac arrhythmias,  orthostatic hypotension.   We will check orthostatics. CT head  is negative in the ER. The patient had extensive work-up in the past including EEG for recurrent syncopal attacks. Please review Dr. Ward Givens history and physical from March 2014. All the workup was negative. Etiology is unclear. Other differentiating features can be reflex sympathetic dystrophy/autonomic.  3.  Coronary artery disease with history of coronary artery bypass graft. Currently the patient is chest pain-free. We will implement acute coronary syndrome protocol.   4.  Diet-controlled diabetes mellitus. We will check hemoglobin A1c, and put him on sliding scale insulin.   5.  History of Crohn disease. The patient is on prednisone.   6.  History of deep venous thrombosis and pulmonary embolism. The patient is on Coumadin. INR is therapeutic. We will check daily PTs and dose Coumadin per pharmacy. HE IS FULL CODE. Wife is medical power of attorney. Diagnosis and plan of care were discussed in detail with the patient and his wife at bedside. They both verbalized understanding of the plan.  Total time spent on admission is 50 minutes.   ____________________________ Nicholes Mango, MD ag:dp D: 03/20/2013 07:39:22 ET T: 03/20/2013 08:38:29 ET JOB#: [id number]  cc: Nicholes Mango, MD, <Dictator> Minna Merritts, MD Richard L. Rosanna Randy, MD Nicholes Mango MD ELECTRONICALLY SIGNED 03/25/2013 5:52

## 2014-12-02 NOTE — Consult Note (Signed)
Chief Complaint:  Subjective/Chief Complaint Still feeling weak.  NO CP  Breathing stable.   VITAL SIGNS/ANCILLARY NOTES: **Vital Signs.:   09-Mar-14 04:23  Vital Signs Type Routine  Temperature Temperature (F) 97.7  Celsius 36.5  Temperature Source oral  Respirations Respirations 18  Systolic BP Systolic BP 502  Diastolic BP (mmHg) Diastolic BP (mmHg) 86  Pulse Lying Pulse Lying 61  Systolic BP Systolic BP 774  Diastolic BP (mmHg) Diastolic BP (mmHg) 84  Pulse Pulse Sitting 71  Systolic BP Systolic BP 128  Diastolic BP (mmHg) Diastolic BP (mmHg) 81  Pulse Standing Pulse Standing 77  Pulse Ox % Pulse Ox % 95  Pulse Ox Activity Level  At rest  Oxygen Delivery 2L    07:30  Vital Signs Type Routine  Temperature Temperature (F) 98.3  Celsius 36.8  Temperature Source oral  Pulse Pulse 64  Respirations Respirations 20  Systolic BP Systolic BP 786  Diastolic BP (mmHg) Diastolic BP (mmHg) 74  Mean BP 94  Pulse Ox % Pulse Ox % 94  Pulse Ox Activity Level  At rest  Oxygen Delivery 2L   Physical Exam:  GEN no acute distress   NECK JVP is normal.   RESP clear BS   CARD regular rate  no murmur  No LE edema  no JVD   ABD denies tenderness  No hepatomegaly   EXTR negative edema   Additional Comments Tele:  SR No pauses  NO afib  HR 70s   Lab Results: Routine Chem:  09-Mar-14 03:33   Glucose, Serum 88  BUN 15  Creatinine (comp) 1.11  Sodium, Serum  128  Potassium, Serum 4.1  Chloride, Serum  95  CO2, Serum 23  Calcium (Total), Serum  8.4  Anion Gap 10 (Result(s) reported on 18 Oct 2012 at 04:44AM.)  Osmolality (calc) 257  Magnesium, Serum  1.6 (1.8-2.4 THERAPEUTIC RANGE: 4-7 mg/dL TOXIC: > 10 mg/dL  -----------------------)  Hemoglobin A1c ([facility])  6.7 (The American Diabetes Association recommends that a primary goal of therapy should be <7% and that physicians should reevaluate the treatment regimen in patients with HbA1c values consistently >8%.)    Assessment/Plan:  Assessment/Plan:  Assessment 1.  Syncope.  Orthostatics early this AM were borderline  Repeat pending I have reviewed echo.  Overall normal LV and RV function. Episode may still reflect patients autonomic dysfunction  Would repeat orthostatics Given his weakness and similarity to symptoms prior to CABG would sched for [facility] tomorrow.  I am not convinced though that represents ischemia  2.  CAD  Myoview tomorrow  If there is ischemia I would expect a focal abnormality.  Daughter reports that stress test prior to CABG was normal  I do not have report but may reflect diffuse disease.  3.  Neuro.  Patient's wife reports transient speech issues and drooling last week  WOuld keep appt for MRI  4.  Afib  Remains in SR  Keep on amio 200 for now  Keep on anticoag.  5.  DVT/PE  Keep on anticoag   Electronic Signatures: Dorris Carnes (MD)  (Signed 09-Mar-14 15:47)  Authored: Chief Complaint, VITAL SIGNS/ANCILLARY NOTES, Physical Exam, Lab Results, Assessment/Plan   Last Updated: 09-Mar-14 15:47 by Dorris Carnes (MD)

## 2014-12-02 NOTE — H&P (Signed)
PATIENT NAME:  [name] [name], [name] [name] MR#:  [id number] DATE OF BIRTH:  1940/10/08  DATE OF ADMISSION:  10/17/2012  PRIMARY CARE PHYSICIAN:  Dr. Miguel Aschoff.  PRIMARY CARDIOLOGIST:  Dr. Nada Boozer.   CHIEF COMPLAINT:  Loss of consciousness.   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 The patient has been experiencing these symptoms since 2008 prior to having the coronary artery bypass surgery. This starts as a severe dizziness, nausea, severe generalized weakness. After the artery bypass surgery in 2008, the patient had significant improvement with these symptoms. He started to experience symptoms for the last two months. Concerned with this went to his cardiologist, was placed on the Holter monitor, however, could not find out the exact reason for these symptoms. For the last one week has been feeling somewhat sluggish. About four days back complained of headache and after that somewhat felt a decreased responsiveness. This morning, the patient was trying to light the fireplace, started to experience the dizziness and nausea. Concerned with this went and sat down on the couch. These symptoms would resolve after 30 minutes. The patient's wife did not check his vital signs at this time. The similar episodes occurred about noontime after eating lunch. This evening they went to [employer] where they were waiting for their seat. He started to experience severe dizziness, nausea. Asked his daughter to take him outside for the fresh air. As they were taking him outside, felt severe generalized weakness and slipped down to the floor. The patient had rolled back  his eyes and chewing of his tongue. There was no generalized tonic-clonic seizures. However, patient had urinary incontinence. Concerning this, EMS was called and was brought to the Emergency Department where the workup in the Emergency Department with a CT head, EKG, cardiac enzymes were unremarkable. The patient was found to have sodium of 122. Denies having any recent cough, cold, denies having any nausea, vomiting. The patient was recently started on hydrochlorothiazide about 4 to 5 months back. He denies having any PND, orthopnea. The patient also had in the past extensive workup done for the cause of the syncope including MRI of the brain, carotid Dopplers and EEG. Per the patient's wife they were all unremarkable. We will need to obtain records from the primary care physician's office. As mentioned above, the patient was also followed on event monitor or heart monitoring, however, the exact cause was not found. The patient denies having any chest pain or palpitations at this time. The only time the patient does not recall the events was when started having rolling of his eyes to the time the patient was brought to the Emergency Department. Per patient's wife after regained consciousness, started having illogical speech. Was not noted to have any weakness in any part of the body.   PAST MEDICAL HISTORY:  1.  Crohn's disease.  2.  A history of DVT, PE.  3.  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  4.  Coronary artery disease a status post coronary artery bypass grafting in 2008. 5.  Hyperlipidemia.  6.  Gastroesophageal reflux disease.  7.  Hypertension.  8.  Obesity.  9.  A  history of syncope in the past.   PAST SURGICAL HISTORY:   1.  Bilateral cataract surgery with a lens implant. 2.  Coronary artery bypass grafting.  3.  Cholecystectomy.  4.  Benign colon cancer status post resection.  5.  Back surgery x 2.   HOME  MEDICATIONS:  ALLERGIES: 1.  CODEINE. 2.  ASACOL.  3.  PURINETHOL. 4.  MESALAMINE.  5.  METACARPAL.  6.  METHOTREXATE.  7.  DOXYCYCLINE. 8.   INFLIXIMAB.  9.  METRONIDAZOLE.  10.  DILTIAZEM. 11.  REMICADE.  12.  FLAGYL.  13.  PYRIDOSTIGMINE.   HOME MEDICATIONS:   1.  Zantac 150 mg 2 times a day.  2.  Vitamin D3 1000 mg once a day.  3.  Simvastatin 20 mg daily.  4.  Prednisone 40 mg daily.  6.  Omeprazole 40 mg daily.  7.  Nitrostat 0.4 mg sublingual every 5 minutes as needed.  8.  Multivitamin.  9.  Hydrochlorothiazide 1 tablet once a day.  10.  Zometa 40 mg daily.  11.  Folic acid 1 mg daily.  12.  Flonase 50 mcg one spray daily.  13.  Coumadin 5 mg daily.  14.  Claritin 10 mg oral once a day.  15.  Caltrate 600 mg with vitamin D 1 tablet daily.  16.  Aspirin 81 mg daily.  17.  Amiodarone 100 mg 1 tablet every other day.  18.  Amiodarone 50 mg every other day.   SOCIAL HISTORY:  Lives in [place] with his wife, Stanton Kidney. He smoked in the past. Denies drinking alcohol or using illicit drugs.   FAMILY HISTORY:  Significant for hypertension. Mother had a brain tumor.   REVIEW OF SYSTEMS: CONSTITUTIONAL:  No fever, chills. EYES:  No glaucoma.  ENT:  No sore throat.  RESPIRATORY:  No cough, hemoptysis.  CARDIOVASCULAR:  No chest pain, palpitations.  ENDOCRINE:  No polyphagia, polydipsia.  HEMATOLOGIC:  No easy bleeding. SKIN:  No ulcers.  MUSCULOSKELETAL:  Has chronic back pain.  NEUROLOGIC:  No weakness in any part of the body.   PHYSICAL EXAMINATION.  GENERAL:  This is a well built, well nourished, age-appropriate male laying down in the bed, not in distress.  VITAL SIGNS:  Temperature 98, pulse 64, blood pressure 147/77, respiratory rate of 16, oxygen saturation is 97% on room air.  HEENT:  Head normocephalic, Conjunctivae normal. Pupils equal and reactive to light. Mucous membranes moist.  NECK:  Supple. No lymphadenopathy. No JVD. No carotid bruit.  CHEST:  Has  no focal tenderness. LUNGS:  Bilateral clear to auscultation.  HEART:  S1, S2 regular, no murmurs are heard.  ABDOMEN:  Bowel sounds present. Soft, nontender, nondistended.  EXTREMITIES:  No pedal edema. Pulses 2+. NEUROLOGIC:  The patient is alert and oriented to place, person and time. Cranial nerves II through XII intact. No motor and sensory deficits.   LABORATORY, DIAGNOSTIC, AND RADIOLOGICAL DATA:  CMP:  Sodium 122, the rest of all the values are within normal limits. CBC:  WBC is 6.3, hemoglobin 15.2, platelet count of 278.   EKG 12-lead:  Normal sinus rhythm, sinus bradycardia with a heart rate of 54.   CT head without contrast:  No acute intracranial abnormality.   ASSESSMENT AND PLAN:  The patient is a 74 year old male with multiple medical problems who comes to the Emergency Department after having 3 episodes of syncope. One syncope highly concerning for cardiovascular. Admit the patient to the telemetry. We will obtain echocardiogram.  We will also obtain carotid Dopplers. The patient does not have any neuro deficits. Consider consulting [facility]. The patient had an EEG in the past, which was negative for any seizures.   Hyponatremia. The patient was recently started on hydrochlorothiazide. Discontinue the hydrochlorothiazide and continue the current medications.   Coronary artery disease a status post CABG. The patient denies having any current chest pain or shortness of breath.   A history of deep vein thrombosis. The patient currently has therapeutic INR. We to continue to follow up.   Diabetes mellitus. We will keep the patient on sliding scale insulin as well as continue the home medications.   The patient  already has therapeutic INR.   ____________________________ Monica Becton, MD pv:jm D: 10/17/2012 03:56:00 ET T: 10/17/2012 09:57:38 ET JOB#: [id number]  cc: Monica Becton, MD, <Dictator> Monica Becton MD ELECTRONICALLY SIGNED 10/20/2012 7:39

## 2014-12-02 NOTE — Discharge Summary (Signed)
PATIENT NAME:  [name] [name], [name] [name] MR#:  [id number] DATE OF BIRTH:  10-Nov-1940  DATE OF ADMISSION:  10/17/2012 DATE OF DISCHARGE:  10/20/2012  PRIMARY CARE PHYSICIAN: Richard L. Rosanna Randy, MD  DISCHARGE DIAGNOSES: 1.  Recurrent syncope.  2.  Hyponatremia.  3.  Coronary artery disease.  4.  Obstructive sleep apnea.  5.  Factor V Leiden mutation.    CONDITION: Stable.   CODE STATUS: Full code.   HOME MEDICATIONS: Please refer to the medication reconciliation list in the [facility] discharge instruction.    DIET:  Low-fat, low-cholesterol ADA diet.     ACTIVITY: As tolerated.   FOLLOWUP CARE: Follow up with PCP within 1 to 2 weeks. Follow up with Dr. [name] within 1 to 2 weeks. In addition, the patient needs followup sodium level and INR in PCPs or Dr. Donivan Scull office.   REASON FOR ADMISSION: Loss of consciousness.   HOSPITAL COURSE:  1.  The patient is a 74 year old Caucasian male with a history of CAD, status post CABG, hypertension, hyperlipidemia, who presented to the ED with one episode of syncope. Actually, the patient has been experiencing syncope symptoms since 2008 prior to having CABG. After CABG in 2008, the patient had significant improvement with symptoms, but he started to experience symptoms for the last 2 months. He has undergone extensive workup for syncope, but no oblivious etiology was found. The patient was recently started with hydrochlorothiazide (HCTZ) for about 4 to 5 months back. He was found to have a sodium of 122. For detailed history and physical examination, please refer to the admission note dictated by Dr. Monica Becton. On admission date, the patient's CT scan did not show any acute intracranial abnormality. EKG: Normal sinus rhythm at 54. Sodium 122, otherwise negative BMP. CBC showed WBC 6.3, hemoglobin 15.2, platelet 278.   The patient was admitted for syncope and hyponatremia, which is possibly due to hydrochlorothiazide, so hydrochlorothiazide was discontinued  and also the patient has been treated with normal saline IV with a regular diet.  Dr. Fletcher Anon evaluated the patient and suggested to hold hydrochlorothiazide. He suggested stress test, which showed one-vessel disease. Dr. Fletcher Anon evaluated the patient today and suggested the patient may be discharged to home today.   2.  For Factor V Leiden mutation, the patient has been on Coumadin. INR is 3.8 today. The patient was suggested to hold Coumadin today and follow up INR in primary care physician's or Dr. Donivan Scull office.  3.  For diabetes, the patient has been treated with sliding scale. The patient has no symptoms after admission. The patient's last vital signs showed temperature 97.7, blood pressure 144/73, pulse 60 and oxygen saturation 94% on room air. According to Dr. Fletcher Anon, the patient needs to continue amiodarone, but no beta blocker due to bradycardia. He suggested continue amiodarone and losartan. The patient will be discharged to home today. I discussed the patient's discharge plan with the patient and his wife, Dr. Fletcher Anon and case manager.   TIME SPENT: About 38 minutes     ____________________________ Demetrios Loll, MD qc:cc D: 10/20/2012 16:20:00 ET T: 10/20/2012 18:23:07 ET JOB#: [id number]  cc: Demetrios Loll, MD, <Dictator> Demetrios Loll MD ELECTRONICALLY SIGNED 10/20/2012 22:12

## 2014-12-03 NOTE — Consult Note (Signed)
CC: abd pain.  Pt feeling better, ate half a burger for supper.  X-ray with mild ileus of small bowel.  Creat and BUN falling well, WBC down also.  Abd with bowel sounds present, soft distended but not really tender.  Will give suppository tonight.  repeat exam tomorrow. VSS afeb.  Electronic Signatures: Manya Silvas (MD)  (Signed on 17-Mar-15 19:18)  Authored  Last Updated: 17-Mar-15 19:18 by Manya Silvas (MD)

## 2014-12-03 NOTE — Consult Note (Signed)
PATIENT NAME:  [name] [name], [name] [name] MR#:  [id number] DATE OF BIRTH:  11-Aug-1941  DATE OF ADMISSION:  10/24/2013 DATE OF CONSULTATION:  10/25/2013  CONSULTING PHYSICIAN:  [name], MD  REASON FOR REFERRAL:  Nausea, vomiting and diarrhea.   DESCRIPTION: The patient is a 74 year old white male with known history of coronary artery disease and sleep apnea, who also has a history of DVT and pulmonary embolism in the past. He is on chronic Coumadin. Apparently, he developed an acute bout of nausea, vomiting and diarrhea since Thursday. This was associated with some lower abdominal pain and increasing abdominal distention. Prior to that, he was fairly stable. There are no recent medications or antibiotics. There is no food that made him sick. No other relatives were sick. By the time he came in, the nausea and vomiting resolved, but the diarrhea is persistent. It was initially watery, but then green, up to 5 to 6 times a day. He was found to have some evidence of acute renal failure with a creatinine of 2.6 and mild leukocytosis. Therefore, the patient was brought in for further evaluation.   He does have a history of Crohn disease and has been on Humira every 2 weeks. It is unclear exactly when the Crohn disease was diagnosed. Dr. Vira Agar has seen the patient. In fact, the last time the patient saw Dr. Vira Agar was a week and a half ago. In 2008, he had evidence of polyp in the descending colon. He also had a hiatal hernia. When the upper endoscopy and colonoscopy were repeated in 2011, he had evidence of hiatal hernia and gastritis. Colonoscopy was normal. There are no signs or Crohn disease. The patient tells me he was diagnosed with Crohn disease as far back as 2009. I do not have any confirmation of this. He has tried various different medications, which he either could not tolerate or had side effects. The Humira is one biological agent that he can tolerate well.   PAST MEDICAL HISTORY: Notable for heart  disease. He is status post coronary artery bypass surgery. He has had syncopal episodes. He had a pacemaker placed last year. He has sleep apnea, factor V mutation, history of DVT and PE, as well as Crohn disease, hypertension, obesity.   PAST SURGICAL HISTORY: Includes cataract surgery, cholecystectomy, colon resection, although, again, I do not have any confirmation of this. He has had back surgery and pacemaker placed.   He is allergic to ASACOL, DOXYCYCLINE, FLAGYL, REMICADE, MERCAPTOPURINE, REMICAIDE, MESALAMINE, AND METHOTREXATE.   OUTPATIENT MEDICATIONS:  Include Tylenol, alprazolam at bedtime, amiodarone daily, amlodipine 5 mg daily, baby aspirin, carvedilol 3.125 mg twice a day, Benadryl, Humira injections, losartan 100 mg daily, multivitamins, Prilosec 40 mg daily, Zocor 40 mg at bedtime, Coumadin 2.5 mg every day except for Wednesday and Saturdays, Humira injection is every 2 weeks on Wednesdays.   FAMILY HISTORY: Notable for hypertension and brain tumor.   SOCIAL HISTORY: There is no alcohol or tobacco use.   REVIEW OF SYMPTOMS:  CONSTITUTIONAL:  There are no fevers or chills or fatigue or weakness.  HEENT:  There are no visual or hearing changes.  CARDIOVASCULAR: There is no chest pain or palpitations.  RESPIRATORY:  No cough and no shortness of breath.  GASTROINTESTINAL:  Nausea, vomiting and diarrhea, which have all since resolved. The rest of the review of symptoms is negative.   PHYSICAL EXAMINATION: GENERAL: The patient is afebrile. He is in no acute distress. He is overweight, but in no acute  distress.  HEAD AND NECK: Normocephalic, atraumatic head. Pupils are equally reactive. Throat was clear. Neck was supple.   CARDIAC: Regular rhythm and rate without murmurs.  LUNGS: Clear bilaterally.  ABDOMEN: Somewhat mildly distended. Abdomen is obese, but I did not appreciate any significant tenderness today. There are some hyperactive bowel sounds.  EXTREMITIES: Show no clubbing,  cyanosis or edema.   IMAGING AND LABORATORY DATA: Sodium 134 today. Creatinine is 1.86; it was 2.60 yesterday. BUN 43. Magnesium 2.1. Liver enzymes showed total bilirubin of 1.3, AST 113 and ALT 146. White count was 12.7; it is now down to 8.5. Hemoglobin is 13.4. INR is 2.8. Initial CT abdominal series showed some dilated small bowel suspicious for either adynamic ileus or partial small bowel obstruction.   ASSESSMENT AND PLAN: This is a patient with multiple medical history including history of Crohn disease. He has been fairly stable all along with Humira injections every 2 weeks. He comes in with acute bout of nausea, vomiting and diarrhea and abdominal distention with CT x-ray suggesting ileus or possible partial bowel obstruction. I suspect that because the patient is feeling better already, that he may have just had a viral gastroenteritis which is resolving. Other possibility includes Crohn flare, although his condition has been fairly stable all along. Stool studies have been ordered, but have not been sent yet because the diarrhea has already stopped. The patient is well known to Dr. Vira Agar. I will have him see the patient starting tomorrow. We will check his sed rate and CRP in the morning in case the symptoms could be related to Crohn's.   Thank you for the referral.   ADDENDUM:  We will order another set of x-rays today to see whether there is any  improvement of his abdomen.   ____________________________ [name], MD pyo:dmm D: 10/25/2013 10:12:45 ET T: 10/25/2013 11:13:46 ET JOB#: [id number]  cc: [name], MD, <Dictator> [name] MD ELECTRONICALLY SIGNED 11/01/2013 8:11

## 2014-12-03 NOTE — H&P (Signed)
PATIENT NAME:  [name] [name], [name] [name] MR#:  [id number] DATE OF BIRTH:  [birth year]  DATE OF ADMISSION:  10/24/2013  PRIMARY CARE PHYSICIAN: Dr. Rosanna Randy   PRIMARY GI: Dr. Vira Agar   REFERRING PHYSICIAN:  Dr. Corky Downs  CHIEF COMPLAINT: Nausea, vomiting, diarrhea for 3 days.   HISTORY OF PRESENT ILLNESS: The patient is a pleasant, 74 year old male with history of CAD, status post CABG, primary progressive aphasia and early mild dementia, obstructive sleep apnea, Crohn's disease. He also has a history of DVT and PE in the past, and he is on Coumadin. He is here with his wife. Apparently the patient has been having nausea, vomiting and diarrhea since Thursday. No sick contacts, recent antibiotics, or hospitalizations. He does not have any contacts with similar symptoms. He states that he started with nausea and vomiting, and the nausea and vomiting resolved as of yesterday, but the diarrhea is persistent. The diarrhea is watery, initially yellow but now green, multiple times a day, up to 5 to 6 times. He was actually in the shower by himself today, and slipped. He does not claim passing out. Does not endorse  syncope or loss of consciousness. Wife thinks he might have passed out, but he states he remembers what happened. Here, he was noted to have renal failure, with a creatinine of 2.6, BUN of 43, and mild leukocytosis, and hospitalist services were contacted for further evaluation and management.   PAST MEDICAL HISTORY: CAD, status post CABG, recurrent syncope, for which he got a pacemaker last year, obstructive sleep apnea, Factor V Leyden mutation, Crohn's disease, history of DVT and PE, GERD, hypertension, obesity.   SURGICAL HISTORY: Bilateral cataract surgery with lens implants, CABG, cholecystectomy, colon cancer, status post resection, back surgery x 2, pacemaker placement.   ALLERGIES: ASACOL, CODEINE, DILTIAZEM, DOXYCYCLINE, FLAGYL, INFLIXIMAB, MERCAPTOPURINE, MESALAMINE, METHOTREXATE.   OUTPATIENT  MEDICATIONS: Acetaminophen 650 every 6 hours as needed for pain, alprazolam 0.5 mg at bedtime as needed, amiodarone 100 mg and 200 mg every other day alternating between these tablets, amlodipine 5 mg daily, aspirin 81 mg daily, Carvedilol 3.125 mg 2 times a day, diphenhydramine 25 mg 1 cap every 2 weeks, 2 hours before Humira, Dramamine 50 mg every 8 hours as needed for dizziness, fluticasone 2 sprays once a day as needed for allergies, folic acid 1 mg daily, Humira 40 mg per 0.8 mL every 2 weeks on Wednesdays, losartan 100 mg once a day, multivitamin 1 tab daily, Nitrostat p.r.n., omeprazole 40 mg daily, ranitidine 150 mg every 2 weeks on Wednesdays, 2 hours before Humira,  simvastatin 40 mg at bedtime, warfarin 2.5 mg every day except Wednesday and Saturday, and warfarin 5 mg at bedtime, Wednesday and Saturday.   FAMILY HISTORY: Hypertension. Also mom with brain tumor.   SOCIAL HISTORY: Lives with wife. Denies alcohol, smoking or drug use.   REVIEW OF SYSTEMS:  CONSTITUTIONAL: Denies fever, fatigue, weakness.  EYES: No blurry vision or double vision.  ENT: Denies tinnitus or hearing loss.  RESPIRATORY: No cough, wheezing or shortness of breath.  CARDIOVASCULAR: Denies chest pain. Has history of syncope and CAD, status post CABG.  GASTROINTESTINAL: Nausea, vomiting and diarrhea as above. No black stools or bloody stools. No abdominal pain.  GENITOURINARY: Per wife, he has been having decreased urinary output. ENDOCRINE:  Denies polyuria, nocturia.  HEMATOLOGIC AND LYMPHATIC: Denies anemia or easy bruising. He is on Coumadin.  MUSCULOSKELETAL: Denies arthritis or gout.  NEUROLOGIC: Denies focal weakness or numbness. He does have early mild dementia and  primary progressive aphasia.  PSYCHIATRIC: Some anxiety.   PHYSICAL EXAMINATION: VITAL SIGNS: Temperature on arrival 97.9, pulse rate 70, respiratory rate 18, blood pressure 101/63, O2 sat 93% on room air.  GENERAL: The patient is an obese male  lying in bed, no obvious distress.  HEENT: Normocephalic, atraumatic. Pupils are equal and reactive. Oral dry mucous membranes.  NECK: Supple. No thyroid tenderness. No cervical lymphadenopathy.  CARDIOVASCULAR: S1, S2. Regular. No murmurs, rubs or gallops appreciated.  LUNGS: Clear to auscultation, without significant wheezing or rhonchi.  ABDOMEN: Soft. Mildly distended and some tympany. No significant tenderness, hyperactive. No guarding or rebound.  EXTREMITIES: No pitting edema.  NEUROLOGIC: Cranial nerves II through XII grossly intact. Strength is 5/5 all extremities. Sensation is intact to light touch.  PSYCHIATRIC: Awake, alert, oriented x 3. A little slow to respond, but seems to be appropriate.  SKIN: No obvious rashes or lesions.   LABS: INR is 2.4. White count of 12.7, hemoglobin 14.9, platelets 248. Troponin negative. Bilirubin is 1.3, AST 113, ALT 146. These are also elevated before as of August 2014. Glucose 108, BUN 43, creatinine 2.6, sodium 133, potassium 4. EKG:  Normal sinus rhythm, a little bit of T-wave inversions on 1 and aVL. No acute ST elevations or depressions.   ASSESSMENT AND PLAN: We have a 74 year old with coronary artery disease, status post coronary artery bypass grafting, Factor V Leyden mutation, Crohn's disease, deep venous thrombosis and pulmonary embolus on Coumadin, who comes in with nausea, vomiting and  diarrhea for a few days, with new renal failure.   In regards to the nausea, vomiting, diarrhea, the nausea and vomiting is resolved, but the diarrhea is persistent, and given the mild leukocytosis and the greenish stool, I would place the patient in contact, check Clostridium difficile as well as stool cultures. He does have a history of Crohn's, but do not know if this is typical for Crohn's. I would obtain a GI consult, start the patient on IV fluids, follow with the stool cultures. He does have allergy to FLAGYL, and if this is indeed C. diff, we would have  to do vancomycin. However, he has had no sick contacts, no recent antibiotics, no recent hospitalization, no fevers, which makes C. diff less likely but still possible. We would also send in stool ova and parasites as patient is  on Humira.   In regards to the renal failure, it is  secondary to prerenal causes of GI losses and intravascular dehydration. He would be on IV fluids, and if the kidney function does not improve, we would obtain a Nephrology consult. I would hold all  nephrotoxins, including losartan at this time. It would hopefully not cause the patient to be hypotensive at this time and allow higher blood pressure, and hold amlodipine as well as the losartan. I would continue the Coreg at this time.   In regards to his DVT and PE, I would continue the Coumadin. His INR is therapeutic. He does have elevated LFTs, which appear to be also elevated in August of last year. Would check another hepatic function panel in the morning.   The patient is DNR, per his wishes, as well as wife.   Total time spent is 50 minutes.    ____________________________ Vivien Presto, MD sa:mr D: 10/24/2013 15:03:00 ET T: 10/24/2013 17:08:40 ET JOB#: [id number]  cc: Vivien Presto, MD, <Dictator> Manya Silvas, MD Richard L. Rosanna Randy, MD   Vivien Presto MD ELECTRONICALLY SIGNED 11/24/2013 10:12

## 2014-12-03 NOTE — Consult Note (Signed)
Brief Consult Note: Diagnosis: gut failure.   Patient was seen by consultant.   Discussed with Attending MD.   Comments: XRays reviewed and pt's abdomen examined: his N/V have resolved, and he is having diarrhea. His abdominal distention persists but he has little or no abdominal pain. He is moderately-severely distended with tympany, and no tenderness. XRays show gas throughout the cecum, ascending colon, and traansverse colon, and therefore this is not a PSBO. It could be called an adynamic ileus, but since he also has diarrhea, it is more like "gut failure."  No indications for surgery, now, or later in this hospital course. Agree with sips of non-carbonated clear liquids; I would not advance his diet beyond that until his abdominal distention clinically improves.  Electronic Signatures: Consuela Mimes (MD)  (Signed 15-Mar-15 17:15)  Authored: Brief Consult Note   Last Updated: 15-Mar-15 17:15 by Consuela Mimes (MD)

## 2014-12-03 NOTE — Discharge Summary (Signed)
PATIENT NAME:  [name] [name], [name] [name] MR#:  [id number] DATE OF BIRTH:  26-Mar-1941  DATE OF ADMISSION:  10/24/2013 DATE OF DISCHARGE:  10/27/2013  ADMITTING DIAGNOSIS: Nausea, vomiting, diarrhea.   DISCHARGE DIAGNOSES: 1.  Nausea, vomiting, and diarrhea possibly due to gastroenteritis with associated ileus.  2.  Ileus, nonspecific dilation, now improved.  3.  Acute renal failure due to dehydration, gastrointestinal loss, now resolved.  4.  Coronary artery disease.  5.  Factor V Leiden deficiency.  6.  Gastroesophageal reflux disease.   CONSULTANTS: Dr. Candace Cruise, Dr. Burt Knack, Dr. Vira Agar.   PERTINENT LABS AND EVALUATIONS: Admitting glucose 108, BUN 43, creatinine 2.60, sodium 133, potassium 4, chloride 104, CO2 23, calcium 8.4. LFTs showed bilirubin total 1.3, alk phos 95, AST 113, ALT 146. Troponin less than 0.02. WBC 12.7, hemoglobin 14.9, platelet count 248,000. INR 2.4 and 2.8. Today INR is 4.3 on discharge day.   Stool cultures: No pathology.   X-ray: Abdominal three-view of the abdomen showed findings suspicious for partial small bowel obstruction, localized adynamic ileus could look similar.  Repeat chest x-ray on the 17th showed mildly dilated small bowel loops. No colonic distention.   HOSPITAL COURSE: Please refer to H and P done by the admitting physician. The patient is a 74 year old white male with history of coronary artery disease status post CABG, primary progressive aphasia, early mild dementia, history of DVT and PE who is on chronic Coumadin therapy presented with nausea, vomiting, and diarrhea for 3 days in duration. The patient was seen in the ED and was noted to be very dehydrated and was noted to have acute renal failure. He was admitted for supportive care, IV hydration, and work-up for his diarrhea. In terms of his diarrhea, his stool studies remained negative. His diarrhea resolved. The patient also was complaining of abdominal distention. Therefore, an abdominal x-ray was done which  showed small bowel ileus. He was seen by surgery and GI and supportive care again provided. The patient's symptoms significantly improved. There was no further need for any surgical intervention. In terms of his acute renal failure, he was hydrated very well and his renal function normalized. The patient also has a history of factor V Leiden deficiency and is on chronic Coumadin. His INR level is elevated on the day of discharge. I recommended he hold his Coumadin today and then have his INR checked Friday. At this time, he is doing much better and has been cleared by surgery for discharge and so at this time the patient is doing much better and is stable for discharge.   DISCHARGE MEDICATIONS: Omeprazole 40 daily, amiodarone 100 one tab p.o. daily alternating with 200 daily, simvastatin 40 at bedtime, losartan 154 daily, folic acid 1 mg daily, multivitamin 1 tab p.o. daily, Nitrostat 0.4 sublingual p.r.n. chest pain, Tylenol 650 q. 4 p.r.n., Humira 0.8 mL subcu every 2 weeks, alprazolam 0.5 mg at bedtime as needed, amlodipine 5 daily, aspirin 81 one tab p.o. daily, carvedilol 3.125 one tab p.o. b.i.d., Dramamine 50 mg 1 tab p.o. q. 8 p.r.n. for dizziness, diphenhydramine 25 one tab p.o. b.i.d. on Wednesdays 2 hours before Humira injection, fluticasone 2 sprays daily, ranitidine 150 mg 1 tab p.o. b.i.d. on Wednesday 2 hours prior to Humira injection, Coumadin to be held today and then restarted tomorrow as previously scheduled, MiraLax 17 grams daily.   DIET: Low-sodium, low-fat, low-cholesterol.   ACTIVITY: As tolerated.   TIMEFRAME FOR FOLLOW-UP: Follow up with Dr. Vira Agar in 1 to 2 weeks. Follow  with primary MD in 2 to 4 weeks. The patient was told no Coumadin tonight. The patient to have his INR checked this Friday at [facility] as doing previously.  TIME SPENT: 35 minutes.  ____________________________  H. , MD shp:sb D: 10/28/2013 10:15:07 ET T: 10/28/2013 11:00:49  ET JOB#: [id number]  cc:  H. , MD, <Dictator>  H  MD ELECTRONICALLY SIGNED 10/29/2013 8:12 

## 2014-12-03 NOTE — Consult Note (Signed)
Pt seen and examined. Full consult to follow. Pt of Dr. Percell Boston with hx of Crohn's disease on Humira q 2 weeks, who was admitted with acute nausea, vomiting, diarrhea, low abd pain, and increased abdominal distension. X-ray suggests either ileus vs pSBO. Feeling better. N/V/D has all resolved. Abd feels less distended today. Stool studies ordered. Either patient had a bout of gastroenteritis or Crohn's flare. Check ESR/CRP in AM. Repeat abd series for improvement. Will have Dr. Vira Agar follow patient. thanks.  Electronic Signatures: Verdie Shire (MD)  (Signed on 16-Mar-15 07:47)  Authored  Last Updated: 16-Mar-15 07:47 by Verdie Shire (MD)

## 2014-12-04 NOTE — Discharge Summary (Signed)
PATIENT NAME:  [name] [name], [name] [name] MR#:  [id number] DATE OF BIRTH:  11-May-1941  DATE OF ADMISSION:  11/27/2011 DATE OF DISCHARGE:  11/28/2011  CONSULTATION: Cardiology, Dr. [name].   FINAL DISCHARGE DIAGNOSES:  1. Paroxysmal atrial fibrillation. 2. Coronary artery disease.   3. Hyperlipidemia.   CONDITION: Stable.   MEDICATIONS: Please refer to the [facility] discharge instructions.   ADDITIONAL MEDICATIONS:  1. Amiodarone 200 mg p.o. b.i.d.  2. Toprol 37.5 mg p.o. daily.   NOTE: Do not take Lopressor ER 50 mg p.o. daily.   DIET: Low sodium diet.   ACTIVITY: As tolerated.   FOLLOW UP CARE: Follow up with primary care physician in 1 to 2 weeks. Follow up with Dr. Ron Parker, cardiology, within 1 to 2 weeks.   REASON FOR ADMISSION: Palpitations and irregular heart beat.   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 For a detailed history and physical examination, please refer to the admission note dictated by Dr. Inez Catalina.   LABORATORY, DIAGNOSTIC AND RADIOLOGICAL DATA: WBC 10.4, hemoglobin 16.1, platelets 248, creatinine 1.18, BUN 21, sodium 141, potassium 4.1, chloride 105, troponin level less than 0.02, INR 1.9, BNP 136. EKG showed atrial fibrillation, RVR at 140. No ST elevation or depression.   HOSPITAL COURSE: Patient was admitted for new onset atrial fibrillation with RVR but resumed back to normal sinus rhythm after admission. Patient was treated with beta blocker, aspirin and Coumadin. INR was therapeutic during hospitalization. Dr. [name], cardiologist, evaluated patient, recommend amiodarone 200 mg p.o. b.i.d. and change to Toprol 37.5 mg p.o. daily. In addition, continue Coumadin. Follow-up  INR. For hypertension patient has been treated with Lopressor. After admission patient has no symptoms.   PHYSICAL EXAMINATION: VITAL SIGNS: Temperature 97.9, blood pressure 136/80, pulse 52, oxygen saturation 98% on room air. Physical examination was unremarkable.   CONDITION ON DISCHARGE: Patient was clinically stable according to Dr. [name].  DISPOSITION: The patient was discharged to home.   Discussed the discharge plan with patient, patient's family member and the nurse.   TIME SPENT: About 35 minutes.  ____________________________ Demetrios Loll, MD qc:cms D: 12/11/2011 17:37:16 ET T: 12/13/2011 09:33:04 ET JOB#: [id number]  cc: Demetrios Loll, MD, <Dictator> Demetrios Loll MD ELECTRONICALLY SIGNED 12/13/2011 13:47

## 2014-12-04 NOTE — Consult Note (Signed)
General Aspect 74 yo male with history of CAD, status post CABG in 2008, syncope (probably neurally mediated-evaluated by Dr. Caryl Comes in the past), Hypertension, Crohn's disease, prior pulmonary embolism, chronic Coumadin therapy, status post IVC filter, hypercoagulable state, chronic dyspnea-evaluated by Dr. Melvyn Novas in the past.  Last LHC 02/2009: LIMA-LAD patent, SVG-D1 patent, SVG-OM1/OM2 patent, SVG-PL patent, EF 55-60%.  Last echo 10/2010: Mild LVH, EF 02-72%, grade 2 diastolic dysfunction, mild MR, mild LAE, PASP 19.  He was seen in the office 09/18/11 with chest discomfort.  Lexiscan Myoview 09/24/11: EF 63%, no ischemia. He presents with palpitations and tachycardia. Found to be in atrial fib with RVR, rates as high as 150 in the ER.  He was last seen in clinic 11/15/2011 for occasional chest tightness.  He is a vague historian.  Symptoms generally occur at night.  They are brief and lasted just a few minutes.  He did not have any associated shortness of breath.  He did have some left arm symptoms associated with his chest discomfort.   He can not reproduce his symptoms with exertion.  He continues to have exertional dyspnea.   he reports having several other epsiodes of rapid heart rate. He was started on an event monitor over one week ago and called yesterday as asked to increase his toprol to 50 mg daily.    Present Illness . Echo in early 2012: Normal LV size with mild LV hypertrophy. EF 60-65%. Moderate diastolic dysfunction. Normal RV size and systolic function. Mild mitral regurgitation.   Physical Exam:   GEN well developed, well nourished, no acute distress, obese    HEENT red conjunctivae    NECK supple    RESP normal resp effort  clear BS    CARD Regular rate and rhythm  No murmur    ABD denies tenderness  soft    LYMPH negative neck    EXTR negative edema    SKIN normal to palpation    NEURO cranial nerves intact, motor/sensory function intact    PSYCH alert, poor insight    Review of Systems:   Subjective/Chief Complaint SOB, palpitations, tachycardia, dizzy epsiodes    General: feels better    Skin: No Complaints    ENT: No Complaints    Eyes: No Complaints    Neck: No Complaints    Respiratory: Short of breath    Cardiovascular: Palpitations    Gastrointestinal: No Complaints    Genitourinary: No Complaints    Vascular: No Complaints    Musculoskeletal: No Complaints    Neurologic: No Complaints    Hematologic: No Complaints    Endocrine: No Complaints    Psychiatric: No Complaints    Review of Systems: All other systems were reviewed and found to be negative    Medications/Allergies Reviewed Medications/Allergies reviewed     A Fib: 26-Nov-2011   High Factor 8:    Back Pain, Chronic:    Crohn's:    GERD - Esophageal Reflux:    Hypertension:    Crohn's Disease:    Back Surgery:    Cholecystectomy:    Colon Resection:    CABG:    IVF:        Admit Diagnosis:   ATRIAL FIBRILLATION: 27-Nov-2011, Active, ATRIAL FIBRILLATION      Admit Reason:   Atrial fibrillation: (427.31) Active, ICD9, Atrial fibrillation  Home Medications: Medication Instructions Status  predniSONE 40 mg oral tablet (once in AM and once in PM 48 hrs prior to HUMIRA injection)  Active  Zantac 150 oral tablet  Active  Humira Pen 40 mg/0.8 mL subcutaneous kit twice a month  Active  Coumadin 5 mg oral tablet Sun, Mon, Tues, Thurs, Friday 1 tab(s)  once a day Active  simvastatin 20 mg oral tablet  Active  aspirin 81 mg oral tablet, chewable  Active  nitrostat 0.4 mg sl q5 PRN  Active  folic acid 1 mg po daily  Active  VITAMIN B 12 500 mcg po daily  Active  Caltrate 600 with D 600 mg-400 intl units oral tablet 1  orally once a day  Active  multivitamin   once a day  Active  Vitamin D3 1000 intl units oral capsule 1 cap(s) orally once a day Active  Dramamine 50 mg oral tablet 1 tab(s) orally every 6 hours, As Needed Active  Flonase 50  mcg/inh nasal spray 1 spray(s) nasal once a day Active  Claritin 10 mg oral tablet 1 tab(s) orally once a day Active  Cozaar 50 mg oral tablet 1 tab(s) orally once a day Active  metoprolol succinate 50 mg oral tablet, extended release 1 tab(s) orally once a day Active  omeprazole 40 mg oral delayed release capsule 1 cap(s) orally once a day Active     Routine Hem:  16-Apr-13 22:14    WBC (CBC) 10.4   RBC (CBC) 4.99   Hemoglobin (CBC) 16.1   Hematocrit (CBC) 48.3   Platelet Count (CBC) 248   MCV 97   MCH 32.2   MCHC 33.2   RDW 14.5  Routine Chem:  16-Apr-13 22:14    Glucose, Serum 117   BUN 21   Creatinine (comp) 1.18   Sodium, Serum 141   Potassium, Serum 4.1   Chloride, Serum 105   CO2, Serum 27   Calcium (Total), Serum 8.9   Anion Gap 9   Osmolality (calc) 285   eGFR (African American) >60   eGFR (Non-African American) >60  Cardiac:  16-Apr-13 22:14    Troponin I < 0.02  Routine Coag:  16-Apr-13 22:14    Prothrombin 21.7   INR 1.9  Routine Chem:  16-Apr-13 22:14    B-Type Natriuretic Peptide [facility]) 136  Cardiac:  16-Apr-13 22:14    CK, Total 96   CPK-MB, Serum 1.9  17-Apr-13 05:56    Troponin I < 0.02   CK, Total 78   CPK-MB, Serum 1.7   EKG:   Interpretation EKG shows atrial fib with rate 140 bpm, nonspecific ST ABN    Mesalamine: Hives  Methotrexate: Hives, Rash  Remicade: Rash, Other  Purinethol: Hives  Asacol: Headaches  Codeine: GI Distress  Flagyl: Unknown  Pyridostigmine: Unknown  Diltiazem: Rash, Itching  Mercaptopurine: Hives  amiodarone: Other  Vital Signs/Nurse's Notes: **Vital Signs.:   17-Apr-13 08:29   Temperature Temperature (F) 97.9   Celsius 36.6   Temperature Source oral   Pulse Pulse 54   Respirations Respirations 16   Systolic BP Systolic BP 030   Diastolic BP (mmHg) Diastolic BP (mmHg) 74   Mean BP 93   Pulse Ox % Pulse Ox % 95   Pulse Ox Activity Level  At rest   Oxygen Delivery Room Air/ 21 %     Impression 74  yo male with history of CAD, status post CABG in 2008, syncope (probably neurally mediated-evaluated by Dr. Caryl Comes in the past), Hypertension, Crohn's disease, prior pulmonary embolism, chronic Coumadin therapy, status post IVC filter, hypercoagulable state, chronic dyspnea- Lexiscan Myoview 09/24/11: EF 63%, presenting  with palpitations and tachycardia. Found to be in atrial fib with RVR, rates as high as 150 in the ER.  A/P: 1) Atrial fibrillation Converted back to NSR with medical management in ER. By his hx, he has been having paroxysmal atrial fib, sometimes symptomatic though not always.- --Would start amiodarone 200 mg po BID (will given 400 mg po tab to start) Continue at d/c He can continue to wear his monitor as an outpt and we can monitor his rhythm Would continue toprol 50 mg daily Already on warfarin  2) CAD, CABG no significant chest pain sx cardiac enz neg Recent negative stress test  3) HTN; Would continue outpt meds  4) Syncope Suspect mixed picture over the past few years Neurally medicated. Suspect atrial fib may be a more recent entity explaining dizziness   Electronic Signatures: Ida Rogue (MD)  (Signed 17-Apr-13 09:36)  Authored: General Aspect/Present Illness, History and Physical Exam, Review of System, Past Medical History, Health Issues, Home Medications, Labs, EKG , Allergies, Vital Signs/Nurse's Notes, Impression/Plan   Last Updated: 17-Apr-13 09:36 by Ida Rogue (MD)

## 2014-12-04 NOTE — H&P (Signed)
PATIENT NAME:  [name] [name], [name] [name] MR#:  [id number] DATE OF BIRTH:  [birth year]  DATE OF ADMISSION:  11/27/2011  REFERRING PHYSICIAN: Dr. Reita Cliche   PRIMARY PHYSICIAN: Miguel Aschoff, MD   CARDIOLOGIST: Dola Argyle, MD   PRESENTING COMPLAINT: Palpitations and irregular heartbeat.   HISTORY OF PRESENT ILLNESS: 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 According to patient and wife, he has been dealing with sensations of irregular heart rhythm for the past one month now. He has been having some shortness of breath. Reports that actually approximately one week ago he had a syncopal spell but was not evaluated. The patient does have a prior history of syncope. Wife reports that Monday night while he was in a meeting was notified by Zacarias Pontes that the patient was having irregular rhythm and was asked to check pulse and blood pressure. By the time he was able to get home, it was noted that his blood pressure and pulse had normalized. The next morning Dr. Kae Heller nurse had called and instructed the patient to increase his metoprolol. Last night the patient increased his metoprolol from 25 to 50, however, after taking the increased dose that is when he felt his heart beating fast and irregular and felt weak as well as became ashen in color according to his wife. He does have the associated shortness of breath. He denies any chest pain, orthopnea, or PND. No worsening lower extremity swelling. Reports that it appears he cannot feel his heart beat irregular all the time.   PAST MEDICAL HISTORY:  1. Crohn's disease.  2. 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   3. Coronary artery disease, status post coronary artery bypass graft in 2008.  4. Hyperlipidemia.  5. Gastroesophageal reflux disease.  6. Hypertension.  7. History of syncope in the past, last admitted May of 2010 for workup.  PAST SURGICAL HISTORY:  1. Bilateral cataract surgery with lens implant.  2. Coronary artery bypass graft.  3. Cholecystectomy.  4. Benign colon tumor status post resection.  5. Back surgery x2.   MEDICATIONS:  1. Aspirin 81 mg daily.  2. Caltrate 600 with D 400 international units daily.  3. Claritin 10 mg two hours before Humira and Zantac before Humira as well as prednisone 48 hours before Humira.  4. Coumadin 5 mg on Sunday, Monday, Tuesday, Thursday, Friday; 2.5 mg on Wednesday and Saturday.  5. Cozaar 50 mg daily.  6. Dramamine 50 mg every six hours as needed.  7. Flonase 50 mcg inhaler one spray each naris daily.  8. Folic acid 1 mg daily.  9. Humira pen 40 mg/0.8 mL subcutaneous twice monthly.  10. Metoprolol succinate 50 mg daily.  11. Multivitamin daily.  12. Nitrostat sublingual as needed 0.4 mg.  13. Omeprazole 40 mg q.a.m.   ALLERGIES: Amiodarone, Asacol, codeine, diltiazem, Flagyl, mesalamine, methotrexate, Purinethol, pyridostigmine, Remicade.   FAMILY HISTORY: Family history of hypertension. Mother died of brain tumor.   SOCIAL HISTORY: Lives in [place] with his wife. He smoked as a teenager but no longer smoking. No alcohol or drug use.  REVIEW OF SYSTEMS: CONSTITUTIONAL: No fevers, chills. EYES: No glaucoma. He has cataracts. ENT: No epistaxis or  discharge. RESPIRATORY: No cough, hemoptysis. CARDIOVASCULAR: As per history of present illness. GI: No nausea, vomiting, abdominal pain, hematemesis. GU: No dysuria or hematuria. ENDOCRINE: No polyuria or dyspnea. HEME: No easy bleeding. SKIN: No ulcers. MUSCULOSKELETAL: He has occasional back pain and sciatica. NEUROLOGIC: No history of stroke or seizures. PSYCH: Denies any depression or  suicidal ideation.   PHYSICAL EXAMINATION:   VITAL SIGNS: Temperature 97.9, pulse 136 on initial arrival, current pulse is in the 70's in normal sinus, respiratory rate 24, blood pressure 170/86, repeat pressure 112/67, sating at 100% on room air.   GENERAL: Lying in bed in no apparent distress.   HEENT: Normocephalic, atraumatic. Pupils are equal, symmetric, nonicteric. Nares without discharge. Moist mucous membrane.   NECK: Soft and supple. No adenopathy or JVP.   CARDIOVASCULAR: Non-tachy. No murmurs, rubs, or gallops.   LUNGS: Faint basilar crackles. No use of accessory muscles or increased respiratory effort.   ABDOMEN: Soft. Positive bowel sounds. No mass appreciated.   EXTREMITIES: Trace edema bilaterally. Dorsal pedis pulses intact.   MUSCULOSKELETAL: No joint effusion.   SKIN: No ulcers.   NEUROLOGIC: No dysarthria or aphasia. Symmetrical strength. No focal deficits.   PSYCH: He is alert and oriented. The patient is cooperative.   PERTINENT LABS AND STUDIES: WBC 10.4, hemoglobin 16.1, hematocrit 48.3, platelets 248, MCV 97, glucose 117, BUN 21, creatinine 1.18, sodium 141, potassium 4.1, chloride 105, carbon dioxide 27, calcium 8.9. Troponin less than 0.02. INR 1.9. BNP 136.   EKG initially with rate of 140, atrial fibrillation, RVR. No ST elevation or depression. He has complete right bundle in lead V1.   Chest x-ray without any acute findings.   ASSESSMENT AND PLAN: [name] [name] is a 74 year old gentleman with history of coronary artery disease status post five-vessel bypass, Crohn's disease, DVT and PE, hyperlipidemia, and hypertension presenting with complaints of palpitations and weakness.  1. New onset atrial fibrillation, RVR now in normal sinus rhythm and rate controlled status post small dose of metoprolol 5 mg IV. Will further increase his metoprolol but will hold his losartan while making adjustments on his beta-blocker. Already on aspirin and Coumadin. Continue to  follow PT/INR. Continue on tele. Cycle cardiac enzymes. Send TSH, mag, and urinalysis. Will get echocardiogram. Will obtain Cardiology consultation for further diagnostic tests if required.  2. Acquired coagulopathy, presenting INR of 1.9. Resume home Coumadin regimen for now.  3. Hypertension. Adjusting metoprolol dose as above and holding losartan.  4. Crohn's disease. The patient is on Humira.  5. Prophylaxis. On aspirin, Coumadin, aspirin, and omeprazole.   TIME SPENT: Approximately 50 minutes spent on patient care.   ____________________________ Rita Ohara, MD ap:drc D: 11/27/2011 02:49:48 ET T: 11/27/2011 08:00:57 ET JOB#: [id number]  cc: Ajee Heasley, MD, <Dictator> Richard L. Rosanna Randy, MD Rita Ohara MD ELECTRONICALLY SIGNED 12/09/2011 0:14

## 2014-12-05 ENCOUNTER — Ambulatory Visit (INDEPENDENT_AMBULATORY_CARE_PROVIDER_SITE_OTHER): Payer: Medicare Other | Admitting: Cardiovascular Disease

## 2014-12-05 ENCOUNTER — Encounter: Payer: Self-pay | Admitting: Cardiovascular Disease

## 2014-12-05 VITALS — BP 120/70 | HR 75 | Ht 70.0 in | Wt 215.0 lb

## 2014-12-05 DIAGNOSIS — I2510 Atherosclerotic heart disease of native coronary artery without angina pectoris: Secondary | ICD-10-CM

## 2014-12-05 DIAGNOSIS — R0602 Shortness of breath: Secondary | ICD-10-CM

## 2014-12-05 DIAGNOSIS — I4891 Unspecified atrial fibrillation: Secondary | ICD-10-CM | POA: Diagnosis not present

## 2014-12-05 DIAGNOSIS — I951 Orthostatic hypotension: Secondary | ICD-10-CM

## 2014-12-05 DIAGNOSIS — R55 Syncope and collapse: Secondary | ICD-10-CM | POA: Diagnosis not present

## 2014-12-05 DIAGNOSIS — I10 Essential (primary) hypertension: Secondary | ICD-10-CM | POA: Diagnosis not present

## 2014-12-05 DIAGNOSIS — E785 Hyperlipidemia, unspecified: Secondary | ICD-10-CM

## 2014-12-05 NOTE — Assessment & Plan Note (Signed)
Currently with no symptoms of angina. No further workup at this time. Continue current medication regimen. 

## 2014-12-05 NOTE — Assessment & Plan Note (Addendum)
We have recommended that he stop his amlodipine and monitor his blood pressure

## 2014-12-05 NOTE — Assessment & Plan Note (Signed)
Chronic shortness of breath, very deconditioned at baseline. Recommended a regular walking program.

## 2014-12-05 NOTE — Assessment & Plan Note (Signed)
Recurrent near-syncope and syncope episodes, last episode February 2016. We will need to run his blood pressure higher. We'll hold the amlodipine

## 2014-12-05 NOTE — Progress Notes (Signed)
Patient ID: [name] [name], male    DOB: [birth year], 74 y.o.   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 workup August 2014 for continued episodes of syncope found to have sick sinus syndrome, loop monitor placed with continued episodes of syncope, pacemaker placed at [facility] August 2014. H/o  atrial lead dislodgement and was also found to have reduce R waves. He has undergone PPM lead revisions.   admission to the hospital 10/24/2013 with food poisoning and an ileus, renal failure. He presents today for follow-up of his coronary artery disease  Wife reports that he fell in February 2016 my had lightheadedness. Blood pressure at that time 98 systolic Blood pressure tends to run on the low side. Periodic orthostasis. He is not exercising in the past week or 2, she reports that he has been different, significant slowing.  EKG on today's visit shows normal sinus rhythm with rate 75 bpm, no significant ST or T-wave changes  Other past medical history History of confusion on prior hospital visits . shuffled gait, problems with speech, tremor. He has seen neurology. MRI showed atrophy Carotid ultrasound June 2013 showing mild carotid arterial disease  Total cholesterol 183, LDL 109, HDL 61 in January 2015  cardiac catheterization 03/23/2013 showing patent grafts x5, LIMA to the LAD, vein graft to the diagonal, vein graft to the RCA, vein graft to the OM1 jump graft to the OM 2, ejection fraction 50%  Previous H/o atrial fibrillation on amiodarone, h/o  neurocardiogenic syncope,  MRI that showed no acute abnormality. some bradycardia.  on amiodarone, weaned off metoprolol Previous episodes of syncope in the  past felt to be neurocardiogenic.  episode of syncope  at a restaurant  waiting in line for food. He had nausea, lightheadedness, then had syncope. He is on the ground for several minutes before he came to. Workup in the hospital showed sodium of 122, normal echocardiogram, normal telemetry with normal sinus rhythm. It was felt that he could be orthostatic from the start HCTZ earlier that week with a change in his sodium.    Myoview was performed that showed no ischemia. 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 Codeine Nausea Only  . Diltiazem Hcl Itching and Rash  . Doxycycline Itching and Rash  . Infliximab Itching and Rash  . Mercaptopurine Itching and Rash  . Mesalamine Itching and Rash  . Methotrexate Itching and Rash  . Metronidazole Itching and Rash  . Pyridostigmine Bromide Nausea Only    Outpatient Encounter Prescriptions as of 12/05/2014  Medication Sig  . acetaminophen (TYLENOL) 325 MG tablet Take 650 mg by mouth every 6 (six) hours as needed for pain.  Marland Kitchen adalimumab (HUMIRA PEN) 40 MG/0.8ML injection Inject 40 mg into the skin every 14 (fourteen) days. Tuesday  . ALPRAZolam (XANAX) 0.5 MG tablet Take 0.5 mg by mouth at bedtime as needed for sleep.  Marland Kitchen amiodarone (PACERONE) 200 MG tablet Take 0.5-1 tablets (100-200 mg total) by mouth daily. 200 mg daily alternating with 100 mg  .  aspirin 81 MG tablet Take 81 mg by mouth daily.    Marland Kitchen BREO ELLIPTA 100-25 MCG/INH AEPB daily.   . carvedilol (COREG) 3.125 MG tablet TAKE 1 TABLET (3.125 MG TOTAL) BY MOUTH 2 (TWO) TIMES DAILY.  Marland Kitchen dimenhyDRINATE (DRAMAMINE) 50 MG tablet Take 50 mg by mouth every 8 (eight) hours as needed (for dizziness).   . diphenhydrAMINE (BENADRYL) 25 mg capsule Take 25  mg by mouth See admin instructions. Take 25 mg 2 hours prior to Humira  . ezetimibe (ZETIA) 10 MG tablet Take 1 tablet (10 mg total) by mouth daily.  . fluticasone (FLONASE) 50 MCG/ACT nasal spray Place 2 sprays into the nose daily as needed for allergies.   . folic acid (FOLVITE) 1 MG tablet Take 1 tablet by mouth daily.   Marland Kitchen levothyroxine (SYNTHROID, LEVOTHROID) 50 MCG tablet Take 50 mcg by mouth daily before breakfast.  . losartan (COZAAR) 100 MG tablet TAKE 1 TABLET BY MOUTH EVERY DAY  . Multiple Vitamin (MULTIVITAMIN) tablet Take 1 tablet by mouth daily.    . nitroGLYCERIN (NITROSTAT) 0.4 MG SL tablet Take 1 tablet (0.4 mg total) by mouth as directed.  Marland Kitchen omeprazole (PRILOSEC) 40 MG capsule TAKE 1 CAPSULE BY MOUTH DAILY.  Marland Kitchen polyethylene glycol powder (GLYCOLAX/MIRALAX) powder daily.  . predniSONE (DELTASONE) 20 MG tablet Take as directed.  Marland Kitchen PROAIR HFA 108 (90 BASE) MCG/ACT inhaler Inhale 2 puffs into the lungs as needed.   . ranitidine (ZANTAC) 150 MG tablet Take 150 mg by mouth See admin instructions. Take 2 hrs before Humira  . warfarin (COUMADIN) 5 MG tablet Take 5 mg by mouth daily. 5 mg Wednesdays & Saturdays and 2.5 mg all other days.  Marland Kitchen warfarin (COUMADIN) 5 MG tablet TAKE 1 TABLET DAILY BY MOUTH AS DIRECTED.  . [DISCONTINUED] amLODipine (NORVASC) 5 MG tablet Take 1 tablet (5 mg total) by mouth daily.    Past Medical History  Diagnosis Date  . Syncope and collapse     Evaluation by Dr Caryl Comes; Syncope probably neutrally mediated and modest resting sinus bradycardia. and 16 B. episode of SVT that was either A fib or another type of SVT. Patient improved with combination of holding ACE inhibitor or low BP and reducing beta blocker for bradycardia  . Hypertension                                                                                                                                                                                                                                                                                                                                                                                                                        .  Coronary artery disease     a. CABG 2008. b. Relook cath 08/12/08 - all 5 grafts are patent  . Crohn's disease     Humara Rx in past  . SVT (supraventricular tachycardia)   . 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 History of benign colon tumor   . Mitral regurgitation     mild - echo 4/09  . Anxiety   . Gait difficulty     with Crestor (but tolerates simva)  . Pulmonary embolism     2007,b CT scan, IVC fiter placed then because of concern about coumadin use at that time.  . Orthostasis     treated  . Drug therapy     Intermittant steroids   . Warfarin anticoagulation   . Factor VIII     Factor VIII EXCESS.Marland KitchenMarland KitchenDr Gwenlyn Fudge.Marland Kitchenanother reason for coumadin  . Incomplete RBBB   . Drug therapy     question rash diltiazem, and amio  . Ejection fraction     EF 60-65%, echo, March, 2012, moderate diastolic dysfunction  . 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 Melvyn Novas; has chronic shortness of breath  . PAF (paroxysmal atrial fibrillation)     April, 2013, new diagnosis  . Bradycardia     a. s/p Biotronic pacemaker placement 04/02/13. b. Lead removal/new lead insertion 07/2013 due to dysfunction.  . Carotid artery disease     Dopplers to be checked, June, 2013  . Cough     October, 2013  . Tremor     October, 2013  . High cholesterol   . OSA (obstructive sleep apnea)     "quit wearing his mask" (07/30/2013)  . GERD (gastroesophageal reflux disease)     Past Surgical History  Procedure Laterality Date  . Cholecystectomy    . Back surgery    . Cataract extraction w/ intraocular lens  implant, bilateral Bilateral 1990's  . Colonoscopy w/ polypectomy    . Loop monitor  03/2013    "removed w/in 2 days" (07/30/2013)  . Lead revision  07/30/2013    DDD PM leads removed and a new set of PPM leads inserted via the left  subclavian vein. [id number]  . Tonsillectomy    . Insert / replace / remove pacemaker  03/2013    [facility]  . Cardiac catheterization  03/2013    [facility]  . Cardiac catheterization  2008; ~ 2011  . Coronary artery bypass graft  4/08    CABG X5  . Lumbar disc surgery  1978; 1986  . Colon surgery      "had a tumor removed" (07/30/2013)  . Vena cava filter placement  2007    PE  . Loop recorder implant N/A 03/24/2013    Procedure: LOOP RECORDER IMPLANT;  Surgeon: Deboraha Sprang, MD;  Location: [facility] CATH LAB;  Service: Cardiovascular;  Laterality: N/A;  . Permanent pacemaker insertion N/A 04/02/2013    Procedure: PERMANENT PACEMAKER INSERTION;  Surgeon: Evans Lance, MD;  Location: [facility] CATH LAB;  Service: Cardiovascular;  Laterality: N/A;  . Lead revision N/A 07/30/2013    Procedure: LEAD REVISION;  Surgeon: Evans Lance, MD;  Location: [facility] CATH LAB;  Service: Cardiovascular;  Laterality: N/A;    Social History  reports that he has quit smoking. His smoking use included Cigarettes. He has a 1.25 pack-year smoking history. He has never used smokeless tobacco. He reports that he does not drink alcohol or use illicit drugs.  Family History Family history  is unknown by patient.   Review of Systems  Constitutional: Negative.   Eyes: Eye itching: this.  Respiratory: Negative.   Cardiovascular: Negative.   Gastrointestinal: Negative.   Skin: Negative.   Neurological: Negative.   Hematological: Negative.   Psychiatric/Behavioral: Negative.        Decreased memory, delay in his speech.  All other systems reviewed and are negative.   BP 120/70 mmHg  Pulse 75  Ht 5\' 10"  (1.778 m)  Wt 215 lb (97.523 kg)  BMI 30.85 kg/m2  Physical Exam  Constitutional: He is oriented to person, place, and time. He appears well-developed and well-nourished.  Obese  HENT:  Head: Normocephalic.  Nose: Nose normal.  Mouth/Throat: Oropharynx is clear and moist.  Eyes: Conjunctivae are normal. Pupils are equal,  round, and reactive to light.  Neck: Normal range of motion. Neck supple. No JVD present.  Cardiovascular: Normal rate, regular rhythm, S1 normal, S2 normal, normal heart sounds and intact distal pulses.  Exam reveals no gallop and no friction rub.   No murmur heard. Pulmonary/Chest: Effort normal and breath sounds normal. No respiratory distress. He has no wheezes. He has no rales. He exhibits no tenderness.  Abdominal: Soft. Bowel sounds are normal. He exhibits no distension. There is no tenderness.  Musculoskeletal: Normal range of motion. He exhibits no edema or tenderness.  Lymphadenopathy:    He has no cervical adenopathy.  Neurological: He is alert and oriented to person, place, and time. Coordination normal.  Skin: Skin is warm and dry. No rash noted. No erythema.  Psychiatric: Judgment and thought content normal. His affect is blunt. He is slowed.  Flat affect    Assessment and Plan   Nursing note and vitals reviewed.

## 2014-12-05 NOTE — Patient Instructions (Addendum)
You are doing well.  Please hold the amlodipine  If blood pressure continue to run low, Call the office  Please call us if you have new issues that need to be addressed before your next appt.  Your physician wants you to follow-up in: 6 months.  You will receive a reminder letter in the mail two months in advance. If you don't receive a letter, please call our office to schedule the follow-up appointment.

## 2014-12-05 NOTE — Assessment & Plan Note (Signed)
Unable to tolerate a statin We will continue zetia

## 2014-12-05 NOTE — Assessment & Plan Note (Signed)
Recent episode of near syncope or syncope. Likely orthostasis. We'll hold the amlodipine

## 2014-12-14 ENCOUNTER — Ambulatory Visit (INDEPENDENT_AMBULATORY_CARE_PROVIDER_SITE_OTHER): Payer: Medicare Other

## 2014-12-14 DIAGNOSIS — Z7901 Long term (current) use of anticoagulants: Secondary | ICD-10-CM

## 2014-12-14 DIAGNOSIS — D66 Hereditary factor VIII deficiency: Secondary | ICD-10-CM

## 2014-12-14 DIAGNOSIS — Z5181 Encounter for therapeutic drug level monitoring: Secondary | ICD-10-CM | POA: Diagnosis not present

## 2014-12-14 DIAGNOSIS — I2699 Other pulmonary embolism without acute cor pulmonale: Secondary | ICD-10-CM

## 2014-12-14 LAB — POCT INR: INR: 2.4

## 2014-12-24 ENCOUNTER — Other Ambulatory Visit: Payer: Self-pay | Admitting: Internal Medicine

## 2015-01-12 ENCOUNTER — Encounter: Payer: Self-pay | Admitting: *Deleted

## 2015-01-13 ENCOUNTER — Ambulatory Visit: Payer: Medicare Other | Admitting: Anesthesiology

## 2015-01-13 ENCOUNTER — Encounter
Admission: RE | Disposition: A | Discharge status: Home / Self Care | Payer: Self-pay | Source: Ambulatory Visit | Attending: Unknown Physician Specialty

## 2015-01-13 ENCOUNTER — Encounter: Payer: Self-pay | Admitting: Anesthesiology

## 2015-01-13 ENCOUNTER — Ambulatory Visit
Admission: RE | Admit: 2015-01-13 | Discharge: 2015-01-13 | Disposition: A | Discharge status: Home / Self Care | Payer: Medicare Other | Source: Ambulatory Visit | Attending: Unknown Physician Specialty | Admitting: Unknown Physician Specialty

## 2015-01-13 DIAGNOSIS — K5090 Crohn's disease, unspecified, without complications: Secondary | ICD-10-CM | POA: Diagnosis present

## 2015-01-13 DIAGNOSIS — D123 Benign neoplasm of transverse colon: Secondary | ICD-10-CM | POA: Diagnosis not present

## 2015-01-13 DIAGNOSIS — K648 Other hemorrhoids: Secondary | ICD-10-CM | POA: Insufficient documentation

## 2015-01-13 DIAGNOSIS — Z7982 Long term (current) use of aspirin: Secondary | ICD-10-CM | POA: Insufficient documentation

## 2015-01-13 DIAGNOSIS — Z87891 Personal history of nicotine dependence: Secondary | ICD-10-CM | POA: Insufficient documentation

## 2015-01-13 DIAGNOSIS — R55 Syncope and collapse: Secondary | ICD-10-CM | POA: Insufficient documentation

## 2015-01-13 DIAGNOSIS — Z86711 Personal history of pulmonary embolism: Secondary | ICD-10-CM | POA: Diagnosis not present

## 2015-01-13 DIAGNOSIS — K219 Gastro-esophageal reflux disease without esophagitis: Secondary | ICD-10-CM | POA: Insufficient documentation

## 2015-01-13 DIAGNOSIS — I471 Supraventricular tachycardia: Secondary | ICD-10-CM | POA: Diagnosis not present

## 2015-01-13 DIAGNOSIS — G4733 Obstructive sleep apnea (adult) (pediatric): Secondary | ICD-10-CM | POA: Insufficient documentation

## 2015-01-13 DIAGNOSIS — E780 Pure hypercholesterolemia: Secondary | ICD-10-CM | POA: Diagnosis not present

## 2015-01-13 DIAGNOSIS — I480 Paroxysmal atrial fibrillation: Secondary | ICD-10-CM | POA: Diagnosis not present

## 2015-01-13 DIAGNOSIS — I10 Essential (primary) hypertension: Secondary | ICD-10-CM | POA: Insufficient documentation

## 2015-01-13 DIAGNOSIS — Z9109 Other allergy status, other than to drugs and biological substances: Secondary | ICD-10-CM | POA: Insufficient documentation

## 2015-01-13 DIAGNOSIS — F419 Anxiety disorder, unspecified: Secondary | ICD-10-CM | POA: Diagnosis not present

## 2015-01-13 DIAGNOSIS — Z7901 Long term (current) use of anticoagulants: Secondary | ICD-10-CM | POA: Insufficient documentation

## 2015-01-13 DIAGNOSIS — Z79899 Other long term (current) drug therapy: Secondary | ICD-10-CM | POA: Insufficient documentation

## 2015-01-13 DIAGNOSIS — I2510 Atherosclerotic heart disease of native coronary artery without angina pectoris: Secondary | ICD-10-CM | POA: Insufficient documentation

## 2015-01-13 HISTORY — PX: COLONOSCOPY: SHX5424

## 2015-01-13 SURGERY — COLONOSCOPY
Anesthesia: General

## 2015-01-13 MED ORDER — SODIUM CHLORIDE 0.9 % IV SOLN
INTRAVENOUS | Status: DC
  Administered 2015-01-13: 1000 mL via INTRAVENOUS

## 2015-01-13 MED ORDER — PROPOFOL INFUSION 10 MG/ML OPTIME
INTRAVENOUS | Status: DC | PRN
  Administered 2015-01-13: 125 ug/kg/min via INTRAVENOUS

## 2015-01-13 MED ORDER — MIDAZOLAM HCL 2 MG/2ML IJ SOLN
INTRAMUSCULAR | Status: DC | PRN
  Administered 2015-01-13: 1 mg via INTRAVENOUS

## 2015-01-13 MED ORDER — LACTATED RINGERS IV SOLN
INTRAVENOUS | Status: DC | PRN
  Administered 2015-01-13: 12:00:00 via INTRAVENOUS

## 2015-01-13 MED ORDER — FENTANYL CITRATE (PF) 100 MCG/2ML IJ SOLN
INTRAMUSCULAR | Status: DC | PRN
  Administered 2015-01-13: 50 ug via INTRAVENOUS

## 2015-01-13 NOTE — Anesthesia Preprocedure Evaluation (Signed)
Anesthesia Evaluation   Patient awake    History of Anesthesia Complications Negative for: history of anesthetic complications  Airway Mallampati: III       Dental  (+) Teeth Intact   Pulmonary shortness of breath, with exertion and lying, sleep apnea , former smoker,    + decreased breath sounds      Cardiovascular hypertension, + CAD and + Peripheral Vascular Disease + dysrhythmias + pacemaker Rhythm:Regular     Neuro/Psych Anxiety Depression  Neuromuscular disease    GI/Hepatic GERD-  ,  Endo/Other  Hypothyroidism   Renal/GU negative Renal ROS  negative genitourinary   Musculoskeletal   Abdominal   Peds negative pediatric ROS (+)  Hematology  (+) anemia ,   Anesthesia Other Findings   Reproductive/Obstetrics negative OB ROS                             Anesthesia Physical Anesthesia Plan  ASA: IV  Anesthesia Plan: General   Post-op Pain Management:    Induction: Intravenous  Airway Management Planned: Nasal Cannula  Additional Equipment:   Intra-op Plan:   Post-operative Plan:   Informed Consent: I have reviewed the patients History and Physical, chart, labs and discussed the procedure including the risks, benefits and alternatives for the proposed anesthesia with the patient or authorized representative who has indicated his/her understanding and acceptance.     Plan Discussed with: CRNA  Anesthesia Plan Comments:         Anesthesia Quick Evaluation

## 2015-01-13 NOTE — Transfer of Care (Signed)
Immediate Anesthesia Transfer of Care Note  Patient: [name] [name]  Procedure(s) Performed: Procedure(s): COLONOSCOPY (N/A)  Patient Location: PACU  Anesthesia Type:General  Level of Consciousness: awake, alert  and oriented  Airway & Oxygen Therapy: Patient Spontanous Breathing  Post-op Assessment: Post -op Vital signs reviewed and stable  Post vital signs: Reviewed and stable  Last Vitals:  Filed Vitals:   01/13/15 1037  BP: 158/77  Pulse: 67  Temp: 36.8 C  Resp: 16    Complications: No apparent anesthesia complications

## 2015-01-13 NOTE — H&P (Signed)
Primary Care Physician:  [name], MD Primary Gastroenterologist:  Dr. Vira Agar  Pre-Procedure History & Physical: HPI:  [name] [name] is a 74 y.o. male is here for an colonoscopy. This is for follow up of Crohn's disease.  He has a pacemaker in place.  Lovenox bridge used due to holding coumadin.   Past Medical History  Diagnosis Date  . Syncope and collapse     Evaluation by Dr Caryl Comes; Syncope probably neutrally mediated and modest resting sinus bradycardia. and 16 B. episode of SVT that was either A fib or another type of SVT. Patient improved with combination of holding ACE inhibitor or low BP and reducing beta blocker for bradycardia  . Hypertension                                                                                                                                                                                                                                                                                                                                                                                                                       . Coronary artery disease     a. CABG 2008. b. Relook cath 08/12/08 - all 5 grafts are patent  . Crohn's disease     Humara Rx in past  . SVT (supraventricular tachycardia)   . 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 History of benign colon tumor   . Mitral regurgitation     mild - echo 4/09  .  Anxiety   . Gait difficulty     with Crestor (but tolerates simva)  . Pulmonary embolism     2007,b CT scan, IVC fiter placed then because of concern about coumadin use at that time.  . Orthostasis     treated  . Drug therapy     Intermittant steroids   . Warfarin anticoagulation   . Factor VIII     Factor VIII EXCESS.Marland KitchenMarland KitchenDr Gwenlyn Fudge.Marland Kitchenanother reason for coumadin  . Incomplete RBBB   . Drug therapy     question rash diltiazem, and amio  . Ejection fraction     EF 60-65%, echo, March, 2012, moderate diastolic dysfunction  . Elevated  diaphragm     seen by Dr. Melvyn Novas; has chronic shortness of breath  . PAF (paroxysmal atrial fibrillation)     April, 2013, new diagnosis  . Bradycardia     a. s/p Biotronic pacemaker placement 04/02/13. b. Lead removal/new lead insertion 07/2013 due to dysfunction.  . Carotid artery disease     Dopplers to be checked, June, 2013  . Cough     October, 2013  . Tremor     October, 2013  . High cholesterol   . OSA (obstructive sleep apnea)     "quit wearing his mask" (07/30/2013)  . GERD (gastroesophageal reflux disease)     Past Surgical History  Procedure Laterality Date  . Cholecystectomy    . Back surgery    . Cataract extraction w/ intraocular lens  implant, bilateral Bilateral 1990's  . Colonoscopy w/ polypectomy    . Loop monitor  03/2013    "removed w/in 2 days" (07/30/2013)  . Lead revision  07/30/2013    DDD PM leads removed and a new set of PPM leads inserted via the left subclavian vein. [id number]  . Tonsillectomy    . Insert / replace / remove pacemaker  [facility]/2013    [facility]  . [facility]  03[facility]2013    [facility]  . Cardiac catheterization  2008; ~ 2011  . Coronary artery bypass graft  4/08    CABG X5  . Lumbar disc surgery  1978; 1986  . Colon surgery      "had a tumor removed" (07/30/2013)  . Vena cava filter placement  2007    PE  . Loop recorder implant N/A 03/24/2013    Procedure: LOOP RECORDER IMPLANT;  Surgeon: Deboraha Sprang, MD;  Location: [facility] CATH LAB;  Service: Cardiovascular;  Laterality: N/A;  . Permanent pacemaker insertion N/A 04/02/2013    Procedure: PERMANENT PACEMAKER INSERTION;  Surgeon: Evans Lance, MD;  Location: [facility] CATH LAB;  Service: Cardiovascular;  Laterality: N/A;  . Lead revision N/A 07/30/2013    Procedure: LEAD REVISION;  Surgeon: Evans Lance, MD;  Location: [facility] CATH LAB;  Service: Cardiovascular;  Laterality: N/A;    Prior to Admission medications   Medication Sig Start Date End Date Taking? Authorizing Provider  acetaminophen  (TYLENOL) 325 MG tablet Take 650 mg by mouth every 6 (six) hours as needed for pain.   Yes Historical Provider, MD  adalimumab (HUMIRA PEN) 40 MG/0.8ML injection Inject 40 mg into the skin every 21 ( twenty-one) days. Tuesday   Yes Historical Provider, MD  ALPRAZolam Duanne Moron) 0.5 MG tablet Take 0.5 mg by mouth at bedtime as needed for sleep.   Yes Historical Provider, MD  amiodarone (PACERONE) 200 MG tablet Take 0.5-1 tablets (100-200 mg total) by mouth daily. 200 mg daily alternating with 100 mg 07/04/14  Yes Christia Reading  Gloriajean Dell, MD  aspirin 81 MG tablet Take 81 mg by mouth daily.     Yes Historical Provider, MD  BREO ELLIPTA 100-25 MCG/INH AEPB Inhale 2 puffs into the lungs daily.  03/07/14  Yes Historical Provider, MD  carvedilol (COREG) 3.125 MG tablet TAKE 1 TABLET (3.125 MG TOTAL) BY MOUTH 2 (TWO) TIMES DAILY. 07/04/14  Yes Minna Merritts, MD  dimenhyDRINATE (DRAMAMINE) 50 MG tablet Take 50 mg by mouth every 8 (eight) hours as needed (for dizziness).    Yes Historical Provider, MD  diphenhydrAMINE (BENADRYL) 25 mg capsule Take 25 mg by mouth See admin instructions. Take 25 mg 2 hours prior to Humira   Yes Historical Provider, MD  ezetimibe (ZETIA) 10 MG tablet Take 1 tablet (10 mg total) by mouth daily. Patient taking differently: Take 10 mg by mouth at bedtime.  07/04/14  Yes Minna Merritts, MD  fluticasone (FLONASE) 50 MCG/ACT nasal spray Place 2 sprays into the nose daily as needed for allergies.    Yes Historical Provider, MD  folic acid (FOLVITE) 1 MG tablet Take 1 tablet by mouth daily.  10/20/10  Yes Historical Provider, MD  hydrocortisone (ANUSOL-HC) 25 MG suppository Place 1 suppository rectally at bedtime as needed. Hemorid 11/30/14  Yes Historical Provider, MD  levothyroxine (SYNTHROID, LEVOTHROID) 50 MCG tablet Take 50 mcg by mouth daily before breakfast.   Yes Historical Provider, MD  losartan (COZAAR) 100 MG tablet TAKE 1 TABLET BY MOUTH EVERY DAY 09/26/14  Yes Minna Merritts, MD   Multiple Vitamin (MULTIVITAMIN) tablet Take 1 tablet by mouth daily.     Yes Historical Provider, MD  nitroGLYCERIN (NITROSTAT) 0.4 MG SL tablet Take 1 tablet (0.4 mg total) by mouth as directed. 06/09/13  Yes Minna Merritts, MD  polyethylene glycol-electrolytes (NULYTELY/GOLYTELY) 420 G solution Take 4,000 mLs by mouth once. 10/25/14  Yes Historical Provider, MD  predniSONE (DELTASONE) 20 MG tablet Take 20 mg by mouth 3 (three) times daily as needed (Start taking 2 days before humira and day of injection).  11/30/13  Yes Historical Provider, MD  PROAIR HFA 108 (90 BASE) MCG/ACT inhaler Inhale 2 puffs into the lungs every 6 (six) hours as needed for wheezing or shortness of breath.  03/14/14  Yes Historical Provider, MD  ranitidine (ZANTAC) 150 MG tablet Take 150 mg by mouth See admin instructions. Take 2 hrs before Humira   Yes Historical Provider, MD  warfarin (COUMADIN) 5 MG tablet TAKE 1 TABLET DAILY BY MOUTH AS DIRECTED. Patient taking differently: TAKE 1 TABLET on Wednesday and Saturday. Take half tablet (2.5mg ) on Monday, Tuesday, Thursday, Friday, & Sunday 11/14/14  Yes Carlena Bjornstad, MD  enoxaparin (LOVENOX) 80 MG/0.8ML injection Inject 80 mg into the skin every 12 (twelve) hours.  10/25/14   Historical Provider, MD  omeprazole (PRILOSEC) 40 MG capsule TAKE 1 CAPSULE BY MOUTH DAILY. 12/26/14   Evans Lance, MD    Allergies as of 12/13/2014 - Review Complete 12/05/2014  Allergen Reaction Noted  . Codeine Nausea Only   . Diltiazem hcl Itching and Rash   . Doxycycline Itching and Rash 04/17/2011  . Infliximab Itching and Rash   . Mercaptopurine Itching and Rash   . Mesalamine Itching and Rash   . Methotrexate Itching and Rash   . Metronidazole Itching and Rash   . Pyridostigmine bromide Nausea Only 04/17/2011    Family History  Problem Relation Age of Onset  . Family history unknown: Yes    History  Social History  . Marital Status: Married    Spouse Name: N/A  . Number of  Children: N/A  . Years of Education: N/A   Occupational History  . retired    Social History Main Topics  . Smoking status: Former Smoker -- 0.25 packs/day for 5 years    Types: Cigarettes  . Smokeless tobacco: Never Used     Comment: 07/30/2013 "stopped smoking ~ 1963"  . Alcohol Use: No  . Drug Use: No  . Sexual Activity: Not Currently   Other Topics Concern  . Not on file   Social History Narrative    Review of Systems: See HPI, otherwise negative ROS  Physical Exam: BP 158/77 mmHg  Pulse 67  Temp(Src) 98.3 F (36.8 C) (Tympanic)  Resp 16  Ht 5\' 10"  (1.778 m)  Wt 97.07 kg (214 lb)  BMI 30.71 kg/m2  SpO2 98% General:   Alert,  pleasant and cooperative in NAD Head:  Normocephalic and atraumatic. Neck:  Supple; no masses or thyromegaly. Lungs:  Clear throughout to auscultation.    Heart:  Regular rate and rhythm. Abdomen:  Soft, nontender and nondistended. Normal bowel sounds, without guarding, and without rebound.   Neurologic:  Alert and  oriented x4;  grossly normal neurologically.  Impression/Plan: KALIN KYLER is here for an colonoscopy to be performed for follow up of Crohn's disease.  Risks, benefits, limitations, and alternatives regarding  colonoscopy have been reviewed with the patient.  Questions have been answered.  All parties agreeable.   Gaylyn Cheers, MD  01/13/2015, 11:25 AM

## 2015-01-13 NOTE — Op Note (Signed)
[facility] Patient Name: [name] [name] Procedure Date: 01/13/2015 11:23 AM MRN: [id number] Account #: [id number] Date of Birth: 12-15-1940 Admit Type: Outpatient Age: 74 Room: [facility] ENDO ROOM 4 Gender: Male Note Status: Finalized Procedure:         Colonoscopy Indications:       Follow-up of Crohn's disease of the colon Providers:         Manya Silvas, MD Referring MD:      Janine Ores. Rosanna Randy, MD (Referring MD) Medicines:         Propofol per Anesthesia Complications:     No immediate complications. Procedure:         Pre-Anesthesia Assessment:                    - After reviewing the risks and benefits, the patient was                     deemed in satisfactory condition to undergo the procedure.                    After obtaining informed consent, the colonoscope was                     passed under direct vision. Throughout the procedure, the                     patient's blood pressure, pulse, and oxygen saturations                     were monitored continuously. The Olympus PCF-160AL                     colonoscope (S#. [id number]) was introduced through the anus                     and advanced to the the ileocolonic anastomosis. The                     colonoscopy was performed without difficulty. The patient                     tolerated the procedure well. The quality of the bowel                     preparation was excellent. Findings:      Diffuse moderate inflammation characterized by altered vascularity,       erythema and granularity was found in the sigmoid colon. Due to uniform       appearance and no sugggestions of any areas of neoplastic tissue I       elected not to biopsy this due to Lovonox and coumadin use.      A diminutive polyp was found in the transverse colon. The polyp was       sessile. The polyp was removed with a cold snare. Resection and       retrieval were complete. To prevent bleeding after the polypectomy, one        hemostatic clip was successfully placed. There was no bleeding at the       end of the procedure.      Internal hemorrhoids were found during endoscopy. The hemorrhoids were       small-sized and Grade I (internal hemorrhoids that do not prolapse). Impression:        -  Diffuse moderate inflammation was found in the sigmoid                     colon.                    - One diminutive polyp in the transverse colon. Resected                     and retrieved. Clip was placed.                    - Internal hemorrhoids. Recommendation:    - Await pathology results. Manya Silvas, MD 01/13/2015 12:16:53 PM This report has been signed electronically. Number of Addenda: 0 Note Initiated On: 01/13/2015 11:23 AM Scope Withdrawal Time: 0 hours 12 minutes 41 seconds  Total Procedure Duration: 0 hours 33 minutes 0 seconds       [facility]

## 2015-01-13 NOTE — Anesthesia Postprocedure Evaluation (Signed)
  Anesthesia Post-op Note  Patient: [name] [name]  Procedure(s) Performed: Procedure(s): COLONOSCOPY (N/A)  Anesthesia type:General  Patient location: PACU  Post pain: Pain level controlled  Post assessment: Post-op Vital signs reviewed, Patient's Cardiovascular Status Stable, Respiratory Function Stable, Patent Airway and No signs of Nausea or vomiting  Post vital signs: Reviewed and stable  Last Vitals:  Filed Vitals:   01/13/15 1250  BP:   Pulse: 65  Temp:   Resp: 19    Level of consciousness: awake, alert  and patient cooperative  Complications: No apparent anesthesia complications

## 2015-01-16 LAB — SURGICAL PATHOLOGY

## 2015-01-17 ENCOUNTER — Other Ambulatory Visit: Payer: Self-pay | Admitting: Family Medicine

## 2015-01-18 ENCOUNTER — Encounter: Payer: Self-pay | Admitting: Unknown Physician Specialty

## 2015-01-18 ENCOUNTER — Ambulatory Visit (INDEPENDENT_AMBULATORY_CARE_PROVIDER_SITE_OTHER): Payer: Medicare Other

## 2015-01-18 DIAGNOSIS — Z5181 Encounter for therapeutic drug level monitoring: Secondary | ICD-10-CM | POA: Diagnosis not present

## 2015-01-18 DIAGNOSIS — I2699 Other pulmonary embolism without acute cor pulmonale: Secondary | ICD-10-CM

## 2015-01-18 DIAGNOSIS — D66 Hereditary factor VIII deficiency: Secondary | ICD-10-CM

## 2015-01-18 DIAGNOSIS — Z7901 Long term (current) use of anticoagulants: Secondary | ICD-10-CM | POA: Diagnosis not present

## 2015-01-18 LAB — POCT INR: INR: 1.2

## 2015-01-18 MED ORDER — ENOXAPARIN SODIUM 80 MG/0.8ML ~~LOC~~ SOLN: 80.0000 mg | Freq: Two times a day (BID) | SUBCUTANEOUS | Status: DC

## 2015-01-25 ENCOUNTER — Ambulatory Visit (INDEPENDENT_AMBULATORY_CARE_PROVIDER_SITE_OTHER): Payer: Medicare Other | Admitting: *Deleted

## 2015-01-25 ENCOUNTER — Ambulatory Visit (INDEPENDENT_AMBULATORY_CARE_PROVIDER_SITE_OTHER): Payer: Medicare Other

## 2015-01-25 DIAGNOSIS — D66 Hereditary factor VIII deficiency: Secondary | ICD-10-CM

## 2015-01-25 DIAGNOSIS — I2699 Other pulmonary embolism without acute cor pulmonale: Secondary | ICD-10-CM

## 2015-01-25 DIAGNOSIS — I4891 Unspecified atrial fibrillation: Secondary | ICD-10-CM

## 2015-01-25 DIAGNOSIS — Z7901 Long term (current) use of anticoagulants: Secondary | ICD-10-CM

## 2015-01-25 DIAGNOSIS — Z5181 Encounter for therapeutic drug level monitoring: Secondary | ICD-10-CM | POA: Diagnosis not present

## 2015-01-25 LAB — POCT INR: INR: 2

## 2015-01-25 NOTE — Progress Notes (Signed)
Remote pacemaker transmission.   

## 2015-01-27 LAB — CUP PACEART REMOTE DEVICE CHECK
Brady Statistic RA Percent Paced: 90 %
Date Time Interrogation Session: 20160617093327
Lead Channel Impedance Value: 468 Ohm
Lead Channel Impedance Value: 488 Ohm
Lead Channel Sensing Intrinsic Amplitude: 3.5 mV
Lead Channel Setting Pacing Amplitude: 1.4 V
Lead Channel Setting Pacing Amplitude: 1.9 V
Lead Channel Setting Pacing Pulse Width: 0.4 ms
MDC IDC MSMT LEADCHNL RA PACING THRESHOLD AMPLITUDE: 0.9 V
MDC IDC MSMT LEADCHNL RV PACING THRESHOLD AMPLITUDE: 1 V
MDC IDC MSMT LEADCHNL RV SENSING INTR AMPL: 9.9 mV
MDC IDC STAT BRADY RV PERCENT PACED: 1 %
Pulse Gen Model: 359529
Pulse Gen Serial Number: 68066723

## 2015-02-01 ENCOUNTER — Encounter: Payer: Self-pay | Admitting: Cardiology

## 2015-02-06 ENCOUNTER — Other Ambulatory Visit: Payer: Self-pay

## 2015-02-06 ENCOUNTER — Encounter: Payer: Self-pay | Admitting: Internal Medicine

## 2015-02-22 ENCOUNTER — Ambulatory Visit (INDEPENDENT_AMBULATORY_CARE_PROVIDER_SITE_OTHER): Payer: Medicare Other

## 2015-02-22 DIAGNOSIS — Z5181 Encounter for therapeutic drug level monitoring: Secondary | ICD-10-CM | POA: Diagnosis not present

## 2015-02-22 DIAGNOSIS — I2699 Other pulmonary embolism without acute cor pulmonale: Secondary | ICD-10-CM | POA: Diagnosis not present

## 2015-02-22 DIAGNOSIS — D66 Hereditary factor VIII deficiency: Secondary | ICD-10-CM

## 2015-02-22 DIAGNOSIS — Z7901 Long term (current) use of anticoagulants: Secondary | ICD-10-CM | POA: Diagnosis not present

## 2015-02-22 LAB — POCT INR: INR: 2.4

## 2015-03-02 ENCOUNTER — Telehealth: Payer: Self-pay | Admitting: Family Medicine

## 2015-03-02 DIAGNOSIS — G20A1 Parkinson's disease without dyskinesia, without mention of fluctuations: Secondary | ICD-10-CM

## 2015-03-02 DIAGNOSIS — G20 Parkinson's disease: Secondary | ICD-10-CM

## 2015-03-02 NOTE — Telephone Encounter (Signed)
Spoke with patient's wife-she states that about 2 weeks ago he was outside putting food in the bird feeder and thought he hurt his back but did not mention anything since until the past 2 days-pain gotten worse. Tylenol is not helping. Pain is not radiating anywhere that she knows of or that patient can tell. Appt is on Monday. Anything we can start before then or what needs to be done? Please review-aa

## 2015-03-02 NOTE — Telephone Encounter (Signed)
Patient is having bad back pain.  He made an appt to come see you Monday but wants to know if there is something he can take in the mean time.  Please advise. [phone].   They use [facility]

## 2015-03-02 NOTE — Telephone Encounter (Signed)
Advised patient's wife and appt made to see dennis tomorrow-aa

## 2015-03-02 NOTE — Telephone Encounter (Signed)
Due to his health issues reluctant just to call something in. Maybe see Tawanna Sat on Friday if he  is really hurting badly.

## 2015-03-03 ENCOUNTER — Ambulatory Visit (INDEPENDENT_AMBULATORY_CARE_PROVIDER_SITE_OTHER): Payer: Medicare Other | Admitting: Family Medicine

## 2015-03-03 ENCOUNTER — Ambulatory Visit: Payer: Self-pay | Admitting: Family Medicine

## 2015-03-03 ENCOUNTER — Encounter: Payer: Self-pay | Admitting: Family Medicine

## 2015-03-03 VITALS — BP 132/72 | HR 68 | Temp 98.0°F | Resp 20 | Wt 215.0 lb

## 2015-03-03 DIAGNOSIS — M545 Low back pain, unspecified: Secondary | ICD-10-CM

## 2015-03-03 DIAGNOSIS — Z9889 Other specified postprocedural states: Secondary | ICD-10-CM

## 2015-03-03 MED ORDER — METHOCARBAMOL 500 MG PO TABS: 500.0000 mg | ORAL_TABLET | Freq: Four times a day (QID) | ORAL | Status: DC

## 2015-03-03 NOTE — Patient Instructions (Signed)
Back Pain, Adult Low back pain is very common. About 1 in 5 people have back pain.The cause of low back pain is rarely dangerous. The pain often gets better over time.About half of people with a sudden onset of back pain feel better in just 2 weeks. About 8 in 10 people feel better by 6 weeks.  CAUSES Some common causes of back pain include:  Strain of the muscles or ligaments supporting the spine.  Wear and tear (degeneration) of the spinal discs.  Arthritis.  Direct injury to the back. DIAGNOSIS Most of the time, the direct cause of low back pain is not known.However, back pain can be treated effectively even when the exact cause of the pain is unknown.Answering your caregiver's questions about your overall health and symptoms is one of the most accurate ways to make sure the cause of your pain is not dangerous. If your caregiver needs more information, he or she may order lab work or imaging tests (X-rays or MRIs).However, even if imaging tests show changes in your back, this usually does not require surgery. HOME CARE INSTRUCTIONS For many people, back pain returns.Since low back pain is rarely dangerous, it is often a condition that people can learn to manageon their own.   Remain active. It is stressful on the back to sit or stand in one place. Do not sit, drive, or stand in one place for more than 30 minutes at a time. Take short walks on level surfaces as soon as pain allows.Try to increase the length of time you walk each day.  Do not stay in bed.Resting more than 1 or 2 days can delay your recovery.  Do not avoid exercise or work.Your body is made to move.It is not dangerous to be active, even though your back may hurt.Your back will likely heal faster if you return to being active before your pain is gone.  Pay attention to your body when you bend and lift. Many people have less discomfortwhen lifting if they bend their knees, keep the load close to their bodies,and  avoid twisting. Often, the most comfortable positions are those that put less stress on your recovering back.  Find a comfortable position to sleep. Use a firm mattress and lie on your side with your knees slightly bent. If you lie on your back, put a pillow under your knees.  Only take over-the-counter or prescription medicines as directed by your caregiver. Over-the-counter medicines to reduce pain and inflammation are often the most helpful.Your caregiver may prescribe muscle relaxant drugs.These medicines help dull your pain so you can more quickly return to your normal activities and healthy exercise.  Put ice on the injured area.  Put ice in a plastic bag.  Place a towel between your skin and the bag.  Leave the ice on for 15-20 minutes, 03-04 times a day for the first 2 to 3 days. After that, ice and heat may be alternated to reduce pain and spasms.  Ask your caregiver about trying back exercises and gentle massage. This may be of some benefit.  Avoid feeling anxious or stressed.Stress increases muscle tension and can worsen back pain.It is important to recognize when you are anxious or stressed and learn ways to manage it.Exercise is a great option. SEEK MEDICAL CARE IF:  You have pain that is not relieved with rest or medicine.  You have pain that does not improve in 1 week.  You have new symptoms.  You are generally not feeling well. SEEK   IMMEDIATE MEDICAL CARE IF:   You have pain that radiates from your back into your legs.  You develop new bowel or bladder control problems.  You have unusual weakness or numbness in your arms or legs.  You develop nausea or vomiting.  You develop abdominal pain.  You feel faint. Document Released: 07/29/2005 Document Revised: 01/28/2012 Document Reviewed: 11/30/2013 [employer] Patient Information 2015 [employer]. This information is not intended to replace advice given to you by your health care provider. Make sure you  discuss any questions you have with your health care provider.  

## 2015-03-03 NOTE — Progress Notes (Signed)
Subjective:     Patient ID: [name] [name], male   DOB: [birth year], 74 y.o.   MRN: [id number]  Back Pain This is a new problem. The current episode started 1 to 4 weeks ago. The problem occurs intermittently. The problem has been gradually worsening since onset. The quality of the pain is described as aching. Radiates to: the back of both legs  The pain is at a severity of 5/10. The pain is moderate. The pain is the same all the time. Stiffness is present all day. Associated symptoms include leg pain. Pertinent negatives include no bladder incontinence, bowel incontinence, dysuria, fever, tingling or weakness. Treatments tried: OTC Tylenol  The treatment provided no relief.  Some relief from heating pad at onset. Past Medical History  Diagnosis Date  . Syncope and collapse     Evaluation by Dr Caryl Comes; Syncope probably neutrally mediated and modest resting sinus bradycardia. and 16 B. episode of SVT that was either A fib or another type of SVT. Patient improved with combination of holding ACE inhibitor or low BP and reducing beta blocker for bradycardia  . Hypertension                                                                                                                                                                                                                                                                                                                                                                                                                       . Coronary  artery disease     a. CABG 2008. b. Relook cath 08/12/08 - all 5 grafts are patent  . Crohn's disease     Humara Rx in past  . SVT (supraventricular tachycardia)   . 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 History of benign colon tumor   . Mitral regurgitation     mild - echo 4/09  . Anxiety   . Gait difficulty     with Crestor (but tolerates simva)  . Pulmonary embolism     2007,b CT scan, IVC fiter placed then because of  concern about coumadin use at that time.  . Orthostasis     treated  . Drug therapy     Intermittant steroids   . Warfarin anticoagulation   . Factor VIII     Factor VIII EXCESS.Marland KitchenMarland KitchenDr Gwenlyn Fudge.Marland Kitchenanother reason for coumadin  . Incomplete RBBB   . Drug therapy     question rash diltiazem, and amio  . Ejection fraction     EF 60-65%, echo, March, 2012, moderate diastolic dysfunction  . 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 Melvyn Novas; has chronic shortness of breath  . PAF (paroxysmal atrial fibrillation)     April, 2013, new diagnosis  . Bradycardia     a. s/p Biotronic pacemaker placement 04/02/13. b. Lead removal/new lead insertion 07/2013 due to dysfunction.  . Carotid artery disease     Dopplers to be checked, June, 2013  . Cough     October, 2013  . Tremor     October, 2013  . High cholesterol   . OSA (obstructive sleep apnea)     "quit wearing his mask" (07/30/2013)  . GERD (gastroesophageal reflux disease)    Past Surgical History  Procedure Laterality Date  . Cholecystectomy    . Back surgery    . Cataract extraction w/ intraocular lens  implant, bilateral Bilateral 1990's  . Colonoscopy w/ polypectomy    . Loop monitor  03/2013    "removed w/in 2 days" (07/30/2013)  . Lead revision  07/30/2013    DDD PM leads removed and a new set of PPM leads inserted via the left subclavian vein. [id number]  . Tonsillectomy    . Insert / replace / remove pacemaker  03/2013    [facility]  . Cardiac catheterization  03/2013    [facility]  . Cardiac catheterization  2008; ~ 2011  . Coronary artery bypass graft  4/08    CABG X5  . Lumbar disc surgery  1978; 1986  . Colon surgery      "had a tumor removed" (07/30/2013)  . Vena cava filter placement  2007    PE  . Loop recorder implant N/A 03/24/2013    Procedure: LOOP RECORDER IMPLANT;  Surgeon: Deboraha Sprang, MD;  Location: [facility] CATH LAB;  Service: Cardiovascular;  Laterality: N/A;  . Permanent pacemaker insertion N/A 04/02/2013    Procedure: PERMANENT  PACEMAKER INSERTION;  Surgeon: Evans Lance, MD;  Location: [facility] CATH LAB;  Service: Cardiovascular;  Laterality: N/A;  . Lead revision N/A 07/30/2013    Procedure: LEAD REVISION;  Surgeon: Evans Lance, MD;  Location: [facility] CATH LAB;  Service: Cardiovascular;  Laterality: N/A;  . Colonoscopy N/A 01/13/2015    Procedure: COLONOSCOPY;  Surgeon: Manya Silvas, MD;  Location: [facility] ENDOSCOPY;  Service: Endoscopy;  Laterality: N/A;   History  Substance Use Topics  . Smoking status: Former Smoker -- 0.25 packs/day for 5 years  Types: Cigarettes  . Smokeless tobacco: Never Used     Comment: 07/30/2013 "stopped smoking ~ 1963"  . Alcohol Use: No   Family History  Problem Relation Age of Onset  . Family history unknown: Yes   Current Outpatient Prescriptions on File Prior to Visit  Medication Sig Dispense Refill  . acetaminophen (TYLENOL) 325 MG tablet Take 650 mg by mouth every 6 (six) hours as needed for pain.    Marland Kitchen adalimumab (HUMIRA PEN) 40 MG/0.8ML injection Inject 40 mg into the skin every 21 ( twenty-one) days. Tuesday    . ALPRAZolam (XANAX) 0.5 MG tablet Take 0.5 mg by mouth at bedtime as needed for sleep.    Marland Kitchen amiodarone (PACERONE) 200 MG tablet Take 0.5-1 tablets (100-200 mg total) by mouth daily. 200 mg daily alternating with 100 mg 90 tablet 3  . aspirin 81 MG tablet Take 81 mg by mouth daily.      Marland Kitchen BREO ELLIPTA 100-25 MCG/INH AEPB INHALE 1 PUFF DAILY 60 each 3  . carvedilol (COREG) 3.125 MG tablet TAKE 1 TABLET (3.125 MG TOTAL) BY MOUTH 2 (TWO) TIMES DAILY. 180 tablet 3  . dimenhyDRINATE (DRAMAMINE) 50 MG tablet Take 50 mg by mouth every 8 (eight) hours as needed (for dizziness).     . diphenhydrAMINE (BENADRYL) 25 mg capsule Take 25 mg by mouth See admin instructions. Take 25 mg 2 hours prior to Humira    . ezetimibe (ZETIA) 10 MG tablet Take 1 tablet (10 mg total) by mouth daily. (Patient taking differently: Take 10 mg by mouth at bedtime. ) 90 tablet 3  . fluticasone (FLONASE)  50 MCG/ACT nasal spray Place 2 sprays into the nose daily as needed for allergies.     . folic acid (FOLVITE) 1 MG tablet Take 1 tablet by mouth daily.     . hydrocortisone (ANUSOL-HC) 25 MG suppository Place 1 suppository rectally at bedtime as needed. Hemorid  1  . losartan (COZAAR) 100 MG tablet TAKE 1 TABLET BY MOUTH EVERY DAY 90 tablet 3  . Multiple Vitamin (MULTIVITAMIN) tablet Take 1 tablet by mouth daily.      . nitroGLYCERIN (NITROSTAT) 0.4 MG SL tablet Take 1 tablet (0.4 mg total) by mouth as directed. 25 tablet 6  . omeprazole (PRILOSEC) 40 MG capsule TAKE 1 CAPSULE BY MOUTH DAILY. 30 capsule 5  . polyethylene glycol-electrolytes (NULYTELY/GOLYTELY) 420 G solution Take 4,000 mLs by mouth once.  0  . predniSONE (DELTASONE) 20 MG tablet Take 20 mg by mouth 3 (three) times daily as needed (Start taking 2 days before humira and day of injection).     Marland Kitchen PROAIR HFA 108 (90 BASE) MCG/ACT inhaler Inhale 2 puffs into the lungs every 6 (six) hours as needed for wheezing or shortness of breath.     . ranitidine (ZANTAC) 150 MG tablet Take 150 mg by mouth See admin instructions. Take 2 hrs before Humira    . warfarin (COUMADIN) 5 MG tablet TAKE 1 TABLET DAILY BY MOUTH AS DIRECTED. (Patient taking differently: TAKE 1 TABLET on Wednesday and Saturday. Take half tablet (2.5mg ) on Monday, Tuesday, Thursday, Friday, & Sunday) 40 tablet 3  . levothyroxine (SYNTHROID, LEVOTHROID) 50 MCG tablet Take 50 mcg by mouth daily before breakfast.     No current facility-administered medications on file prior to visit.   Allergies  Allergen Reactions  . Codeine Nausea Only  . Diltiazem Hcl Itching and Rash  . Doxycycline Itching and Rash  . Infliximab Itching and Rash  .  Mercaptopurine Itching and Rash  . Mesalamine Itching and Rash  . Methotrexate Itching and Rash  . Metronidazole Itching and Rash  . Pyridostigmine Bromide Nausea Only     Review of Systems  Constitutional: Negative for fever.   Gastrointestinal: Negative for bowel incontinence.  Genitourinary: Negative for bladder incontinence and dysuria.  Musculoskeletal: Positive for back pain.  Neurological: Negative for tingling and weakness.       BP 132/72 mmHg  Pulse 68  Temp(Src) 98 F (36.7 C)  Resp 20  Wt 215 lb (97.523 kg)  SpO2 94%  Objective:   Physical Exam  Constitutional: He is oriented to person, place, and time. He appears well-developed and well-nourished. No distress.  HENT:  Head: Normocephalic and atraumatic.  Right Ear: Hearing normal.  Left Ear: Hearing normal.  Nose: Nose normal.  Eyes: Conjunctivae and lids are normal. Right eye exhibits no discharge. Left eye exhibits no discharge. No scleral icterus.  Pulmonary/Chest: Effort normal. No respiratory distress.  Musculoskeletal: He exhibits no tenderness.  Discomfort in the middle of lower lumbar region to transfer from sitting to standing. Resolves after standing erect and walking a few steps.  Neurological: He is alert and oriented to person, place, and time.  Poor balance without use of cane.  Skin: Skin is intact. No lesion and no rash noted.  Psychiatric: He has a normal mood and affect. His speech is normal and behavior is normal. Thought content normal.      Assessment:     1. Midline low back pain without sciatica Onset over the past 3 weeks and only occurs as he transitions from sitting to standing. Balance is poor and feels more stable with using a cane. May continue moist heat applications and add a muscle relaxer for a short time. May need an extended prednisone taper instead of the usual 3 day dosage prior to his Humira dosage each Tuesday. Keep follow up appointment with Dr. Rosanna Randy on 03-08-15. - methocarbamol (ROBAXIN) 500 MG tablet; Take 1 tablet (500 mg total) by mouth 4 (four) times daily.  Dispense: 30 tablet; Refill: 0  2. Hx of decompressive lumbar laminectomy Well healed from laminectomies in 1970 and 1984. No residual  leg pain or numbness. Some weakness of left foot dorsiflexion since these surgeries.     Plan:     Given back precautions and rehab recommendations on printout today.

## 2015-03-06 ENCOUNTER — Ambulatory Visit: Payer: Self-pay | Admitting: Family Medicine

## 2015-03-08 ENCOUNTER — Encounter: Payer: Self-pay | Admitting: Family Medicine

## 2015-03-08 ENCOUNTER — Ambulatory Visit (INDEPENDENT_AMBULATORY_CARE_PROVIDER_SITE_OTHER): Payer: Medicare Other | Admitting: Family Medicine

## 2015-03-08 VITALS — BP 120/72 | HR 64 | Temp 97.5°F | Resp 16

## 2015-03-08 DIAGNOSIS — E669 Obesity, unspecified: Secondary | ICD-10-CM | POA: Insufficient documentation

## 2015-03-08 DIAGNOSIS — I82409 Acute embolism and thrombosis of unspecified deep veins of unspecified lower extremity: Secondary | ICD-10-CM | POA: Insufficient documentation

## 2015-03-08 DIAGNOSIS — E7800 Pure hypercholesterolemia, unspecified: Secondary | ICD-10-CM | POA: Insufficient documentation

## 2015-03-08 DIAGNOSIS — E119 Type 2 diabetes mellitus without complications: Secondary | ICD-10-CM | POA: Insufficient documentation

## 2015-03-08 DIAGNOSIS — G4733 Obstructive sleep apnea (adult) (pediatric): Secondary | ICD-10-CM | POA: Insufficient documentation

## 2015-03-08 DIAGNOSIS — R4701 Aphasia: Secondary | ICD-10-CM

## 2015-03-08 DIAGNOSIS — K5090 Crohn's disease, unspecified, without complications: Secondary | ICD-10-CM | POA: Insufficient documentation

## 2015-03-08 DIAGNOSIS — M545 Low back pain, unspecified: Secondary | ICD-10-CM

## 2015-03-08 DIAGNOSIS — G3109 Other frontotemporal dementia: Secondary | ICD-10-CM

## 2015-03-08 DIAGNOSIS — G309 Alzheimer's disease, unspecified: Secondary | ICD-10-CM | POA: Insufficient documentation

## 2015-03-08 DIAGNOSIS — J45909 Unspecified asthma, uncomplicated: Secondary | ICD-10-CM | POA: Insufficient documentation

## 2015-03-08 DIAGNOSIS — H1130 Conjunctival hemorrhage, unspecified eye: Secondary | ICD-10-CM | POA: Insufficient documentation

## 2015-03-08 DIAGNOSIS — I2510 Atherosclerotic heart disease of native coronary artery without angina pectoris: Secondary | ICD-10-CM | POA: Insufficient documentation

## 2015-03-08 DIAGNOSIS — G20 Parkinson's disease: Secondary | ICD-10-CM | POA: Diagnosis not present

## 2015-03-08 DIAGNOSIS — D490 Neoplasm of unspecified behavior of digestive system: Secondary | ICD-10-CM | POA: Insufficient documentation

## 2015-03-08 DIAGNOSIS — J986 Disorders of diaphragm: Secondary | ICD-10-CM | POA: Insufficient documentation

## 2015-03-08 DIAGNOSIS — I951 Orthostatic hypotension: Secondary | ICD-10-CM | POA: Insufficient documentation

## 2015-03-08 DIAGNOSIS — G939 Disorder of brain, unspecified: Secondary | ICD-10-CM | POA: Insufficient documentation

## 2015-03-08 DIAGNOSIS — N19 Unspecified kidney failure: Secondary | ICD-10-CM | POA: Insufficient documentation

## 2015-03-08 DIAGNOSIS — G903 Multi-system degeneration of the autonomic nervous system: Secondary | ICD-10-CM | POA: Insufficient documentation

## 2015-03-08 DIAGNOSIS — Z86718 Personal history of other venous thrombosis and embolism: Secondary | ICD-10-CM | POA: Insufficient documentation

## 2015-03-08 DIAGNOSIS — F0280 Dementia in other diseases classified elsewhere without behavioral disturbance: Secondary | ICD-10-CM | POA: Insufficient documentation

## 2015-03-08 DIAGNOSIS — F419 Anxiety disorder, unspecified: Secondary | ICD-10-CM | POA: Insufficient documentation

## 2015-03-08 DIAGNOSIS — D649 Anemia, unspecified: Secondary | ICD-10-CM | POA: Insufficient documentation

## 2015-03-08 DIAGNOSIS — E039 Hypothyroidism, unspecified: Secondary | ICD-10-CM | POA: Insufficient documentation

## 2015-03-08 DIAGNOSIS — K219 Gastro-esophageal reflux disease without esophagitis: Secondary | ICD-10-CM | POA: Insufficient documentation

## 2015-03-08 DIAGNOSIS — I6782 Cerebral ischemia: Secondary | ICD-10-CM | POA: Insufficient documentation

## 2015-03-08 DIAGNOSIS — R7303 Prediabetes: Secondary | ICD-10-CM | POA: Insufficient documentation

## 2015-03-08 DIAGNOSIS — I509 Heart failure, unspecified: Secondary | ICD-10-CM | POA: Insufficient documentation

## 2015-03-08 DIAGNOSIS — K589 Irritable bowel syndrome without diarrhea: Secondary | ICD-10-CM | POA: Insufficient documentation

## 2015-03-08 MED ORDER — CARBIDOPA-LEVODOPA 25-100 MG PO TABS: 1.0000 | ORAL_TABLET | Freq: Three times a day (TID) | ORAL | Status: DC

## 2015-03-08 NOTE — Patient Instructions (Addendum)
Start the Carbidopa-Levaodopa with a 1/2 tablet for 2 weeks. Then one tablet three times daily. We will get in touch with Dr. Manuella Ghazi and get back in touch with you if we need to change anything.

## 2015-03-08 NOTE — Progress Notes (Signed)
Patient ID: [name] [name], male   DOB: 08-17-1940, 74 y.o.   MRN: [id number]    Subjective:  HPI Pt is here today for a 5 day f/u of back pain with sciatica. HE was seen by Simona Huh and he started him on Methocarbamol and said that he may need his prednisone extended, but pt was not given Prednisone. Pt is not any better. Wife reports that he was little better yesterday. He walked the length of the house without a cane but today he can barely walk with the cane. She is afraid that this is not coming from the back and could be something Neurological from his Parkinson's syndrome. Pt is having weakness in his legs and needed a wheel chair when I went to pull him back. He looked very unsteady and I was afraid he would fall coming back to the room.     Prior to Admission medications   Medication Sig Start Date End Date Taking? Authorizing Provider  acetaminophen (TYLENOL) 325 MG tablet Take 650 mg by mouth every 6 (six) hours as needed for pain.   Yes Historical Provider, MD  adalimumab (HUMIRA PEN) 40 MG/0.8ML injection Inject 40 mg into the skin every 21 ( twenty-one) days. Tuesday   Yes Historical Provider, MD  ALPRAZolam Duanne Moron) 0.5 MG tablet Take 0.5 mg by mouth at bedtime as needed for sleep.   Yes Historical Provider, MD  amiodarone (PACERONE) 200 MG tablet Take 0.5-1 tablets (100-200 mg total) by mouth daily. 200 mg daily alternating with 100 mg 07/04/14  Yes Minna Merritts, MD  aspirin 81 MG tablet Take 81 mg by mouth daily.     Yes Historical Provider, MD  BREO ELLIPTA 100-25 MCG/INH AEPB INHALE 1 PUFF DAILY 01/18/15  Yes Jerrol Banana., MD  carvedilol (COREG) 3.125 MG tablet TAKE 1 TABLET (3.125 MG TOTAL) BY MOUTH 2 (TWO) TIMES DAILY. 07/04/14  Yes Minna Merritts, MD  dimenhyDRINATE (DRAMAMINE) 50 MG tablet Take 50 mg by mouth every 8 (eight) hours as needed (for dizziness).    Yes Historical Provider, MD  diphenhydrAMINE (BENADRYL) 25 mg capsule Take 25 mg by mouth See admin  instructions. Take 25 mg 2 hours prior to Humira   Yes Historical Provider, MD  enoxaparin (LOVENOX) 80 MG/0.8ML injection Inject 0.8 mLs (80 mg total) into the skin every 12 (twelve) hours. 01/18/15  Yes Minna Merritts, MD  ezetimibe (ZETIA) 10 MG tablet Take 1 tablet (10 mg total) by mouth daily. Patient taking differently: Take 10 mg by mouth at bedtime.  07/04/14  Yes Minna Merritts, MD  fluticasone (FLONASE) 50 MCG/ACT nasal spray Place 2 sprays into the nose daily as needed for allergies.    Yes Historical Provider, MD  folic acid (FOLVITE) 1 MG tablet Take 1 tablet by mouth daily.  10/20/10  Yes Historical Provider, MD  hydrocortisone (ANUSOL-HC) 25 MG suppository Place 1 suppository rectally at bedtime as needed. Hemorid 11/30/14  Yes Historical Provider, MD  levothyroxine (SYNTHROID, LEVOTHROID) 50 MCG tablet Take 50 mcg by mouth daily before breakfast.   Yes Historical Provider, MD  losartan (COZAAR) 100 MG tablet TAKE 1 TABLET BY MOUTH EVERY DAY 09/26/14  Yes Minna Merritts, MD  methocarbamol (ROBAXIN) 500 MG tablet Take 1 tablet (500 mg total) by mouth 4 (four) times daily. 03/03/15  Yes Dennis E Chrismon, PA  Multiple Vitamin (MULTIVITAMIN) tablet Take 1 tablet by mouth daily.     Yes Historical Provider, MD  nitroGLYCERIN (NITROSTAT)  0.4 MG SL tablet Take 1 tablet (0.4 mg total) by mouth as directed. 06/09/13  Yes Minna Merritts, MD  omeprazole (PRILOSEC) 40 MG capsule TAKE 1 CAPSULE BY MOUTH DAILY. 12/26/14  Yes Evans Lance, MD  polyethylene glycol-electrolytes (NULYTELY/GOLYTELY) 420 G solution Take 4,000 mLs by mouth once. 10/25/14  Yes Historical Provider, MD  predniSONE (DELTASONE) 20 MG tablet Take 20 mg by mouth 3 (three) times daily as needed (Start taking 2 days before humira and day of injection).  11/30/13  Yes Historical Provider, MD  PROAIR HFA 108 (90 BASE) MCG/ACT inhaler Inhale 2 puffs into the lungs every 6 (six) hours as needed for wheezing or shortness of breath.   03/14/14  Yes Historical Provider, MD  ranitidine (ZANTAC) 150 MG tablet Take 150 mg by mouth See admin instructions. Take 2 hrs before Humira   Yes Historical Provider, MD  warfarin (COUMADIN) 5 MG tablet TAKE 1 TABLET DAILY BY MOUTH AS DIRECTED. Patient taking differently: TAKE 1 TABLET on Wednesday and Saturday. Take half tablet (2.5mg ) on Monday, Tuesday, Thursday, Friday, & Sunday 11/14/14  Yes Carlena Bjornstad, MD    Patient Active Problem List   Diagnosis Date Noted  . Acquired hypothyroidism 03/08/2015  . Absolute anemia 03/08/2015  . Anxiety 03/08/2015  . Airway hyperreactivity 03/08/2015  . CAD in native artery 03/08/2015  . CCF (congestive cardiac failure) 03/08/2015  . Neoplasm of digestive system 03/08/2015  . Regional enteritis 03/08/2015  . Deep vein thrombosis 03/08/2015  . Dementia of frontal lobe type 03/08/2015  . Elevated diaphragm 03/08/2015  . Acid reflux 03/08/2015  . Borderline diabetes 03/08/2015  . H/O deep venous thrombosis 03/08/2015  . Hypercholesteremia 03/08/2015  . Adiposity 03/08/2015  . Obstructive apnea 03/08/2015  . Idiopathic Parkinson's disease 03/08/2015  . 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 Kidney failure 03/08/2015  . Dysautonomia orthostatic hypotension syndrome 03/08/2015  . Irritable colon 03/08/2015  . Jasper (subconjunctival hemorrhage) 03/08/2015  . Temporary cerebral vascular dysfunction 03/08/2015  . Diabetes mellitus, type 2 03/08/2015  . Non-fluent aphasia 06/27/2014  . Hyperlipidemia 06/06/2014  . Atherosclerosis of coronary artery 04/26/2014  . Arterial disease 04/26/2014  . Essential (primary) hypertension 04/26/2014  . Disorder of mitral valve 04/26/2014  . Other pulmonary embolism and infarction 04/26/2014  . Postsurgical aortocoronary bypass status 04/26/2014  . Hyperlipemia 12/07/2013  . Encounter for therapeutic drug monitoring 09/22/2013  . Bradycardia   . Mechanical complication of other vascular  device, implant, and graft 07/30/2013  . Pacemaker 07/13/2013  . Groin hematoma 05/10/2013  . Poor memory 10/28/2012  . Amnesia 10/28/2012  . Cough   . Tremor   . Atrial fibrillation   . Carotid artery disease   . Hx of amiodarone therapy 12/06/2011  . 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 Syncope and collapse   . Hypertension   . Coronary artery disease   . Mitral regurgitation   . Pulmonary embolism   . Orthostasis   . Drug therapy   . S/P IVC filter   . Warfarin anticoagulation   . Factor VIII   . Hx of CABG   . Incomplete RBBB   . Drug therapy   . Shortness of breath 10/25/2010  . Crohn's disease 01/25/2009    Past Medical History  Diagnosis Date  . Syncope and collapse     Evaluation by Dr Caryl Comes; Syncope probably neutrally mediated and modest resting sinus bradycardia. and 16 B. episode of SVT that was either A fib or  another type of SVT. Patient improved with combination of holding ACE inhibitor or low BP and reducing beta blocker for bradycardia  . Hypertension                                                                                                                                                                                                                                                                                                                                                                                                                       . Coronary artery disease     a. CABG 2008. b. Relook cath 08/12/08 - all 5 grafts are patent  . Crohn's disease     Humara Rx in past  . SVT (supraventricular tachycardia)   . 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 History of benign colon tumor   . Mitral regurgitation     mild - echo 4/09  . Anxiety   . Gait difficulty     with Crestor (but tolerates simva)  . Pulmonary embolism     2007,b CT scan, IVC fiter placed then because of concern about coumadin use at that time.  . Orthostasis     treated  . Drug  therapy     Intermittant steroids   . Warfarin anticoagulation   . Factor VIII     Factor VIII EXCESS.Marland KitchenMarland KitchenDr Gwenlyn Fudge.Marland Kitchenanother reason for coumadin  . Incomplete RBBB   . Drug therapy     question rash diltiazem, and amio  .  Ejection fraction     EF 60-65%, echo, March, 2012, moderate diastolic dysfunction  . 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 Melvyn Novas; has chronic shortness of breath  . PAF (paroxysmal atrial fibrillation)     April, 2013, new diagnosis  . Bradycardia     a. s/p Biotronic pacemaker placement 04/02/13. b. Lead removal/new lead insertion 07/2013 due to dysfunction.  . Carotid artery disease     Dopplers to be checked, June, 2013  . Cough     October, 2013  . Tremor     October, 2013  . High cholesterol   . OSA (obstructive sleep apnea)     "quit wearing his mask" (07/30/2013)  . GERD (gastroesophageal reflux disease)     History   Social History  . Marital Status: Married    Spouse Name: N/A  . Number of Children: N/A  . Years of Education: N/A   Occupational History  . retired    Social History Main Topics  . Smoking status: Former Smoker -- 0.25 packs/day for 5 years    Types: Cigarettes  . Smokeless tobacco: Never Used     Comment: 07/30/2013 "stopped smoking ~ 1963"  . Alcohol Use: No  . Drug Use: No  . Sexual Activity: Not Currently   Other Topics Concern  . Not on file   Social History Narrative    Allergies  Allergen Reactions  . Codeine Nausea Only  . Diltiazem Hcl Itching and Rash  . Doxycycline Itching and Rash  . Infliximab Itching and Rash  . Mercaptopurine Itching and Rash  . Mesalamine Itching and Rash  . Methotrexate Itching and Rash  . Metronidazole Itching and Rash  . Pyridostigmine Bromide Nausea Only    Review of Systems  HENT: Negative.   Eyes: Negative.   Respiratory: Negative.   Cardiovascular: Negative.   Gastrointestinal: Negative.   Genitourinary: Negative.   Musculoskeletal: Positive for back pain.  Skin:  Negative.   Neurological: Positive for weakness.  Endo/Heme/Allergies: Negative.   Psychiatric/Behavioral: Negative.     Immunization History  Administered Date(s) Administered  . Influenza-Unspecified 05/12/2013   Objective:  BP 120/72 mmHg  Pulse 64  Temp(Src) 97.5 F (36.4 C) (Oral)  Resp 16  Physical Exam  Constitutional: He is oriented to person, place, and time and well-developed, well-nourished, and in no distress.  HENT:  Head: Normocephalic and atraumatic.  Right Ear: External ear normal.  Left Ear: External ear normal.  Nose: Nose normal.  Eyes: Conjunctivae and EOM are normal. Pupils are equal, round, and reactive to light.  Neck: Normal range of motion. Neck supple.  Cardiovascular: Normal rate, regular rhythm, normal heart sounds and intact distal pulses.   Pulmonary/Chest: Effort normal and breath sounds normal.  Abdominal: Soft. Bowel sounds are normal.  Musculoskeletal: Normal range of motion.  Neurological: He is alert and oriented to person, place, and time. He has normal reflexes. Gait normal. GCS score is 15.  Skin: Skin is warm and dry.  Psychiatric: Mood, memory, affect and judgment normal.    Lab Results  Component Value Date   WBC 8.5 10/25/2013   HGB 13.4 10/25/2013   HCT 39.3* 10/25/2013   PLT 221 10/25/2013   GLUCOSE 86 10/27/2013   CHOL 121 03/21/2013   TRIG 66 03/21/2013   HDL 49 03/21/2013   LDLCALC 59 03/21/2013   TSH 2.717 04/02/2013   INR 2.4 02/22/2015    CMP     Component Value Date/Time   NA  136 10/27/2013 0532   NA 134* 07/30/2013 1058   NA 132* 10/28/2012 1501   K 3.6 10/27/2013 0532   K 4.5 07/30/2013 1058   CL 106 10/27/2013 0532   CL 99 07/30/2013 1058   CO2 25 10/27/2013 0532   CO2 26 07/30/2013 1058   GLUCOSE 86 10/27/2013 0532   GLUCOSE 90 07/30/2013 1058   GLUCOSE 105* 10/28/2012 1501   BUN 14 10/27/2013 0532   BUN 15 07/30/2013 1058   BUN 15 10/28/2012 1501   CREATININE 1.03 10/27/2013 0532   CREATININE  1.06 07/30/2013 1058   CALCIUM 8.1* 10/27/2013 0532   CALCIUM 8.8 07/30/2013 1058   PROT 7.4 10/24/2013 1122   PROT 5.7* 03/24/2013 0530   ALBUMIN 3.6 10/24/2013 1122   ALBUMIN 3.0* 03/24/2013 0530   AST 113* 10/24/2013 1122   AST 98* 03/24/2013 0530   ALT 146* 10/24/2013 1122   ALT 162* 03/24/2013 0530   ALKPHOS 95 10/24/2013 1122   ALKPHOS 157* 03/24/2013 0530   BILITOT 1.3* 10/24/2013 1122   BILITOT 0.3 03/24/2013 0530   GFRNONAA >60 10/27/2013 0532   GFRNONAA 68* 07/30/2013 1058   GFRAA >60 10/27/2013 0532   GFRAA 78* 07/30/2013 1058    Assessment and Plan :  1. Midline low back pain without sciatica   2. Idiopathic Parkinson's disease  - carbidopa-levodopa (SINEMET IR) 25-100 MG per tablet; Take 1 tablet by mouth 3 (three) times daily.  Dispense: 90 tablet; Refill: 12 Discussed this with Dr. Manuella Ghazi and he agrees. 3. CAD without angina All risk factors treated Patient was seen and examined by Dr. Miguel Aschoff, and noted scribed by Webb Laws, Radisson MD [facility] 03/08/2015 9:47 AM

## 2015-03-14 ENCOUNTER — Ambulatory Visit (INDEPENDENT_AMBULATORY_CARE_PROVIDER_SITE_OTHER): Payer: Medicare Other | Admitting: Family Medicine

## 2015-03-14 VITALS — BP 130/80 | HR 80 | Temp 98.0°F | Resp 16

## 2015-03-14 DIAGNOSIS — G3109 Other frontotemporal dementia: Secondary | ICD-10-CM

## 2015-03-14 DIAGNOSIS — R0602 Shortness of breath: Secondary | ICD-10-CM

## 2015-03-14 DIAGNOSIS — G20 Parkinson's disease: Secondary | ICD-10-CM

## 2015-03-14 DIAGNOSIS — F0280 Dementia in other diseases classified elsewhere without behavioral disturbance: Secondary | ICD-10-CM

## 2015-03-14 NOTE — Progress Notes (Signed)
Patient ID: [name] [name], male   DOB: 02/09/1941, 74 y.o.   MRN: [id number]    Subjective:  HPI Pt wife and daughter accompany pt today for a 1 week f/u. We started Sinemet and discussed this with Dr. Manuella Ghazi. Pt has fallen a couple of times over the last week. He is forgetting where he left things and has voided on himself. Pt wife also reports that she is now having to fix his plate for him because he is not strong enough to take the plate from the stove to the table. Wife reports that she is dressing and undressing him. His leg are still very weak. He is taking the Sinemet and has not notice any side effects but pt wife does not feel like he is any better, thinks he may be worse. Pt has an appt next week but would like to get an appointment with another doctor.  He is not having any chest pain or worsening dyspnea on exertion. He has having some occasional urinary incontinence. He is using a Rollator to ambulate but he is getting weaker and has gotten weaker since his last visit.  Prior to Admission medications   Medication Sig Start Date End Date Taking? Authorizing Provider  acetaminophen (TYLENOL) 325 MG tablet Take 650 mg by mouth every 6 (six) hours as needed for pain.   Yes Historical Provider, MD  adalimumab (HUMIRA PEN) 40 MG/0.8ML injection Inject 40 mg into the skin every 21 ( twenty-one) days. Tuesday   Yes Historical Provider, MD  ALPRAZolam Duanne Moron) 0.5 MG tablet Take 0.5 mg by mouth at bedtime as needed for sleep.   Yes Historical Provider, MD  amiodarone (PACERONE) 200 MG tablet Take 0.5-1 tablets (100-200 mg total) by mouth daily. 200 mg daily alternating with 100 mg 07/04/14  Yes Minna Merritts, MD  aspirin 81 MG tablet Take 81 mg by mouth daily.     Yes Historical Provider, MD  BREO ELLIPTA 100-25 MCG/INH AEPB INHALE 1 PUFF DAILY 01/18/15  Yes Jerrol Banana., MD  carbidopa-levodopa (SINEMET IR) 25-100 MG per tablet Take 1 tablet by mouth 3 (three) times daily. 03/08/15  Yes  Richard Maceo Pro., MD  carvedilol (COREG) 3.125 MG tablet TAKE 1 TABLET (3.125 MG TOTAL) BY MOUTH 2 (TWO) TIMES DAILY. 07/04/14  Yes Minna Merritts, MD  dimenhyDRINATE (DRAMAMINE) 50 MG tablet Take 50 mg by mouth every 8 (eight) hours as needed (for dizziness).    Yes Historical Provider, MD  diphenhydrAMINE (BENADRYL) 25 mg capsule Take 25 mg by mouth See admin instructions. Take 25 mg 2 hours prior to Humira   Yes Historical Provider, MD  enoxaparin (LOVENOX) 80 MG/0.8ML injection Inject 0.8 mLs (80 mg total) into the skin every 12 (twelve) hours. 01/18/15  Yes Minna Merritts, MD  ezetimibe (ZETIA) 10 MG tablet Take 1 tablet (10 mg total) by mouth daily. Patient taking differently: Take 10 mg by mouth at bedtime.  07/04/14  Yes Minna Merritts, MD  fluticasone (FLONASE) 50 MCG/ACT nasal spray Place 2 sprays into the nose daily as needed for allergies.    Yes Historical Provider, MD  folic acid (FOLVITE) 1 MG tablet Take 1 tablet by mouth daily.  10/20/10  Yes Historical Provider, MD  hydrocortisone (ANUSOL-HC) 25 MG suppository Place 1 suppository rectally at bedtime as needed. Hemorid 11/30/14  Yes Historical Provider, MD  levothyroxine (SYNTHROID, LEVOTHROID) 50 MCG tablet Take 50 mcg by mouth daily before breakfast.   Yes Historical  Provider, MD  losartan (COZAAR) 100 MG tablet TAKE 1 TABLET BY MOUTH EVERY DAY 09/26/14  Yes Minna Merritts, MD  methocarbamol (ROBAXIN) 500 MG tablet Take 1 tablet (500 mg total) by mouth 4 (four) times daily. 03/03/15  Yes Dennis E Chrismon, PA  Multiple Vitamin (MULTIVITAMIN) tablet Take 1 tablet by mouth daily.     Yes Historical Provider, MD  nitroGLYCERIN (NITROSTAT) 0.4 MG SL tablet Take 1 tablet (0.4 mg total) by mouth as directed. 06/09/13  Yes Minna Merritts, MD  omeprazole (PRILOSEC) 40 MG capsule TAKE 1 CAPSULE BY MOUTH DAILY. 12/26/14  Yes Evans Lance, MD  polyethylene glycol-electrolytes (NULYTELY/GOLYTELY) 420 G solution Take 4,000 mLs by  mouth once. 10/25/14  Yes Historical Provider, MD  predniSONE (DELTASONE) 20 MG tablet Take 20 mg by mouth 3 (three) times daily as needed (Start taking 2 days before humira and day of injection).  11/30/13  Yes Historical Provider, MD  PROAIR HFA 108 (90 BASE) MCG/ACT inhaler Inhale 2 puffs into the lungs every 6 (six) hours as needed for wheezing or shortness of breath.  03/14/14  Yes Historical Provider, MD  ranitidine (ZANTAC) 150 MG tablet Take 150 mg by mouth See admin instructions. Take 2 hrs before Humira   Yes Historical Provider, MD  warfarin (COUMADIN) 5 MG tablet TAKE 1 TABLET DAILY BY MOUTH AS DIRECTED. Patient taking differently: TAKE 1 TABLET on Wednesday and Saturday. Take half tablet (2.5mg ) on Monday, Tuesday, Thursday, Friday, & Sunday 11/14/14  Yes Carlena Bjornstad, MD    Patient Active Problem List   Diagnosis Date Noted  . Acquired hypothyroidism 03/08/2015  . Absolute anemia 03/08/2015  . Anxiety 03/08/2015  . Airway hyperreactivity 03/08/2015  . CAD in native artery 03/08/2015  . CCF (congestive cardiac failure) 03/08/2015  . Neoplasm of digestive system 03/08/2015  . Regional enteritis 03/08/2015  . Deep vein thrombosis 03/08/2015  . Dementia of frontal lobe type 03/08/2015  . Elevated diaphragm 03/08/2015  . Acid reflux 03/08/2015  . Borderline diabetes 03/08/2015  . H/O deep venous thrombosis 03/08/2015  . Hypercholesteremia 03/08/2015  . Adiposity 03/08/2015  . Obstructive apnea 03/08/2015  . Idiopathic Parkinson's disease 03/08/2015  . 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 Kidney failure 03/08/2015  . Dysautonomia orthostatic hypotension syndrome 03/08/2015  . Irritable colon 03/08/2015  . [place] (subconjunctival hemorrhage) 03/08/2015  . Temporary cerebral vascular dysfunction 03/08/2015  . Diabetes mellitus, type 2 03/08/2015  . Non-fluent aphasia 06/27/2014  . Hyperlipidemia 06/06/2014  . Atherosclerosis of coronary artery 04/26/2014  .  Arterial disease 04/26/2014  . Essential (primary) hypertension 04/26/2014  . Disorder of mitral valve 04/26/2014  . Other pulmonary embolism and infarction 04/26/2014  . Postsurgical aortocoronary bypass status 04/26/2014  . Hyperlipemia 12/07/2013  . Encounter for therapeutic drug monitoring 09/22/2013  . Bradycardia   . Mechanical complication of other vascular device, implant, and graft 07/30/2013  . Pacemaker 07/13/2013  . Groin hematoma 05/10/2013  . Poor memory 10/28/2012  . Amnesia 10/28/2012  . Cough   . Tremor   . Atrial fibrillation   . Carotid artery disease   . Hx of amiodarone therapy 12/06/2011  . 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 Syncope and collapse   . Hypertension   . Coronary artery disease   . Mitral regurgitation   . Pulmonary embolism   . Orthostasis   . Drug therapy   . S/P IVC filter   . Warfarin anticoagulation   . Factor VIII   .  Hx of CABG   . Incomplete RBBB   . Drug therapy   . Shortness of breath 10/25/2010  . Crohn's disease 01/25/2009    Past Medical History  Diagnosis Date  . Syncope and collapse     Evaluation by Dr Caryl Comes; Syncope probably neutrally mediated and modest resting sinus bradycardia. and 16 B. episode of SVT that was either A fib or another type of SVT. Patient improved with combination of holding ACE inhibitor or low BP and reducing beta blocker for bradycardia  . Hypertension                                                                                                                                                                                                                                                                                                                                                                                                                       . Coronary artery disease     a. CABG 2008. b. Relook cath 08/12/08 - all 5 grafts are patent  . Crohn's disease     Humara Rx in past  . SVT  (supraventricular tachycardia)   . 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 History of benign colon tumor   . Mitral regurgitation     mild - echo 4/09  . Anxiety   . Gait difficulty     with Crestor (but tolerates simva)  . Pulmonary embolism     2007,b CT scan, IVC fiter placed  then because of concern about coumadin use at that time.  . Orthostasis     treated  . Drug therapy     Intermittant steroids   . Warfarin anticoagulation   . Factor VIII     Factor VIII EXCESS.Marland KitchenMarland KitchenDr Gwenlyn Fudge.Marland Kitchenanother reason for coumadin  . Incomplete RBBB   . Drug therapy     question rash diltiazem, and amio  . Ejection fraction     EF 60-65%, echo, March, 2012, moderate diastolic dysfunction  . 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 Melvyn Novas; has chronic shortness of breath  . PAF (paroxysmal atrial fibrillation)     April, 2013, new diagnosis  . Bradycardia     a. s/p Biotronic pacemaker placement 04/02/13. b. Lead removal/new lead insertion 07/2013 due to dysfunction.  . Carotid artery disease     Dopplers to be checked, June, 2013  . Cough     October, 2013  . Tremor     October, 2013  . High cholesterol   . OSA (obstructive sleep apnea)     "quit wearing his mask" (07/30/2013)  . GERD (gastroesophageal reflux disease)     History   Social History  . Marital Status: Married    Spouse Name: N/A  . Number of Children: N/A  . Years of Education: N/A   Occupational History  . retired    Social History Main Topics  . Smoking status: Former Smoker -- 0.25 packs/day for 5 years    Types: Cigarettes  . Smokeless tobacco: Never Used     Comment: 07/30/2013 "stopped smoking ~ 1963"  . Alcohol Use: No  . Drug Use: No  . Sexual Activity: Not Currently   Other Topics Concern  . Not on file   Social History Narrative    Allergies  Allergen Reactions  . Codeine Nausea Only  . Diltiazem Hcl Itching and Rash  . Doxycycline Itching and Rash  . Infliximab Itching and Rash  . Mercaptopurine  Itching and Rash  . Mesalamine Itching and Rash  . Methotrexate Itching and Rash  . Metronidazole Itching and Rash  . Pyridostigmine Bromide Nausea Only    Review of Systems  Constitutional: Positive for malaise/fatigue.  HENT: Negative.   Eyes: Negative.   Respiratory: Negative.   Cardiovascular: Negative.   Gastrointestinal: Negative.   Genitourinary: Negative.   Musculoskeletal: Positive for falls.  Skin: Negative.   Neurological: Positive for tremors and weakness.  Endo/Heme/Allergies: Negative.   Psychiatric/Behavioral: Negative.     Immunization History  Administered Date(s) Administered  . Influenza-Unspecified 05/12/2013   Objective:  BP 130/80 mmHg  Pulse 80  Temp(Src) 98 F (36.7 C) (Oral)  Resp 16  Physical Exam  Constitutional: He is oriented to person, place, and time and well-developed, well-nourished, and in no distress.  HENT:  Head: Normocephalic and atraumatic.  Right Ear: External ear normal.  Left Ear: External ear normal.  Nose: Nose normal.  Neck: Normal range of motion. Neck supple.  Cardiovascular: Normal rate, regular rhythm, normal heart sounds and intact distal pulses.   Pulmonary/Chest: Effort normal and breath sounds normal.  Musculoskeletal: Normal range of motion.  Neurological: He is alert and oriented to person, place, and time.  Pt is using walker today. Wide gait.  Skin: Skin is warm and dry.  Psychiatric: Mood, affect and judgment normal.   neurologic exam. Cogwheeling present in both arms. He has Parkinson's stigmata of with masked facies. He has a shuffling gait but also a wide-based gait today.  He is having significant balance issues at this time. Cranial nerves appear to be intact.  Lab Results  Component Value Date   WBC 8.5 10/25/2013   HGB 13.4 10/25/2013   HCT 39.3* 10/25/2013   PLT 221 10/25/2013   GLUCOSE 86 10/27/2013   CHOL 121 03/21/2013   TRIG 66 03/21/2013   HDL 49 03/21/2013   LDLCALC 59 03/21/2013   TSH  2.717 04/02/2013   INR 2.4 02/22/2015    CMP     Component Value Date/Time   NA 136 10/27/2013 0532   NA 134* 07/30/2013 1058   NA 132* 10/28/2012 1501   K 3.6 10/27/2013 0532   K 4.5 07/30/2013 1058   CL 106 10/27/2013 0532   CL 99 07/30/2013 1058   CO2 25 10/27/2013 0532   CO2 26 07/30/2013 1058   GLUCOSE 86 10/27/2013 0532   GLUCOSE 90 07/30/2013 1058   GLUCOSE 105* 10/28/2012 1501   BUN 14 10/27/2013 0532   BUN 15 07/30/2013 1058   BUN 15 10/28/2012 1501   CREATININE 1.03 10/27/2013 0532   CREATININE 1.06 07/30/2013 1058   CALCIUM 8.1* 10/27/2013 0532   CALCIUM 8.8 07/30/2013 1058   PROT 7.4 10/24/2013 1122   PROT 5.7* 03/24/2013 0530   ALBUMIN 3.6 10/24/2013 1122   ALBUMIN 3.0* 03/24/2013 0530   AST 113* 10/24/2013 1122   AST 98* 03/24/2013 0530   ALT 146* 10/24/2013 1122   ALT 162* 03/24/2013 0530   ALKPHOS 95 10/24/2013 1122   ALKPHOS 157* 03/24/2013 0530   BILITOT 1.3* 10/24/2013 1122   BILITOT 0.3 03/24/2013 0530   GFRNONAA >60 10/27/2013 0532   GFRNONAA 68* 07/30/2013 1058   GFRAA >60 10/27/2013 0532   GFRAA 78* 07/30/2013 1058    Assessment and Plan :  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 He seems of actually done worse taking a low-dose Sinemet. We'll stop that and see how he does between now its appointment next week with neurology. At wife request will also refer to a movement disorder specialist. I think all most all of his symptoms at this point in time her are from his neurologic disorder. We'll also offer home health referral as the wife is his sole caregiver and he is a big man. He has great difficulty moving at this time. Patient also have spinal stenosis or some other issue that it affects his mobility. CAD/status post CABG/without angina-status post 5 vessel CABG 2008 This is appears to be stable and noncontributory today. Atrial fibrillation Pacemaker present History of Crohn's disease/status post  partial colectomy  History of pulmonary embolus  I have done the exam and reviewed the above chart and it is accurate to the best of my knowledge.  Miguel Aschoff MD [facility] 03/14/2015 4:24 PM

## 2015-03-15 ENCOUNTER — Encounter: Payer: Self-pay | Admitting: Family Medicine

## 2015-03-21 NOTE — Telephone Encounter (Signed)
Pt's daughter Jed Limerick states that you had ordered home health for pt at last office visit. Call back # [phone] Please add order to Epic,Thanks

## 2015-03-21 NOTE — Telephone Encounter (Signed)
done

## 2015-03-22 ENCOUNTER — Encounter: Payer: Self-pay | Admitting: Family Medicine

## 2015-03-23 ENCOUNTER — Telehealth: Payer: Self-pay

## 2015-03-23 NOTE — Telephone Encounter (Signed)
ok 

## 2015-03-23 NOTE — Telephone Encounter (Signed)
FYI  [facility] called and states with patient insurance they can not offer nursing services right now but can do physical therapy. She states that if PT gets in there and sees patient really needs a nursing assistance they will orde it as soon as possible. At this time they do not have the staff to do this per what she says, not a permanent issue as far as she knows-aa

## 2015-03-29 ENCOUNTER — Ambulatory Visit: Payer: Medicare Other | Admitting: Family Medicine

## 2015-04-03 ENCOUNTER — Telehealth: Payer: Self-pay | Admitting: Family Medicine

## 2015-04-03 NOTE — Telephone Encounter (Signed)
Sorry [employer].     Jeani Hawking said you could either call [phone] or direct [phone]  Sorry I had that info in but I accidentally hit cancel and lost the info. :{

## 2015-04-03 NOTE — Telephone Encounter (Signed)
We need a phone number to where to call back please.=-aa

## 2015-04-03 NOTE — Telephone Encounter (Signed)
[name] [name] called needing an verbals orders to extend PT for two more weeks.and an order for OT and ADL training.    Thanks  Con Memos

## 2015-04-05 ENCOUNTER — Ambulatory Visit (INDEPENDENT_AMBULATORY_CARE_PROVIDER_SITE_OTHER): Payer: Medicare Other | Admitting: Pharmacist

## 2015-04-05 DIAGNOSIS — Z5181 Encounter for therapeutic drug level monitoring: Secondary | ICD-10-CM

## 2015-04-05 DIAGNOSIS — Z7901 Long term (current) use of anticoagulants: Secondary | ICD-10-CM | POA: Diagnosis not present

## 2015-04-05 DIAGNOSIS — D66 Hereditary factor VIII deficiency: Secondary | ICD-10-CM | POA: Diagnosis not present

## 2015-04-05 DIAGNOSIS — I2699 Other pulmonary embolism without acute cor pulmonale: Secondary | ICD-10-CM | POA: Diagnosis not present

## 2015-04-05 LAB — POCT INR: INR: 1.8

## 2015-04-06 ENCOUNTER — Other Ambulatory Visit: Payer: Self-pay | Admitting: Neurology

## 2015-04-06 DIAGNOSIS — R4701 Aphasia: Secondary | ICD-10-CM

## 2015-04-06 NOTE — Telephone Encounter (Signed)
LMTCB

## 2015-04-06 NOTE — Telephone Encounter (Signed)
I spoke with [name] [name], her best contact number is [phone]  Thanks,   -Mickel Baas

## 2015-04-06 NOTE — Telephone Encounter (Signed)
Okay to extend for 2 more weeks. Longer if needed.

## 2015-04-10 ENCOUNTER — Ambulatory Visit
Admission: RE | Admit: 2015-04-10 | Discharge: 2015-04-10 | Disposition: A | Discharge status: Home / Self Care | Payer: Medicare Other | Source: Ambulatory Visit | Attending: Neurology | Admitting: Neurology

## 2015-04-10 ENCOUNTER — Telehealth: Payer: Self-pay | Admitting: Family Medicine

## 2015-04-10 DIAGNOSIS — M6281 Muscle weakness (generalized): Secondary | ICD-10-CM | POA: Diagnosis present

## 2015-04-10 DIAGNOSIS — R4701 Aphasia: Secondary | ICD-10-CM | POA: Diagnosis not present

## 2015-04-10 NOTE — Telephone Encounter (Signed)
Okay to order?

## 2015-04-10 NOTE — Telephone Encounter (Signed)
See below please-aa 

## 2015-04-10 NOTE — Telephone Encounter (Signed)
[name] [name]

## 2015-04-10 NOTE — Telephone Encounter (Signed)
Esther with [facility] is request a verbal order for speech evaluation due to wife states pt is getting choked with food.  CB#[phone]/MWchoked with food/MW

## 2015-04-13 DIAGNOSIS — C269 Malignant neoplasm of ill-defined sites within the digestive system: Secondary | ICD-10-CM | POA: Diagnosis not present

## 2015-04-13 DIAGNOSIS — G20 Parkinson's disease: Secondary | ICD-10-CM | POA: Diagnosis not present

## 2015-04-13 DIAGNOSIS — I2510 Atherosclerotic heart disease of native coronary artery without angina pectoris: Secondary | ICD-10-CM | POA: Diagnosis not present

## 2015-04-13 DIAGNOSIS — I509 Heart failure, unspecified: Secondary | ICD-10-CM | POA: Diagnosis not present

## 2015-04-26 ENCOUNTER — Ambulatory Visit (INDEPENDENT_AMBULATORY_CARE_PROVIDER_SITE_OTHER): Payer: Medicare Other | Admitting: *Deleted

## 2015-04-26 DIAGNOSIS — I4891 Unspecified atrial fibrillation: Secondary | ICD-10-CM

## 2015-04-26 NOTE — Progress Notes (Signed)
Remote pacemaker transmission.   

## 2015-05-01 DIAGNOSIS — D849 Immunodeficiency, unspecified: Secondary | ICD-10-CM | POA: Insufficient documentation

## 2015-05-01 DIAGNOSIS — D899 Disorder involving the immune mechanism, unspecified: Secondary | ICD-10-CM

## 2015-05-04 LAB — CUP PACEART REMOTE DEVICE CHECK
Brady Statistic RV Percent Paced: 0 %
Lead Channel Impedance Value: 507 Ohm
Lead Channel Pacing Threshold Amplitude: 0.9 V
Lead Channel Pacing Threshold Amplitude: 1 V
Lead Channel Pacing Threshold Pulse Width: 0.4 ms
Lead Channel Sensing Intrinsic Amplitude: 3.6 mV
Lead Channel Setting Pacing Amplitude: 1.4 V
MDC IDC MSMT LEADCHNL RA IMPEDANCE VALUE: 488 Ohm
MDC IDC MSMT LEADCHNL RV PACING THRESHOLD PULSEWIDTH: 0.4 ms
MDC IDC MSMT LEADCHNL RV SENSING INTR AMPL: 9.9 mV
MDC IDC SESS DTM: 20160922165910
MDC IDC SET LEADCHNL RA PACING AMPLITUDE: 1.9 V
MDC IDC SET LEADCHNL RV PACING PULSEWIDTH: 0.4 ms
MDC IDC STAT BRADY RA PERCENT PACED: 91 %
Pulse Gen Model: 359529
Pulse Gen Serial Number: 68066723

## 2015-05-15 ENCOUNTER — Encounter
Admission: RE | Admit: 2015-05-15 | Discharge: 2015-05-15 | Disposition: A | Discharge status: Home / Self Care | Payer: Medicare Other | Source: Ambulatory Visit | Attending: Internal Medicine | Admitting: Internal Medicine

## 2015-05-15 DIAGNOSIS — I4891 Unspecified atrial fibrillation: Secondary | ICD-10-CM | POA: Insufficient documentation

## 2015-05-15 DIAGNOSIS — E871 Hypo-osmolality and hyponatremia: Secondary | ICD-10-CM | POA: Insufficient documentation

## 2015-05-16 ENCOUNTER — Ambulatory Visit: Payer: Medicare Other | Admitting: Family Medicine

## 2015-05-17 ENCOUNTER — Telehealth: Payer: Self-pay

## 2015-05-17 NOTE — Telephone Encounter (Signed)
PT is overdue for Coumadin follow-up, called spoke with pt's wife.  She states pt has been in [facility] x 17 days, discharged on 05/15/15 to [facility] for rehab.  Has been having his coumadin managed and monitored elsewhere. Advised pt's wife when discharged from rehab to call to set up appt in [facility] to resume following pt's Coumadin.  Pt's wife verbalized understanding.

## 2015-05-18 DIAGNOSIS — E871 Hypo-osmolality and hyponatremia: Secondary | ICD-10-CM | POA: Diagnosis not present

## 2015-05-18 DIAGNOSIS — R350 Frequency of micturition: Secondary | ICD-10-CM | POA: Diagnosis present

## 2015-05-18 DIAGNOSIS — I4891 Unspecified atrial fibrillation: Secondary | ICD-10-CM | POA: Diagnosis not present

## 2015-05-18 LAB — BASIC METABOLIC PANEL
Anion gap: 10 (ref 5–15)
BUN: 16 mg/dL (ref 6–20)
CALCIUM: 9 mg/dL (ref 8.9–10.3)
CO2: 25 mmol/L (ref 22–32)
Chloride: 96 mmol/L — ABNORMAL LOW (ref 101–111)
Creatinine, Ser: 1.06 mg/dL (ref 0.61–1.24)
GLUCOSE: 138 mg/dL — AB (ref 65–99)
Potassium: 4 mmol/L (ref 3.5–5.1)
Sodium: 131 mmol/L — ABNORMAL LOW (ref 135–145)

## 2015-05-18 LAB — PROTIME-INR
INR: 2
Prothrombin Time: 22.8 seconds — ABNORMAL HIGH (ref 11.4–15.0)

## 2015-05-19 ENCOUNTER — Encounter: Payer: Self-pay | Admitting: Cardiology

## 2015-05-23 DIAGNOSIS — I4891 Unspecified atrial fibrillation: Secondary | ICD-10-CM | POA: Diagnosis not present

## 2015-05-23 LAB — BASIC METABOLIC PANEL
Anion gap: 6 (ref 5–15)
BUN: 22 mg/dL — ABNORMAL HIGH (ref 6–20)
CALCIUM: 8.4 mg/dL — AB (ref 8.9–10.3)
CO2: 28 mmol/L (ref 22–32)
Chloride: 99 mmol/L — ABNORMAL LOW (ref 101–111)
Creatinine, Ser: 1.11 mg/dL (ref 0.61–1.24)
GFR calc Af Amer: >60 mL/min (ref >60)
GLUCOSE: 73 mg/dL (ref 65–99)
POTASSIUM: 3.7 mmol/L (ref 3.5–5.1)
SODIUM: 133 mmol/L — AB (ref 135–145)

## 2015-05-23 LAB — PROTIME-INR
INR: 2.5
PROTHROMBIN TIME: 27.1 s — AB (ref 11.4–15.0)

## 2015-05-30 ENCOUNTER — Encounter: Payer: Self-pay | Admitting: Internal Medicine

## 2015-05-30 DIAGNOSIS — I4891 Unspecified atrial fibrillation: Secondary | ICD-10-CM | POA: Diagnosis not present

## 2015-05-30 LAB — BASIC METABOLIC PANEL
ANION GAP: 7 (ref 5–15)
BUN: 17 mg/dL (ref 6–20)
CO2: 29 mmol/L (ref 22–32)
Calcium: 9 mg/dL (ref 8.9–10.3)
Chloride: 99 mmol/L — ABNORMAL LOW (ref 101–111)
Creatinine, Ser: 1 mg/dL (ref 0.61–1.24)
GFR calc Af Amer: >60 mL/min (ref >60)
Glucose, Bld: 95 mg/dL (ref 65–99)
Potassium: 3.9 mmol/L (ref 3.5–5.1)
Sodium: 135 mmol/L (ref 135–145)

## 2015-05-30 LAB — PROTIME-INR
INR: 2.81
Prothrombin Time: 29.7 seconds — ABNORMAL HIGH (ref 11.4–15.0)

## 2015-06-02 ENCOUNTER — Encounter: Payer: Self-pay | Admitting: Cardiology

## 2015-06-04 DIAGNOSIS — I4891 Unspecified atrial fibrillation: Secondary | ICD-10-CM | POA: Diagnosis not present

## 2015-06-04 LAB — URINALYSIS COMPLETE WITH MICROSCOPIC (ARMC ONLY)
BILIRUBIN URINE: NEGATIVE
GLUCOSE, UA: NEGATIVE mg/dL
KETONES UR: NEGATIVE mg/dL
Leukocytes, UA: NEGATIVE
NITRITE: NEGATIVE
Protein, ur: NEGATIVE mg/dL
Specific Gravity, Urine: 1.005 (ref 1.005–1.030)
pH: 7 (ref 5.0–8.0)

## 2015-06-05 ENCOUNTER — Encounter: Payer: Self-pay | Admitting: Cardiovascular Disease

## 2015-06-05 ENCOUNTER — Ambulatory Visit (INDEPENDENT_AMBULATORY_CARE_PROVIDER_SITE_OTHER): Payer: Medicare Other | Admitting: Cardiovascular Disease

## 2015-06-05 VITALS — BP 120/62 | HR 67 | Ht 70.0 in | Wt 203.4 lb

## 2015-06-05 DIAGNOSIS — A419 Sepsis, unspecified organism: Secondary | ICD-10-CM | POA: Diagnosis not present

## 2015-06-05 DIAGNOSIS — Z23 Encounter for immunization: Secondary | ICD-10-CM

## 2015-06-05 DIAGNOSIS — I2510 Atherosclerotic heart disease of native coronary artery without angina pectoris: Secondary | ICD-10-CM

## 2015-06-05 DIAGNOSIS — G903 Multi-system degeneration of the autonomic nervous system: Secondary | ICD-10-CM | POA: Diagnosis not present

## 2015-06-05 DIAGNOSIS — N390 Urinary tract infection, site not specified: Secondary | ICD-10-CM

## 2015-06-05 DIAGNOSIS — I4891 Unspecified atrial fibrillation: Secondary | ICD-10-CM

## 2015-06-05 DIAGNOSIS — E1159 Type 2 diabetes mellitus with other circulatory complications: Secondary | ICD-10-CM

## 2015-06-05 DIAGNOSIS — E785 Hyperlipidemia, unspecified: Secondary | ICD-10-CM

## 2015-06-05 DIAGNOSIS — I951 Orthostatic hypotension: Secondary | ICD-10-CM

## 2015-06-05 NOTE — Assessment & Plan Note (Signed)
We have encouraged continued careful diet management in an effort to lose weight.  

## 2015-06-05 NOTE — Assessment & Plan Note (Signed)
Long hospital course at [facility] With post sepsis complication Now with possible positive UTI. Recommended the wife talk with Dr. Frazier Richards who will follow-up on the results Rehabilitation is limiting free water secondary to low sodium

## 2015-06-05 NOTE — Assessment & Plan Note (Signed)
Maintaining normal sinus rhythm. No changes to his medications. 

## 2015-06-05 NOTE — Patient Instructions (Signed)
You are doing well. No medication changes were made.  Please call us if you have new issues that need to be addressed before your next appt.  Your physician wants you to follow-up in: 6 months.  You will receive a reminder letter in the mail two months in advance. If you don't receive a letter, please call our office to schedule the follow-up appointment.   

## 2015-06-05 NOTE — Progress Notes (Signed)
Patient ID: [name] [name], male    DOB: 1940/10/27, 74 y.o.   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 workup August 2014 for continued episodes of syncope found to have sick sinus syndrome, loop monitor placed with continued episodes of syncope, pacemaker placed at [facility] August 2014. H/o  atrial lead dislodgement and was also found to have reduce R waves. He has undergone PPM lead revisions.   admission to the hospital 10/24/2013 with food poisoning and an ileus, renal failure. He presents today for follow-up of his coronary artery disease  [name] [name] presents with his wife. Recent urinary tract infection with sepsis, spent 17 days at [facility]. By her account, he had significant IV fluids for sustentation, ABX. Then had respiratory difficulty, low sodium Has been 20 days at [facility] rehabilitation Legs are still weak. He is scheduled to go home in 4 days' time. She is uncertain how she is going to manage as he is still weak. He denies any significant lower extremity edema  Wife reports his urine is dark, strong odor again concerning for infection UA and culture sent recently showing bacteria, cultures positive for gram-negative bacteria  EKG on today's visit shows normal sinus rhythm with rate 68 bpm, no significant ST or T-wave changes  Other past medical history Prior history of falls History of confusion on prior hospital visits . shuffled gait, problems with speech, tremor. He has seen neurology. MRI showed atrophy Carotid ultrasound June 2013 showing mild carotid arterial disease  Total cholesterol 183, LDL 109, HDL 61 in January 2015  cardiac catheterization 03/23/2013 showing patent  grafts x5, LIMA to the LAD, vein graft to the diagonal, vein graft to the RCA, vein graft to the OM1 jump graft to the OM 2, ejection fraction 50%  Previous H/o atrial fibrillation on amiodarone, h/o  neurocardiogenic syncope,  MRI that showed no acute abnormality. some bradycardia.  on amiodarone, weaned off metoprolol Previous episodes of syncope in the past felt to be neurocardiogenic.  episode of syncope  at a restaurant  waiting in line for food. He had nausea, lightheadedness, then had syncope. He is on the ground for several minutes before he came to. Workup in the hospital showed sodium of 122, normal echocardiogram, normal telemetry with normal sinus rhythm. It was felt that he could be orthostatic from the start HCTZ earlier that week with a change in his sodium.    Myoview was performed that showed no ischemia. 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 Codeine Nausea Only  . Diltiazem Hcl Itching and Rash  . Doxycycline Itching and Rash  . Infliximab Itching and Rash  . Mercaptopurine Itching and Rash  . Mesalamine Itching and Rash  . Methotrexate Itching and Rash  . Metronidazole Itching and Rash  . Pyridostigmine Bromide Nausea Only    Outpatient Encounter Prescriptions as of 06/05/2015  Medication Sig  . acetaminophen (TYLENOL) 325 MG tablet Take 650 mg by mouth every 6 (six) hours as needed for pain.  Marland Kitchen adalimumab (HUMIRA PEN)  40 MG/0.8ML injection Inject 40 mg into the skin every 21 ( twenty-one) days. Tuesday  . amiodarone (PACERONE) 200 MG tablet Take 200 mg by mouth daily.  Marland Kitchen aspirin 81 MG tablet Take 81 mg by mouth daily.    Marland Kitchen BREO ELLIPTA 100-25 MCG/INH AEPB INHALE 1 PUFF DAILY  . carvedilol (COREG) 3.125 MG tablet  TAKE 1 TABLET (3.125 MG TOTAL) BY MOUTH 2 (TWO) TIMES DAILY.  . diphenhydrAMINE (BENADRYL) 25 mg capsule Take 25 mg by mouth See admin instructions. Take 25 mg 2 hours prior to Humira  . ezetimibe (ZETIA) 10 MG tablet Take 1 tablet (10 mg total) by mouth daily. (Patient taking differently: Take 10 mg by mouth at bedtime. )  . fluticasone (FLONASE) 50 MCG/ACT nasal spray Place 2 sprays into the nose daily as needed for allergies.   . folic acid (FOLVITE) 1 MG tablet Take 1 tablet by mouth daily.   . furosemide (LASIX) 40 MG tablet Take 40 mg by mouth.  . hydrocortisone (ANUSOL-HC) 25 MG suppository Place 1 suppository rectally at bedtime as needed. Hemorid  . levothyroxine (SYNTHROID, LEVOTHROID) 50 MCG tablet Take 50 mcg by mouth daily before breakfast.  . loratadine (CLARITIN) 10 MG tablet Take 10 mg by mouth daily.  Marland Kitchen losartan (COZAAR) 100 MG tablet TAKE 1 TABLET BY MOUTH EVERY DAY  . Multiple Vitamin (MULTIVITAMIN) tablet Take 1 tablet by mouth daily.    . nitroGLYCERIN (NITROSTAT) 0.4 MG SL tablet Take 1 tablet (0.4 mg total) by mouth as directed.  Marland Kitchen omeprazole (PRILOSEC) 40 MG capsule TAKE 1 CAPSULE BY MOUTH DAILY.  Marland Kitchen polyethylene glycol-electrolytes (NULYTELY/GOLYTELY) 420 G solution Take 4,000 mLs by mouth once.  . predniSONE (DELTASONE) 20 MG tablet Take 20 mg by mouth 3 (three) times daily as needed (Start taking 2 days before humira and day of injection).   Marland Kitchen PROAIR HFA 108 (90 BASE) MCG/ACT inhaler Inhale 2 puffs into the lungs every 6 (six) hours as needed for wheezing or shortness of breath.   . ranitidine (ZANTAC) 150 MG tablet Take 150 mg by mouth See admin instructions. Take 2 hrs before Humira  . sodium chloride 1 G tablet Take 1 g by mouth once.  . warfarin (COUMADIN) 5 MG tablet TAKE 1 TABLET DAILY BY MOUTH AS DIRECTED. (Patient taking differently: TAKE 1 TABLET on Wednesday and Saturday. Take half tablet (2.5mg ) on Monday, Tuesday, Thursday, Friday, & Sunday)  . [DISCONTINUED]  ALPRAZolam (XANAX) 0.5 MG tablet Take 0.5 mg by mouth at bedtime as needed for sleep.  . [DISCONTINUED] amiodarone (PACERONE) 200 MG tablet Take 0.5-1 tablets (100-200 mg total) by mouth daily. 200 mg daily alternating with 100 mg (Patient not taking: Reported on 06/05/2015)  . [DISCONTINUED] carbidopa-levodopa (SINEMET IR) 25-100 MG per tablet Take 1 tablet by mouth 3 (three) times daily. (Patient not taking: Reported on 06/05/2015)  . [DISCONTINUED] dimenhyDRINATE (DRAMAMINE) 50 MG tablet Take 50 mg by mouth every 8 (eight) hours as needed (for dizziness).   . [DISCONTINUED] enoxaparin (LOVENOX) 80 MG/0.8ML injection Inject 0.8 mLs (80 mg total) into the skin every 12 (twelve) hours. (Patient not taking: Reported on 06/05/2015)  . [DISCONTINUED] methocarbamol (ROBAXIN) 500 MG tablet Take 1 tablet (500 mg total) by mouth 4 (four) times daily. (Patient not taking: Reported on 06/05/2015)   No facility-administered encounter medications on file as of 06/05/2015.    Past Medical History  Diagnosis Date  . Syncope and collapse     Evaluation by Dr Caryl Comes; Syncope probably neutrally  mediated and modest resting sinus bradycardia. and 16 B. episode of SVT that was either A fib or another type of SVT. Patient improved with combination of holding ACE inhibitor or low BP and reducing beta blocker for bradycardia  . Hypertension                                                                                                                                                                                                                                                                                                                                                                                                                       . Coronary artery disease     a. CABG 2008. b. Relook cath 08/12/08 - all 5 grafts are patent  . Crohn's disease ([place])     Humara Rx in past  . SVT (supraventricular tachycardia) ([place])    . 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 History of benign colon tumor   . Mitral regurgitation     mild - echo 4/09  . Anxiety   . Gait difficulty     with Crestor (but tolerates simva)  . Pulmonary embolism ([place])     2007,b CT scan, IVC fiter placed then because of concern about coumadin use at that time.  . Orthostasis     treated  . Drug therapy     Intermittant steroids   . Warfarin anticoagulation   . Factor VIII     Factor VIII EXCESS.Marland KitchenMarland KitchenDr Gwenlyn Fudge.Marland Kitchenanother reason  for coumadin  . Incomplete RBBB   . Drug therapy     question rash diltiazem, and amio  . Ejection fraction     EF 60-65%, echo, March, 2012, moderate diastolic dysfunction  . 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 Melvyn Novas; has chronic shortness of breath  . PAF (paroxysmal atrial fibrillation) ([place])     April, 2013, new diagnosis  . Bradycardia     a. s/p Biotronic pacemaker placement 04/02/13. b. Lead removal/new lead insertion 07/2013 due to dysfunction.  . Carotid artery disease ([place])     Dopplers to be checked, June, 2013  . Cough     October, 2013  . Tremor     October, 2013  . High cholesterol   . OSA (obstructive sleep apnea)     "quit wearing his mask" (07/30/2013)  . GERD (gastroesophageal reflux disease)     Past Surgical History  Procedure Laterality Date  . Cholecystectomy    . Back surgery    . Cataract extraction w/ intraocular lens  implant, bilateral Bilateral 1990's  . Colonoscopy w/ polypectomy    . Loop monitor  03/2013    "removed w/in 2 days" (07/30/2013)  . Lead revision  07/30/2013    DDD PM leads removed and a new set of PPM leads inserted via the left subclavian vein. [id number]  . Tonsillectomy    . Insert / replace / remove pacemaker  03/2013    [facility]  . Cardiac catheterization  03/2013    [facility]  . Cardiac catheterization  2008; ~ 2011  . Coronary artery bypass graft  4/08    CABG X5  . Lumbar disc surgery  1978; 1986  . Colon surgery      "had a tumor removed" (07/30/2013)  .  Vena cava filter placement  2007    PE  . Loop recorder implant N/A 03/24/2013    Procedure: LOOP RECORDER IMPLANT;  Surgeon: Deboraha Sprang, MD;  Location: [facility] CATH LAB;  Service: Cardiovascular;  Laterality: N/A;  . Permanent pacemaker insertion N/A 04/02/2013    Procedure: PERMANENT PACEMAKER INSERTION;  Surgeon: Evans Lance, MD;  Location: [facility] CATH LAB;  Service: Cardiovascular;  Laterality: N/A;  . Lead revision N/A 07/30/2013    Procedure: LEAD REVISION;  Surgeon: Evans Lance, MD;  Location: [facility] CATH LAB;  Service: Cardiovascular;  Laterality: N/A;  . Colonoscopy N/A 01/13/2015    Procedure: COLONOSCOPY;  Surgeon: Manya Silvas, MD;  Location: [facility] ENDOSCOPY;  Service: Endoscopy;  Laterality: N/A;    Social History  reports that he has quit smoking. His smoking use included Cigarettes. He has a 1.25 pack-year smoking history. He has never used smokeless tobacco. He reports that he does not drink alcohol or use illicit drugs.  Family History Family history is unknown by patient.   Review of Systems  Constitutional: Negative.   Eyes: Eye itching: this.  Respiratory: Negative.   Cardiovascular: Negative.   Gastrointestinal: Negative.   Skin: Negative.   Neurological: Negative.   Hematological: Negative.   Psychiatric/Behavioral: Negative.        Decreased memory, delay in his speech.  All other systems reviewed and are negative.   BP 120/62 mmHg  Pulse 67  Ht 5\' 10"  (1.778 m)  Wt 203 lb 6 oz (92.25 kg)  BMI 29.18 kg/m2  Physical Exam  Constitutional: He is oriented to person, place, and time. He appears well-developed and well-nourished.  Obese  HENT:  Head: Normocephalic.  Nose:  Nose normal.  Mouth/Throat: Oropharynx is clear and moist.  Eyes: Conjunctivae are normal. Pupils are equal, round, and reactive to light.  Neck: Normal range of motion. Neck supple. No JVD present.  Cardiovascular: Normal rate, regular rhythm, S1 normal, S2 normal, normal heart sounds  and intact distal pulses.  Exam reveals no gallop and no friction rub.   No murmur heard. Pulmonary/Chest: Effort normal and breath sounds normal. No respiratory distress. He has no wheezes. He has no rales. He exhibits no tenderness.  Abdominal: Soft. Bowel sounds are normal. He exhibits no distension. There is no tenderness.  Musculoskeletal: Normal range of motion. He exhibits no edema or tenderness.  Lymphadenopathy:    He has no cervical adenopathy.  Neurological: He is alert and oriented to person, place, and time. Coordination normal.  Skin: Skin is warm and dry. No rash noted. No erythema.  Psychiatric: Judgment and thought content normal. His affect is blunt. He is slowed.  Flat affect    Assessment and Plan   Nursing note and vitals reviewed.

## 2015-06-05 NOTE — Assessment & Plan Note (Signed)
Currently not on a statin Takes other alternatives such as zetia daily No recent lipid panel available through the [facility] system

## 2015-06-05 NOTE — Assessment & Plan Note (Signed)
Currently with no symptoms of angina. No further testing

## 2015-06-05 NOTE — Assessment & Plan Note (Signed)
Blood pressure stable on today's visit. We will need to monitor for symptoms of orthostasis

## 2015-06-06 DIAGNOSIS — I4891 Unspecified atrial fibrillation: Secondary | ICD-10-CM | POA: Diagnosis not present

## 2015-06-06 LAB — BASIC METABOLIC PANEL
ANION GAP: 9 (ref 5–15)
BUN: 15 mg/dL (ref 6–20)
CALCIUM: 8.5 mg/dL — AB (ref 8.9–10.3)
CO2: 26 mmol/L (ref 22–32)
Chloride: 95 mmol/L — ABNORMAL LOW (ref 101–111)
Creatinine, Ser: 0.92 mg/dL (ref 0.61–1.24)
Glucose, Bld: 83 mg/dL (ref 65–99)
Potassium: 4.4 mmol/L (ref 3.5–5.1)
SODIUM: 130 mmol/L — AB (ref 135–145)

## 2015-06-06 LAB — URINE CULTURE

## 2015-06-06 LAB — PROTIME-INR
INR: 2.21
PROTHROMBIN TIME: 24.7 s — AB (ref 11.4–15.0)

## 2015-06-28 ENCOUNTER — Ambulatory Visit (INDEPENDENT_AMBULATORY_CARE_PROVIDER_SITE_OTHER): Payer: Medicare Other

## 2015-06-28 DIAGNOSIS — Z5181 Encounter for therapeutic drug level monitoring: Secondary | ICD-10-CM

## 2015-06-28 DIAGNOSIS — Z7901 Long term (current) use of anticoagulants: Secondary | ICD-10-CM | POA: Diagnosis not present

## 2015-06-28 DIAGNOSIS — I2699 Other pulmonary embolism without acute cor pulmonale: Secondary | ICD-10-CM | POA: Diagnosis not present

## 2015-06-28 DIAGNOSIS — D66 Hereditary factor VIII deficiency: Secondary | ICD-10-CM

## 2015-06-28 LAB — POCT INR: INR: 1.1

## 2015-07-03 ENCOUNTER — Other Ambulatory Visit: Payer: Self-pay | Admitting: Cardiovascular Disease

## 2015-07-05 ENCOUNTER — Encounter: Payer: Self-pay | Admitting: Family Medicine

## 2015-07-05 ENCOUNTER — Ambulatory Visit (INDEPENDENT_AMBULATORY_CARE_PROVIDER_SITE_OTHER): Payer: Medicare Other

## 2015-07-05 ENCOUNTER — Ambulatory Visit (INDEPENDENT_AMBULATORY_CARE_PROVIDER_SITE_OTHER): Payer: Medicare Other | Admitting: Family Medicine

## 2015-07-05 VITALS — BP 96/52 | HR 72 | Temp 98.6°F | Resp 16 | Wt 211.0 lb

## 2015-07-05 DIAGNOSIS — I10 Essential (primary) hypertension: Secondary | ICD-10-CM

## 2015-07-05 DIAGNOSIS — I2699 Other pulmonary embolism without acute cor pulmonale: Secondary | ICD-10-CM

## 2015-07-05 DIAGNOSIS — Z5181 Encounter for therapeutic drug level monitoring: Secondary | ICD-10-CM

## 2015-07-05 DIAGNOSIS — I9589 Other hypotension: Secondary | ICD-10-CM | POA: Diagnosis not present

## 2015-07-05 DIAGNOSIS — Z7901 Long term (current) use of anticoagulants: Secondary | ICD-10-CM

## 2015-07-05 DIAGNOSIS — I2510 Atherosclerotic heart disease of native coronary artery without angina pectoris: Secondary | ICD-10-CM | POA: Diagnosis not present

## 2015-07-05 DIAGNOSIS — G20 Parkinson's disease: Secondary | ICD-10-CM

## 2015-07-05 DIAGNOSIS — G309 Alzheimer's disease, unspecified: Secondary | ICD-10-CM

## 2015-07-05 DIAGNOSIS — D66 Hereditary factor VIII deficiency: Secondary | ICD-10-CM

## 2015-07-05 DIAGNOSIS — R4701 Aphasia: Secondary | ICD-10-CM

## 2015-07-05 DIAGNOSIS — G912 (Idiopathic) normal pressure hydrocephalus: Secondary | ICD-10-CM | POA: Insufficient documentation

## 2015-07-05 DIAGNOSIS — I4891 Unspecified atrial fibrillation: Secondary | ICD-10-CM

## 2015-07-05 LAB — POCT INR: INR: 1.9

## 2015-07-05 NOTE — Progress Notes (Signed)
Patient ID: [name] [name], male   DOB: 23-Mar-1941, 74 y.o.   MRN: [id number]    Subjective:  HPI  Patient is here for follow up. He saw Dr. Manuella Ghazi and he was referred to [facility] for lumbar drain trial-due to cerebrospinal fluid. Had this done on November 7th. He went to rehab after at [facility] and has physical therapy at home right now but then they will be going to [place] for therapy.  Patient is walking better and is able to dress himself after the procedure, his speech is a little better also.  Prior to Admission medications   Medication Sig Start Date End Date Taking? Authorizing Provider  acetaminophen (TYLENOL) 325 MG tablet Take 650 mg by mouth every 6 (six) hours as needed for pain.   Yes Historical Provider, MD  adalimumab (HUMIRA PEN) 40 MG/0.8ML injection Inject 40 mg into the skin every 21 ( twenty-one) days. Tuesday   Yes Historical Provider, MD  amiodarone (PACERONE) 200 MG tablet Take 200 mg by mouth daily.   Yes Historical Provider, MD  aspirin 81 MG tablet Take 81 mg by mouth daily.     Yes Historical Provider, MD  BREO ELLIPTA 100-25 MCG/INH AEPB INHALE 1 PUFF DAILY 01/18/15  Yes Jerrol Banana., MD  carvedilol (COREG) 3.125 MG tablet TAKE 1 TABLET (3.125 MG TOTAL) BY MOUTH 2 (TWO) TIMES DAILY. 07/04/14  Yes Minna Merritts, MD  diphenhydrAMINE (BENADRYL) 25 mg capsule Take 25 mg by mouth See admin instructions. Take 25 mg 2 hours prior to Humira   Yes Historical Provider, MD  docusate sodium (COLACE) 100 MG capsule Take by mouth.   Yes Historical Provider, MD  fluticasone (FLONASE) 50 MCG/ACT nasal spray Place 2 sprays into the nose daily as needed for allergies.    Yes Historical Provider, MD  folic acid (FOLVITE) 1 MG tablet Take 1 tablet by mouth daily.  10/20/10  Yes Historical Provider, MD  furosemide (LASIX) 40 MG tablet Take 40 mg by mouth.   Yes Historical Provider, MD  hydrocortisone (ANUSOL-HC) 25 MG suppository Place 1 suppository rectally at bedtime as needed.  Hemorid 11/30/14  Yes Historical Provider, MD  ipratropium-albuterol (DUONEB) 0.5-2.5 (3) MG/3ML SOLN Inhale into the lungs. 05/15/15 05/09/16 Yes Historical Provider, MD  levothyroxine (SYNTHROID, LEVOTHROID) 50 MCG tablet Take 50 mcg by mouth daily before breakfast.   Yes Historical Provider, MD  loratadine (CLARITIN) 10 MG tablet Take 10 mg by mouth daily.   Yes Historical Provider, MD  losartan (COZAAR) 100 MG tablet TAKE 1 TABLET BY MOUTH EVERY DAY 09/26/14  Yes Minna Merritts, MD  Multiple Vitamin (MULTIVITAMIN) tablet Take 1 tablet by mouth daily.     Yes Historical Provider, MD  nitroGLYCERIN (NITROSTAT) 0.4 MG SL tablet Take 1 tablet (0.4 mg total) by mouth as directed. 06/09/13  Yes Minna Merritts, MD  omeprazole (PRILOSEC) 40 MG capsule TAKE 1 CAPSULE BY MOUTH DAILY. 12/26/14  Yes Evans Lance, MD  polyethylene glycol-electrolytes (NULYTELY/GOLYTELY) 420 G solution Take 4,000 mLs by mouth once. 10/25/14  Yes Historical Provider, MD  predniSONE (DELTASONE) 20 MG tablet Take 20 mg by mouth 3 (three) times daily as needed (Start taking 2 days before humira and day of injection).  11/30/13  Yes Historical Provider, MD  PROAIR HFA 108 (90 BASE) MCG/ACT inhaler Inhale 2 puffs into the lungs every 6 (six) hours as needed for wheezing or shortness of breath.  03/14/14  Yes Historical Provider, MD  ranitidine (ZANTAC) 150  MG tablet Take 150 mg by mouth See admin instructions. Take 2 hrs before Humira   Yes Historical Provider, MD  sodium chloride 1 G tablet Take 1 g by mouth once.   Yes Historical Provider, MD  warfarin (COUMADIN) 5 MG tablet TAKE 1 TABLET DAILY BY MOUTH AS DIRECTED. Patient taking differently: TAKE 1 TABLET on Wednesday and Saturday. Take half tablet (2.5mg ) on Monday, Tuesday, Thursday, Friday, & Sunday 11/14/14  Yes Carlena Bjornstad, MD  ZETIA 10 MG tablet TAKE ONE TABLET EVERY DAY 07/04/15  Yes Minna Merritts, MD    Patient Active Problem List   Diagnosis Date Noted  . Sepsis  secondary to UTI ([place]) 06/05/2015  . Acquired hypothyroidism 03/08/2015  . Absolute anemia 03/08/2015  . Anxiety 03/08/2015  . Airway hyperreactivity 03/08/2015  . CAD in native artery 03/08/2015  . CCF (congestive cardiac failure) ([place]) 03/08/2015  . Neoplasm of digestive system 03/08/2015  . Regional enteritis ([place]) 03/08/2015  . Deep vein thrombosis ([place]) 03/08/2015  . Dementia of frontal lobe type 03/08/2015  . Elevated diaphragm 03/08/2015  . Acid reflux 03/08/2015  . Borderline diabetes 03/08/2015  . H/O deep venous thrombosis 03/08/2015  . Hypercholesteremia 03/08/2015  . Adiposity 03/08/2015  . Obstructive apnea 03/08/2015  . Idiopathic Parkinson's disease ([place]) 03/08/2015  . 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 Kidney failure 03/08/2015  . 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 Irritable colon 03/08/2015  . [place] (subconjunctival hemorrhage) 03/08/2015  . Temporary cerebral vascular dysfunction 03/08/2015  . Diabetes mellitus, type 2 ([place]) 03/08/2015  . Non-fluent aphasia 06/27/2014  . Hyperlipidemia 06/06/2014  . Atherosclerosis of coronary artery 04/26/2014  . Arterial disease ([place]) 04/26/2014  . Essential (primary) hypertension 04/26/2014  . Disorder of mitral valve 04/26/2014  . Other pulmonary embolism and infarction 04/26/2014  . Postsurgical aortocoronary bypass status 04/26/2014  . Hyperlipemia 12/07/2013  . Encounter for therapeutic drug monitoring 09/22/2013  . Bradycardia   . Mechanical complication of other vascular device, implant, and graft 07/30/2013  . Pacemaker 07/13/2013  . Groin hematoma 05/10/2013  . Poor memory 10/28/2012  . Amnesia 10/28/2012  . Cough   . Tremor   . Atrial fibrillation ([place])   . Carotid artery disease ([place])   . Hx of amiodarone therapy 12/06/2011  . 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 Syncope and collapse   . Hypertension   . Coronary artery disease   . Mitral regurgitation     . Pulmonary embolism ([place])   . Orthostasis   . Drug therapy   . S/P IVC filter   . Warfarin anticoagulation   . Factor VIII   . Hx of CABG   . Incomplete RBBB   . Drug therapy   . Shortness of breath 10/25/2010  . Crohn's disease ([place]) 01/25/2009    Past Medical History  Diagnosis Date  . Syncope and collapse     Evaluation by Dr Caryl Comes; Syncope probably neutrally mediated and modest resting sinus bradycardia. and 16 B. episode of SVT that was either A fib or another type of SVT. Patient improved with combination of holding ACE inhibitor or low BP and reducing beta blocker for bradycardia  . Hypertension                                                                                                                                                                                                                                                                                                                                                                                                                       .  Coronary artery disease     a. CABG 2008. b. Relook cath 08/12/08 - all 5 grafts are patent  . Crohn's disease ([place])     Humara Rx in past  . SVT (supraventricular tachycardia) ([place])   . 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 History of benign colon tumor   . Mitral regurgitation     mild - echo 4/09  . Anxiety   . Gait difficulty     with Crestor (but tolerates simva)  . Pulmonary embolism ([place])     2007,b CT scan, IVC fiter placed then because of concern about coumadin use at that time.  . Orthostasis     treated  . Drug therapy     Intermittant steroids   . Warfarin anticoagulation   . Factor VIII     Factor VIII EXCESS.Marland KitchenMarland KitchenDr Gwenlyn Fudge.Marland Kitchenanother reason for coumadin  . Incomplete RBBB   . Drug therapy     question rash diltiazem, and amio  . Ejection fraction     EF 60-65%, echo, March, 2012, moderate diastolic dysfunction  . 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 Melvyn Novas; has  chronic shortness of breath  . PAF (paroxysmal atrial fibrillation) ([place])     April, 2013, new diagnosis  . Bradycardia     a. s/p Biotronic pacemaker placement 04/02/13. b. Lead removal/new lead insertion 07/2013 due to dysfunction.  . Carotid artery disease ([place])     Dopplers to be checked, June, 2013  . Cough     October, 2013  . Tremor     October, 2013  . High cholesterol   . OSA (obstructive sleep apnea)     "quit wearing his mask" (07/30/2013)  . GERD (gastroesophageal reflux disease)     Social History   Social History  . Marital Status: Married    Spouse Name: N/A  . Number of Children: N/A  . Years of Education: N/A   Occupational History  . retired    Social History Main Topics  . Smoking status: Former Smoker -- 0.25 packs/day for 5 years    Types: Cigarettes  . Smokeless tobacco: Never Used     Comment: 07/30/2013 "stopped smoking ~ 1963"  . Alcohol Use: No  . Drug Use: No  . Sexual Activity: Not Currently   Other Topics Concern  . Not on file   Social History Narrative    Allergies  Allergen Reactions  . Codeine Nausea Only  . Diltiazem Hcl Itching and Rash  . Doxycycline Itching and Rash  . Infliximab Itching and Rash  . Mercaptopurine Itching and Rash  . Mesalamine Itching and Rash  . Methotrexate Itching and Rash  . Metronidazole Itching and Rash  . Pyridostigmine Bromide Nausea Only    Review of Systems  HENT: Negative.   Eyes: Negative.   Respiratory: Negative.   Cardiovascular: Negative.   Gastrointestinal: Negative.   Musculoskeletal: Positive for joint pain.  Skin: Negative.   Neurological: Positive for speech change and weakness.  Psychiatric/Behavioral: Negative.     Immunization History  Administered Date(s) Administered  . Influenza,inj,Quad PF,36+ Mos 06/05/2015  . Influenza-Unspecified 05/12/2013   Objective:  BP 96/52 mmHg  Pulse 72  Temp(Src) 98.6 F (37 C)  Resp 16  Wt 211 lb (95.709 kg)  Physical Exam    Constitutional: He is oriented to person, place, and time and well-developed, well-nourished, and in no distress.  HENT:  Head: Normocephalic.  Eyes: Conjunctivae are normal. Pupils are equal,  round, and reactive to light.  Neck: Normal range of motion. Neck supple.  Cardiovascular: Normal rate, regular rhythm, normal heart sounds and intact distal pulses.   No murmur heard. Pulmonary/Chest: Effort normal and breath sounds normal. No respiratory distress. He has no wheezes.  Neurological: He is alert and oriented to person, place, and time.  Psychiatric: Mood and affect normal.    Lab Results  Component Value Date   WBC 8.5 10/25/2013   HGB 13.4 10/25/2013   HCT 39.3* 10/25/2013   PLT 221 10/25/2013   GLUCOSE 83 06/06/2015   CHOL 121 03/21/2013   TRIG 66 03/21/2013   HDL 49 03/21/2013   LDLCALC 59 03/21/2013   TSH 2.717 04/02/2013   INR 1.9 07/05/2015   HGBA1C 6.9* 03/21/2013    CMP     Component Value Date/Time   NA 130* 06/06/2015 0550   NA 136 10/27/2013 0532   NA 132* 10/28/2012 1501   K 4.4 06/06/2015 0550   K 3.6 10/27/2013 0532   CL 95* 06/06/2015 0550   CL 106 10/27/2013 0532   CO2 26 06/06/2015 0550   CO2 25 10/27/2013 0532   GLUCOSE 83 06/06/2015 0550   GLUCOSE 86 10/27/2013 0532   GLUCOSE 105* 10/28/2012 1501   BUN 15 06/06/2015 0550   BUN 14 10/27/2013 0532   BUN 15 10/28/2012 1501   CREATININE 0.92 06/06/2015 0550   CREATININE 1.03 10/27/2013 0532   CALCIUM 8.5* 06/06/2015 0550   CALCIUM 8.1* 10/27/2013 0532   PROT 7.4 10/24/2013 1122   PROT 5.7* 03/24/2013 0530   ALBUMIN 3.6 10/24/2013 1122   ALBUMIN 3.0* 03/24/2013 0530   AST 113* 10/24/2013 1122   AST 98* 03/24/2013 0530   ALT 146* 10/24/2013 1122   ALT 162* 03/24/2013 0530   ALKPHOS 95 10/24/2013 1122   ALKPHOS 157* 03/24/2013 0530   BILITOT 1.3* 10/24/2013 1122   BILITOT 0.3 03/24/2013 0530   GFRNONAA >60 06/06/2015 0550   GFRNONAA >60 10/27/2013 0532   GFRAA >60 06/06/2015 0550    GFRAA >60 10/27/2013 0532    Assessment and Plan :  1. 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 Idiopathic Parkinson's disease ([place])  3. NPH (normal pressure hydrocephalus) This seems to have improved after procedure to lower pressure. 4. Essential (primary) hypertension  5. CAD in native artery  6. Atrial fibrillation, unspecified type ([place])  I have done the exam and reviewed the above chart and it is accurate to the best of my knowledge.   Miguel Aschoff MD [facility] 07/05/2015 1:52 PM

## 2015-07-19 ENCOUNTER — Ambulatory Visit (INDEPENDENT_AMBULATORY_CARE_PROVIDER_SITE_OTHER): Payer: Medicare Other

## 2015-07-19 DIAGNOSIS — D66 Hereditary factor VIII deficiency: Secondary | ICD-10-CM

## 2015-07-19 DIAGNOSIS — Z5181 Encounter for therapeutic drug level monitoring: Secondary | ICD-10-CM

## 2015-07-19 DIAGNOSIS — I2699 Other pulmonary embolism without acute cor pulmonale: Secondary | ICD-10-CM

## 2015-07-19 DIAGNOSIS — Z7901 Long term (current) use of anticoagulants: Secondary | ICD-10-CM | POA: Diagnosis not present

## 2015-07-19 LAB — POCT INR: INR: 3.3

## 2015-07-25 ENCOUNTER — Other Ambulatory Visit: Payer: Self-pay | Admitting: Cardiology

## 2015-07-28 ENCOUNTER — Encounter: Payer: Self-pay | Admitting: Internal Medicine

## 2015-07-28 ENCOUNTER — Ambulatory Visit (INDEPENDENT_AMBULATORY_CARE_PROVIDER_SITE_OTHER): Payer: Medicare Other | Admitting: Internal Medicine

## 2015-07-28 VITALS — BP 142/80 | HR 81 | Ht 70.0 in | Wt 212.2 lb

## 2015-07-28 DIAGNOSIS — I2510 Atherosclerotic heart disease of native coronary artery without angina pectoris: Secondary | ICD-10-CM | POA: Diagnosis not present

## 2015-07-28 DIAGNOSIS — I4891 Unspecified atrial fibrillation: Secondary | ICD-10-CM

## 2015-07-28 DIAGNOSIS — Z950 Presence of cardiac pacemaker: Secondary | ICD-10-CM | POA: Diagnosis not present

## 2015-07-28 DIAGNOSIS — R001 Bradycardia, unspecified: Secondary | ICD-10-CM

## 2015-07-28 DIAGNOSIS — I2583 Coronary atherosclerosis due to lipid rich plaque: Secondary | ICD-10-CM

## 2015-07-28 LAB — CUP PACEART INCLINIC DEVICE CHECK
Brady Statistic RV Percent Paced: 0 %
Date Time Interrogation Session: 20161216164532
Implantable Lead Implant Date: 20141219
Implantable Lead Model: 5076
Lead Channel Pacing Threshold Amplitude: 0.9 V
Lead Channel Pacing Threshold Amplitude: 0.9 V
Lead Channel Pacing Threshold Amplitude: 1.2 V
Lead Channel Sensing Intrinsic Amplitude: 10.3 mV
Lead Channel Setting Pacing Amplitude: 1.9 V
Lead Channel Setting Pacing Pulse Width: 0.4 ms
MDC IDC LEAD IMPLANT DT: 20140822
MDC IDC LEAD LOCATION: 753859
MDC IDC LEAD LOCATION: 753860
MDC IDC MSMT LEADCHNL RA IMPEDANCE VALUE: 507 Ohm
MDC IDC MSMT LEADCHNL RA PACING THRESHOLD PULSEWIDTH: 0.4 ms
MDC IDC MSMT LEADCHNL RA SENSING INTR AMPL: 3.8 mV
MDC IDC MSMT LEADCHNL RV IMPEDANCE VALUE: 526 Ohm
MDC IDC MSMT LEADCHNL RV PACING THRESHOLD PULSEWIDTH: 0.4 ms
MDC IDC MSMT LEADCHNL RV PACING THRESHOLD PULSEWIDTH: 0.4 ms
MDC IDC SET LEADCHNL RV PACING AMPLITUDE: 1.9 V
MDC IDC STAT BRADY RA PERCENT PACED: 91 %
Pulse Gen Serial Number: 68066723

## 2015-07-28 NOTE — Progress Notes (Signed)
HPI [name] [name] returns today for followup. He is a pleasant 74 yo man with a h/o symptomatic bradycardia, s/p PPM insertion who developed an atrial lead dislodgement and was also found to have reduce R waves. He has undergone PPM lead revisions. Since his revision over 2 years ago, he has done well except for dietary indiscretion and an episode of syncope when he strained using the bathroom and tried to get up too quick.  He denies chest pain or sob. No edema. Allergies  Allergen Reactions  . Balsalazide     Other reaction(s): Unknown  . Codeine Nausea Only  . Diltiazem Hcl Itching and Rash  . Doxycycline Itching and Rash  . Infliximab Itching and Rash  . Mercaptopurine Itching and Rash  . Mesalamine Itching and Rash  . Methotrexate Itching and Rash  . Metronidazole Itching and Rash  . Pyridostigmine Bromide Nausea Only     Current Outpatient Prescriptions  Medication Sig Dispense Refill  . acetaminophen (TYLENOL) 325 MG tablet Take 650 mg by mouth every 6 (six) hours as needed for pain.    Marland Kitchen adalimumab (HUMIRA PEN) 40 MG/0.8ML injection Inject 40 mg into the skin every 21 ( twenty-one) days. Tuesday    . amiodarone (PACERONE) 200 MG tablet Take 200 mg by mouth daily.    Marland Kitchen aspirin 81 MG tablet Take 81 mg by mouth daily.      Marland Kitchen BREO ELLIPTA 100-25 MCG/INH AEPB INHALE 1 PUFF DAILY 60 each 3  . carvedilol (COREG) 3.125 MG tablet TAKE 1 TABLET (3.125 MG TOTAL) BY MOUTH 2 (TWO) TIMES DAILY. 180 tablet 3  . diphenhydrAMINE (BENADRYL) 25 mg capsule Take 25 mg by mouth See admin instructions. Take 25 mg 2 hours prior to Humira    . docusate sodium (COLACE) 100 MG capsule Take 100 mg by mouth daily as needed.     . ezetimibe (ZETIA) 10 MG tablet Take 10 mg by mouth daily.    . fluticasone (FLONASE) 50 MCG/ACT nasal spray Place 2 sprays into the nose daily as needed for allergies.     . folic acid (FOLVITE) 1 MG tablet Take 1 tablet by mouth daily.     . furosemide (LASIX) 40 MG tablet  Take 40 mg by mouth.    . hydrocortisone (ANUSOL-HC) 25 MG suppository Place 1 suppository rectally at bedtime as needed. Hemorid  1  . levothyroxine (SYNTHROID, LEVOTHROID) 50 MCG tablet Take 50 mcg by mouth daily before breakfast.    . loratadine (CLARITIN) 10 MG tablet Take 10 mg by mouth daily.    Marland Kitchen losartan (COZAAR) 100 MG tablet TAKE 1 TABLET BY MOUTH EVERY DAY 90 tablet 3  . Multiple Vitamin (MULTIVITAMIN) tablet Take 1 tablet by mouth daily.      . nitroGLYCERIN (NITROSTAT) 0.4 MG SL tablet Place 0.4 mg under the tongue every 5 (five) minutes as needed for chest pain (x 3 doses daily).    Marland Kitchen omeprazole (PRILOSEC) 40 MG capsule TAKE 1 CAPSULE BY MOUTH DAILY. 30 capsule 5  . predniSONE (DELTASONE) 20 MG tablet Take 20 mg by mouth 3 (three) times daily as needed (Start taking 2 days before humira and day of injection).     Marland Kitchen PROAIR HFA 108 (90 BASE) MCG/ACT inhaler Inhale 2 puffs into the lungs every 6 (six) hours as needed for wheezing or shortness of breath.     . ranitidine (ZANTAC) 150 MG tablet Take 150 mg by mouth See admin instructions. Take 2  hrs before Humira    . warfarin (COUMADIN) 5 MG tablet TAKE 1 TABLET EVERY DAY AS DIRECTED 30 tablet 3   No current facility-administered medications for this visit.     Past Medical History  Diagnosis Date  . Syncope and collapse     Evaluation by Dr Caryl Comes; Syncope probably neutrally mediated and modest resting sinus bradycardia. and 16 B. episode of SVT that was either A fib or another type of SVT. Patient improved with combination of holding ACE inhibitor or low BP and reducing beta blocker for bradycardia  . Hypertension                                                                                                                                                                                                                                                                                                                                                                                                                        . Coronary artery disease     a. CABG 2008. b. Relook cath 08/12/08 - all 5 grafts are patent  . Crohn's disease ([place])     Humara Rx in past  . SVT (supraventricular tachycardia) ([place])   . 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 History of benign colon tumor   . Mitral regurgitation     mild - echo 4/09  . Anxiety   . Gait difficulty     with Crestor (but tolerates simva)  . Pulmonary embolism ([place])  2007,b CT scan, IVC fiter placed then because of concern about coumadin use at that time.  . Orthostasis     treated  . Drug therapy     Intermittant steroids   . Warfarin anticoagulation   . Factor VIII     Factor VIII EXCESS.Marland KitchenMarland KitchenDr Gwenlyn Fudge.Marland Kitchenanother reason for coumadin  . Incomplete RBBB   . Drug therapy     question rash diltiazem, and amio  . Ejection fraction     EF 60-65%, echo, March, 2012, moderate diastolic dysfunction  . 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 Melvyn Novas; has chronic shortness of breath  . PAF (paroxysmal atrial fibrillation) ([place])     April, 2013, new diagnosis  . Bradycardia     a. s/p Biotronic pacemaker placement 04/02/13. b. Lead removal/new lead insertion 07/2013 due to dysfunction.  . Carotid artery disease ([place])     Dopplers to be checked, June, 2013  . Cough     October, 2013  . Tremor     October, 2013  . High cholesterol   . OSA (obstructive sleep apnea)     "quit wearing his mask" (07/30/2013)  . GERD (gastroesophageal reflux disease)     ROS:   All systems reviewed and negative except as noted in the HPI.   Past Surgical History  Procedure Laterality Date  . Cholecystectomy    . Back surgery    . Cataract extraction w/ intraocular lens  implant, bilateral Bilateral 1990's  . Colonoscopy w/ polypectomy    . Loop monitor  03/2013    "removed w/in 2 days" (07/30/2013)  . Lead revision  07/30/2013    DDD PM leads removed and a new set of PPM leads inserted via  the left subclavian vein. [id number]  . Tonsillectomy    . Insert / replace / remove pacemaker  03/2013    [facility]  . Cardiac catheterization  03/2013    [facility]  . Cardiac catheterization  2008; ~ 2011  . Coronary artery bypass graft  4/08    CABG X5  . Lumbar disc surgery  1978; 1986  . Colon surgery      "had a tumor removed" (07/30/2013)  . Vena cava filter placement  2007    PE  . Loop recorder implant N/A 03/24/2013    Procedure: LOOP RECORDER IMPLANT;  Surgeon: Deboraha Sprang, MD;  Location: [facility] CATH LAB;  Service: Cardiovascular;  Laterality: N/A;  . Permanent pacemaker insertion N/A 04/02/2013    Procedure: PERMANENT PACEMAKER INSERTION;  Surgeon: Evans Lance, MD;  Location: [facility] CATH LAB;  Service: Cardiovascular;  Laterality: N/A;  . Lead revision N/A 07/30/2013    Procedure: LEAD REVISION;  Surgeon: Evans Lance, MD;  Location[facility] CATH LAB;  Service: Cardiovascular;  Laterality: N/A;  . Colonoscopy N/A 01/13/2015    Procedure: COLONOSCOPY;  Surgeon: Manya Silvas, MD;  Location: [facility] ENDOSCOPY;  Service: Endoscopy;  Laterality: N/A;     Family History  Problem Relation Age of Onset  . Family history unknown: Yes     Social History   Social History  . Marital Status: Married    Spouse Name: N/A  . Number of Children: N/A  . Years of Education: N/A   Occupational History  . retired    Social History Main Topics  . Smoking status: Former Smoker -- 0.25 packs/day for 5 years    Types: Cigarettes  . Smokeless tobacco: Never Used     Comment: 07/30/2013 "stopped smoking ~  1963"  . Alcohol Use: No  . Drug Use: No  . Sexual Activity: Not Currently   Other Topics Concern  . Not on file   Social History Narrative     BP 142/80 mmHg  Pulse 81  Ht 5\' 10"  (1.778 m)  Wt 212 lb 3.2 oz (96.253 kg)  BMI 30.45 kg/m2  Physical Exam:  Well appearing 74 yo man, NAD HEENT: Unremarkable Neck:  6 cm JVD, no thyromegally Back:  No CVA tenderness Lungs:  Clear with no  wheezes, rales, or rhonchi HEART:  Regular rate rhythm, no murmurs, no rubs, no clicks Abd:  soft, positive bowel sounds, no organomegally, no rebound, no guarding Ext:  2 plus pulses, no edema, no cyanosis, no clubbing Skin:  No rashes no nodules Neuro:  CN II through XII intact, motor grossly intact   DEVICE  Normal device function.  See PaceArt for details.   Assess/Plan:

## 2015-07-28 NOTE — Patient Instructions (Signed)
Medication Instructions:  Your physician recommends that you continue on your current medications as directed. Please refer to the Current Medication list given to you today.   Labwork: None ordered   Testing/Procedures: None ordered   Follow-Up: Your physician wants you to follow-up in: 12 months with Dr Taylor You will receive a reminder letter in the mail two months in advance. If you don't receive a letter, please call our office to schedule the follow-up appointment.  Remote monitoring is used to monitor your Pacemaker  from home. This monitoring reduces the number of office visits required to check your device to one time per year. It allows us to keep an eye on the functioning of your device to ensure it is working properly. You are scheduled for a device check from home on 10/30/15. You may send your transmission at any time that day. If you have a wireless device, the transmission will be sent automatically. After your physician reviews your transmission, you will receive a postcard with your next transmission date.     Any Other Special Instructions Will Be Listed Below (If Applicable).     If you need a refill on your cardiac medications before your next appointment, please call your pharmacy.   

## 2015-07-28 NOTE — Assessment & Plan Note (Signed)
He denies anginal symptoms. Will follow. 

## 2015-07-28 NOTE — Assessment & Plan Note (Signed)
He is maintaining NSR very nicely. Will follow.  

## 2015-07-28 NOTE — Assessment & Plan Note (Signed)
His Biotronik DDD PM is working normally. Will recheck in several months.  

## 2015-08-02 ENCOUNTER — Ambulatory Visit (INDEPENDENT_AMBULATORY_CARE_PROVIDER_SITE_OTHER): Payer: Medicare Other

## 2015-08-02 DIAGNOSIS — D66 Hereditary factor VIII deficiency: Secondary | ICD-10-CM | POA: Diagnosis not present

## 2015-08-02 DIAGNOSIS — I2699 Other pulmonary embolism without acute cor pulmonale: Secondary | ICD-10-CM | POA: Diagnosis not present

## 2015-08-02 DIAGNOSIS — Z5181 Encounter for therapeutic drug level monitoring: Secondary | ICD-10-CM

## 2015-08-02 DIAGNOSIS — Z7901 Long term (current) use of anticoagulants: Secondary | ICD-10-CM | POA: Diagnosis not present

## 2015-08-02 LAB — POCT INR: INR: 2.9

## 2015-08-09 ENCOUNTER — Other Ambulatory Visit: Payer: Self-pay | Admitting: Cardiovascular Disease

## 2015-08-10 NOTE — Telephone Encounter (Signed)
Requested Prescriptions   Pending Prescriptions Disp Refills  . losartan (COZAAR) 100 MG tablet [Pharmacy Med Name: LOSARTAN POTASSIUM 100MG  TAB] 90 tablet 3    Sig: TAKE ONE TABLET EVERY DAY

## 2015-08-23 ENCOUNTER — Other Ambulatory Visit: Payer: Self-pay | Admitting: Cardiovascular Disease

## 2015-08-23 ENCOUNTER — Ambulatory Visit: Payer: Medicare Other | Admitting: Family Medicine

## 2015-09-04 ENCOUNTER — Ambulatory Visit (INDEPENDENT_AMBULATORY_CARE_PROVIDER_SITE_OTHER): Payer: Medicare HMO | Admitting: Family Medicine

## 2015-09-04 ENCOUNTER — Other Ambulatory Visit: Payer: Self-pay | Admitting: Cardiovascular Disease

## 2015-09-04 ENCOUNTER — Encounter: Payer: Self-pay | Admitting: Family Medicine

## 2015-09-04 VITALS — BP 164/72 | HR 72 | Temp 97.7°F | Resp 16 | Wt 213.0 lb

## 2015-09-04 DIAGNOSIS — R4701 Aphasia: Secondary | ICD-10-CM

## 2015-09-04 DIAGNOSIS — G912 (Idiopathic) normal pressure hydrocephalus: Secondary | ICD-10-CM

## 2015-09-04 DIAGNOSIS — Z4802 Encounter for removal of sutures: Secondary | ICD-10-CM | POA: Diagnosis not present

## 2015-09-04 DIAGNOSIS — G309 Alzheimer's disease, unspecified: Secondary | ICD-10-CM

## 2015-09-04 DIAGNOSIS — G20 Parkinson's disease: Secondary | ICD-10-CM

## 2015-09-04 NOTE — Progress Notes (Signed)
Patient ID: [name] [name], male   DOB: 03/22/1941, 75 y.o.   MRN: [id number]    Subjective:  HPI Pt is here today for suture removal. He had a shunt placed on the right side of his head that drains it to his abdomen. Pt seems to be doing well and gait is better.  Prior to Admission medications   Medication Sig Start Date End Date Taking? Authorizing Provider  acetaminophen (TYLENOL) 325 MG tablet Take by mouth. 08/25/15 09/04/15 Yes Historical Provider, MD  adalimumab (HUMIRA PEN) 40 MG/0.8ML injection Inject 40 mg into the skin every 21 ( twenty-one) days. Tuesday   Yes Historical Provider, MD  amiodarone (PACERONE) 200 MG tablet TAKE 1/2 TO 1 TABLET DAILY ALTERNATING WITH 100MG  08/23/15  Yes Minna Merritts, MD  aspirin 81 MG tablet Take 81 mg by mouth daily.     Yes Historical Provider, MD  carvedilol (COREG) 3.125 MG tablet TAKE ONE TABLET (3.125MG ) BY MOUTH TWICEDAILY 09/04/15  Yes Minna Merritts, MD  diphenhydrAMINE (BENADRYL) 25 mg capsule Take 25 mg by mouth See admin instructions. Take 25 mg 2 hours prior to Humira   Yes Historical Provider, MD  docusate sodium (COLACE) 100 MG capsule Take 100 mg by mouth daily as needed.    Yes Historical Provider, MD  ezetimibe (ZETIA) 10 MG tablet Take 10 mg by mouth daily.   Yes Historical Provider, MD  fluticasone (FLONASE) 50 MCG/ACT nasal spray Place 2 sprays into the nose daily as needed for allergies.    Yes Historical Provider, MD  folic acid (FOLVITE) 1 MG tablet Take 1 tablet by mouth daily.  10/20/10  Yes Historical Provider, MD  furosemide (LASIX) 40 MG tablet Take 40 mg by mouth.   Yes Historical Provider, MD  hydrocortisone (ANUSOL-HC) 25 MG suppository Place 1 suppository rectally at bedtime as needed. Hemorid 11/30/14  Yes Historical Provider, MD  levothyroxine (SYNTHROID, LEVOTHROID) 50 MCG tablet Take 50 mcg by mouth daily before breakfast.   Yes Historical Provider, MD  loratadine (CLARITIN) 10 MG tablet Take 10 mg by mouth daily.   Yes  Historical Provider, MD  losartan (COZAAR) 100 MG tablet TAKE ONE TABLET EVERY DAY 08/10/15  Yes Minna Merritts, MD  Multiple Vitamin (MULTIVITAMIN) tablet Take 1 tablet by mouth daily.     Yes Historical Provider, MD  nitroGLYCERIN (NITROSTAT) 0.4 MG SL tablet Place 0.4 mg under the tongue every 5 (five) minutes as needed for chest pain (x 3 doses daily).   Yes Historical Provider, MD  omeprazole (PRILOSEC) 40 MG capsule TAKE 1 CAPSULE BY MOUTH DAILY. 12/26/14  Yes Evans Lance, MD  oxyCODONE (OXY IR/ROXICODONE) 5 MG immediate release tablet Take by mouth. 08/25/15 09/04/15 Yes Historical Provider, MD  polyethylene glycol (MIRALAX / GLYCOLAX) packet Take by mouth.   Yes Historical Provider, MD  predniSONE (DELTASONE) 20 MG tablet Take 20 mg by mouth 3 (three) times daily as needed (Start taking 2 days before humira and day of injection).  11/30/13  Yes Historical Provider, MD  PROAIR HFA 108 (90 BASE) MCG/ACT inhaler Inhale 2 puffs into the lungs every 6 (six) hours as needed for wheezing or shortness of breath.  03/14/14  Yes Historical Provider, MD  ranitidine (ZANTAC) 150 MG tablet Take 150 mg by mouth See admin instructions. Take 2 hrs before Humira   Yes Historical Provider, MD  warfarin (COUMADIN) 5 MG tablet TAKE 1 TABLET EVERY DAY AS DIRECTED 07/25/15  Yes Minna Merritts, MD  BREO ELLIPTA 100-25 MCG/INH AEPB INHALE 1 PUFF DAILY Patient not taking: Reported on 09/04/2015 01/18/15   Jerrol Banana., MD    Patient Active Problem List   Diagnosis Date Noted  . 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 Sepsis secondary to UTI ([place]) 06/05/2015  . Immunosuppression ([place]) 05/01/2015  . Acquired hypothyroidism 03/08/2015  . Absolute anemia 03/08/2015  . Anxiety 03/08/2015  . Airway hyperreactivity 03/08/2015  . CAD in native artery 03/08/2015  . CCF (congestive cardiac failure) ([place]) 03/08/2015  . Neoplasm of digestive system 03/08/2015  . Regional enteritis ([place]) 03/08/2015  . Deep vein  thrombosis ([place]) 03/08/2015  . Dementia of frontal lobe type 03/08/2015  . Elevated diaphragm 03/08/2015  . Acid reflux 03/08/2015  . Borderline diabetes 03/08/2015  . H/O deep venous thrombosis 03/08/2015  . Hypercholesteremia 03/08/2015  . Adiposity 03/08/2015  . Obstructive apnea 03/08/2015  . 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 Kidney failure 03/08/2015  . 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 Irritable colon 03/08/2015  . [place] (subconjunctival hemorrhage) 03/08/2015  . Temporary cerebral vascular dysfunction 03/08/2015  . Diabetes mellitus, type 2 ([place]) 03/08/2015  . Non-fluent aphasia 06/27/2014  . Hyperlipidemia 06/06/2014  . Atherosclerosis of coronary artery 04/26/2014  . Arterial disease ([place]) 04/26/2014  . Essential (primary) hypertension 04/26/2014  . Disorder of mitral valve 04/26/2014  . Other pulmonary embolism and infarction 04/26/2014  . Postsurgical aortocoronary bypass status 04/26/2014  . Pulmonary embolism with infarction ([place]) 04/26/2014  . Hyperlipemia 12/07/2013  . Encounter for therapeutic drug monitoring 09/22/2013  . Bradycardia   . Mechanical complication of other vascular device, implant, and graft 07/30/2013  . Cardiac device, implant, or graft complication [id number]  . Pacemaker 07/13/2013  . Groin hematoma 05/10/2013  . Poor memory 10/28/2012  . Amnesia 10/28/2012  . Cough   . Tremor   . Atrial fibrillation ([place])   . Carotid artery disease ([place])   . Hx of amiodarone therapy 12/06/2011  . 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 Syncope and collapse   . Hypertension   . Coronary artery disease   . Mitral regurgitation   . Pulmonary embolism ([place])   . Orthostasis   . Drug therapy   . S/P IVC filter   . Warfarin anticoagulation   . Factor VIII   . Hx of CABG   . Incomplete RBBB   . Drug therapy   . Shortness of breath 10/25/2010  . Crohn's  disease ([place]) 01/25/2009    Past Medical History  Diagnosis Date  . Syncope and collapse     Evaluation by Dr Caryl Comes; Syncope probably neutrally mediated and modest resting sinus bradycardia. and 16 B. episode of SVT that was either A fib or another type of SVT. Patient improved with combination of holding ACE inhibitor or low BP and reducing beta blocker for bradycardia  . Hypertension                                                                                                                                                                                                                                                                                                                                                                                                                       .  Coronary artery disease     a. CABG 2008. b. Relook cath 08/12/08 - all 5 grafts are patent  . Crohn's disease ([place])     Humara Rx in past  . SVT (supraventricular tachycardia) ([place])   . 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 History of benign colon tumor   . Mitral regurgitation     mild - echo 4/09  . Anxiety   . Gait difficulty     with Crestor (but tolerates simva)  . Pulmonary embolism ([place])     2007,b CT scan, IVC fiter placed then because of concern about coumadin use at that time.  . Orthostasis     treated  . Drug therapy     Intermittant steroids   . Warfarin anticoagulation   . Factor VIII     Factor VIII EXCESS.Marland KitchenMarland KitchenDr Gwenlyn Fudge.Marland Kitchenanother reason for coumadin  . Incomplete RBBB   . Drug therapy     question rash diltiazem, and amio  . Ejection fraction     EF 60-65%, echo, March, 2012, moderate diastolic dysfunction  . 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 Melvyn Novas; has chronic shortness of breath  . PAF (paroxysmal atrial fibrillation) ([place])     April, 2013, new diagnosis  . Bradycardia     a. s/p Biotronic pacemaker placement 04/02/13. b. Lead removal/new lead insertion 07/2013 due to  dysfunction.  . Carotid artery disease ([place])     Dopplers to be checked, June, 2013  . Cough     October, 2013  . Tremor     October, 2013  . High cholesterol   . OSA (obstructive sleep apnea)     "quit wearing his mask" (07/30/2013)  . GERD (gastroesophageal reflux disease)     Social History   Social History  . Marital Status: Married    Spouse Name: N/A  . Number of Children: N/A  . Years of Education: N/A   Occupational History  . retired    Social History Main Topics  . Smoking status: Former Smoker -- 0.25 packs/day for 5 years    Types: Cigarettes  . Smokeless tobacco: Never Used     Comment: 07/30/2013 "stopped smoking ~ 1963"  . Alcohol Use: No  . Drug Use: No  . Sexual Activity: Not Currently   Other Topics Concern  . Not on file   Social History Narrative    Allergies  Allergen Reactions  . Balsalazide     Other reaction(s): Unknown  . Codeine Nausea Only  . Diltiazem Hcl Itching and Rash  . Doxycycline Itching and Rash  . Infliximab Itching and Rash  . Mercaptopurine Itching and Rash  . Mesalamine Itching and Rash  . Methotrexate Itching and Rash  . Metronidazole Itching and Rash  . Pyridostigmine Bromide Nausea Only    Review of Systems  Constitutional: Negative.   HENT: Negative.   Eyes: Negative.   Respiratory: Negative.   Cardiovascular: Negative.   Gastrointestinal: Negative.   Genitourinary: Negative.   Musculoskeletal: Negative.   Skin: Negative.   Neurological: Negative.   Endo/Heme/Allergies: Negative.   Psychiatric/Behavioral: Positive for memory loss.    Immunization History  Administered Date(s) Administered  . Influenza,inj,Quad PF,36+ Mos 06/05/2015  . Influenza-Unspecified 05/12/2013   Objective:  BP 164/72 mmHg  Pulse 72  Temp(Src) 97.7 F (36.5 C) (Oral)  Resp 16  Wt 213 lb (96.616 kg)  Physical Exam  Constitutional: He is oriented to person, place, and time and well-developed, well-nourished,  and in no  distress.  Neck: Neck supple.  Cardiovascular: Normal rate, regular rhythm and normal heart sounds.   Pulmonary/Chest: Effort normal and breath sounds normal.  Abdominal: Soft.  Neurological: He is alert and oriented to person, place, and time.  Gait much improved since last visit.  Skin: Skin is warm and dry.  Psychiatric: Mood, memory, affect and judgment normal.    Lab Results  Component Value Date   WBC 8.5 10/25/2013   HGB 13.4 10/25/2013   HCT 39.3* 10/25/2013   PLT 221 10/25/2013   GLUCOSE 83 06/06/2015   CHOL 121 03/21/2013   TRIG 66 03/21/2013   HDL 49 03/21/2013   LDLCALC 59 03/21/2013   TSH 2.717 04/02/2013   INR 2.9 08/02/2015   HGBA1C 6.9* 03/21/2013    CMP     Component Value Date/Time   NA 130* 06/06/2015 0550   NA 136 10/27/2013 0532   NA 132* 10/28/2012 1501   K 4.4 06/06/2015 0550   K 3.6 10/27/2013 0532   CL 95* 06/06/2015 0550   CL 106 10/27/2013 0532   CO2 26 06/06/2015 0550   CO2 25 10/27/2013 0532   GLUCOSE 83 06/06/2015 0550   GLUCOSE 86 10/27/2013 0532   GLUCOSE 105* 10/28/2012 1501   BUN 15 06/06/2015 0550   BUN 14 10/27/2013 0532   BUN 15 10/28/2012 1501   CREATININE 0.92 06/06/2015 0550   CREATININE 1.03 10/27/2013 0532   CALCIUM 8.5* 06/06/2015 0550   CALCIUM 8.1* 10/27/2013 0532   PROT 7.4 10/24/2013 1122   PROT 5.7* 03/24/2013 0530   ALBUMIN 3.6 10/24/2013 1122   ALBUMIN 3.0* 03/24/2013 0530   AST 113* 10/24/2013 1122   AST 98* 03/24/2013 0530   ALT 146* 10/24/2013 1122   ALT 162* 03/24/2013 0530   ALKPHOS 95 10/24/2013 1122   ALKPHOS 157* 03/24/2013 0530   BILITOT 1.3* 10/24/2013 1122   BILITOT 0.3 03/24/2013 0530   GFRNONAA >60 06/06/2015 0550   GFRNONAA >60 10/27/2013 0532   GFRAA >60 06/06/2015 0550   GFRAA >60 10/27/2013 0532    Assessment and Plan :  1. NPH (normal pressure hydrocephalus) Sutures removed from shunt placement surgery. Pt seems to be recovering well from surgery and gait has much improved.  2.  Visit for suture removal   3. 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 CAD All risk factors treated. 6. Atrial fibrillation Patient was seen and examined by Dr. Miguel Aschoff, and noted scribed by Webb Laws, [place] MD [facility] 09/04/2015 11:28 AM

## 2015-09-13 ENCOUNTER — Ambulatory Visit (INDEPENDENT_AMBULATORY_CARE_PROVIDER_SITE_OTHER): Payer: Medicare HMO

## 2015-09-13 DIAGNOSIS — Z5181 Encounter for therapeutic drug level monitoring: Secondary | ICD-10-CM

## 2015-09-13 DIAGNOSIS — I2699 Other pulmonary embolism without acute cor pulmonale: Secondary | ICD-10-CM | POA: Diagnosis not present

## 2015-09-13 DIAGNOSIS — Z7901 Long term (current) use of anticoagulants: Secondary | ICD-10-CM

## 2015-09-13 DIAGNOSIS — D66 Hereditary factor VIII deficiency: Secondary | ICD-10-CM | POA: Diagnosis not present

## 2015-09-13 LAB — POCT INR: INR: 1.6

## 2015-09-27 ENCOUNTER — Ambulatory Visit (INDEPENDENT_AMBULATORY_CARE_PROVIDER_SITE_OTHER): Payer: Medicare HMO

## 2015-09-27 DIAGNOSIS — Z7901 Long term (current) use of anticoagulants: Secondary | ICD-10-CM | POA: Diagnosis not present

## 2015-09-27 DIAGNOSIS — D66 Hereditary factor VIII deficiency: Secondary | ICD-10-CM

## 2015-09-27 DIAGNOSIS — I2699 Other pulmonary embolism without acute cor pulmonale: Secondary | ICD-10-CM

## 2015-09-27 DIAGNOSIS — Z5181 Encounter for therapeutic drug level monitoring: Secondary | ICD-10-CM | POA: Diagnosis not present

## 2015-09-27 LAB — POCT INR: INR: 3

## 2015-10-18 ENCOUNTER — Ambulatory Visit (INDEPENDENT_AMBULATORY_CARE_PROVIDER_SITE_OTHER): Payer: Medicare HMO

## 2015-10-18 DIAGNOSIS — Z5181 Encounter for therapeutic drug level monitoring: Secondary | ICD-10-CM

## 2015-10-18 DIAGNOSIS — D66 Hereditary factor VIII deficiency: Secondary | ICD-10-CM | POA: Diagnosis not present

## 2015-10-18 DIAGNOSIS — I2699 Other pulmonary embolism without acute cor pulmonale: Secondary | ICD-10-CM

## 2015-10-18 DIAGNOSIS — Z7901 Long term (current) use of anticoagulants: Secondary | ICD-10-CM | POA: Diagnosis not present

## 2015-10-18 LAB — POCT INR: INR: 2.8

## 2015-10-30 ENCOUNTER — Ambulatory Visit (INDEPENDENT_AMBULATORY_CARE_PROVIDER_SITE_OTHER): Payer: Medicare HMO | Admitting: *Deleted

## 2015-10-30 DIAGNOSIS — R001 Bradycardia, unspecified: Secondary | ICD-10-CM | POA: Diagnosis not present

## 2015-10-30 DIAGNOSIS — Z950 Presence of cardiac pacemaker: Secondary | ICD-10-CM

## 2015-10-31 NOTE — Progress Notes (Signed)
Remote pacemaker transmission.   

## 2015-11-15 ENCOUNTER — Ambulatory Visit (INDEPENDENT_AMBULATORY_CARE_PROVIDER_SITE_OTHER): Payer: Medicare HMO

## 2015-11-15 DIAGNOSIS — I2699 Other pulmonary embolism without acute cor pulmonale: Secondary | ICD-10-CM | POA: Diagnosis not present

## 2015-11-15 DIAGNOSIS — Z5181 Encounter for therapeutic drug level monitoring: Secondary | ICD-10-CM

## 2015-11-15 DIAGNOSIS — D66 Hereditary factor VIII deficiency: Secondary | ICD-10-CM | POA: Diagnosis not present

## 2015-11-15 DIAGNOSIS — Z7901 Long term (current) use of anticoagulants: Secondary | ICD-10-CM

## 2015-11-15 LAB — POCT INR: INR: 2.3

## 2015-11-25 ENCOUNTER — Encounter: Payer: Self-pay | Admitting: Emergency Medicine

## 2015-11-25 ENCOUNTER — Inpatient Hospital Stay
Admission: EM | Admit: 2015-11-25 | Discharge: 2015-12-11 | DRG: 308 | Disposition: E | Payer: Medicare HMO | Attending: Internal Medicine | Admitting: Internal Medicine

## 2015-11-25 DIAGNOSIS — R55 Syncope and collapse: Secondary | ICD-10-CM

## 2015-11-25 DIAGNOSIS — E871 Hypo-osmolality and hyponatremia: Secondary | ICD-10-CM | POA: Diagnosis present

## 2015-11-25 DIAGNOSIS — R4189 Other symptoms and signs involving cognitive functions and awareness: Secondary | ICD-10-CM | POA: Insufficient documentation

## 2015-11-25 DIAGNOSIS — Z950 Presence of cardiac pacemaker: Secondary | ICD-10-CM | POA: Diagnosis not present

## 2015-11-25 DIAGNOSIS — I482 Chronic atrial fibrillation: Secondary | ICD-10-CM | POA: Diagnosis present

## 2015-11-25 DIAGNOSIS — Z888 Allergy status to other drugs, medicaments and biological substances status: Secondary | ICD-10-CM

## 2015-11-25 DIAGNOSIS — Z951 Presence of aortocoronary bypass graft: Secondary | ICD-10-CM | POA: Diagnosis not present

## 2015-11-25 DIAGNOSIS — K219 Gastro-esophageal reflux disease without esophagitis: Secondary | ICD-10-CM | POA: Diagnosis present

## 2015-11-25 DIAGNOSIS — G939 Disorder of brain, unspecified: Secondary | ICD-10-CM | POA: Diagnosis present

## 2015-11-25 DIAGNOSIS — R230 Cyanosis: Secondary | ICD-10-CM | POA: Diagnosis present

## 2015-11-25 DIAGNOSIS — R4701 Aphasia: Secondary | ICD-10-CM | POA: Diagnosis present

## 2015-11-25 DIAGNOSIS — I472 Ventricular tachycardia, unspecified: Secondary | ICD-10-CM

## 2015-11-25 DIAGNOSIS — I629 Nontraumatic intracranial hemorrhage, unspecified: Secondary | ICD-10-CM | POA: Insufficient documentation

## 2015-11-25 DIAGNOSIS — Z4659 Encounter for fitting and adjustment of other gastrointestinal appliance and device: Secondary | ICD-10-CM

## 2015-11-25 DIAGNOSIS — Z9181 History of falling: Secondary | ICD-10-CM

## 2015-11-25 DIAGNOSIS — I10 Essential (primary) hypertension: Secondary | ICD-10-CM | POA: Diagnosis present

## 2015-11-25 DIAGNOSIS — N179 Acute kidney failure, unspecified: Secondary | ICD-10-CM | POA: Diagnosis present

## 2015-11-25 DIAGNOSIS — Z87891 Personal history of nicotine dependence: Secondary | ICD-10-CM

## 2015-11-25 DIAGNOSIS — Z886 Allergy status to analgesic agent status: Secondary | ICD-10-CM | POA: Diagnosis not present

## 2015-11-25 DIAGNOSIS — Z7951 Long term (current) use of inhaled steroids: Secondary | ICD-10-CM | POA: Diagnosis not present

## 2015-11-25 DIAGNOSIS — J9601 Acute respiratory failure with hypoxia: Secondary | ICD-10-CM | POA: Diagnosis present

## 2015-11-25 DIAGNOSIS — I959 Hypotension, unspecified: Secondary | ICD-10-CM | POA: Diagnosis present

## 2015-11-25 DIAGNOSIS — R404 Transient alteration of awareness: Secondary | ICD-10-CM | POA: Diagnosis not present

## 2015-11-25 DIAGNOSIS — Z9049 Acquired absence of other specified parts of digestive tract: Secondary | ICD-10-CM | POA: Diagnosis not present

## 2015-11-25 DIAGNOSIS — I619 Nontraumatic intracerebral hemorrhage, unspecified: Secondary | ICD-10-CM | POA: Diagnosis present

## 2015-11-25 DIAGNOSIS — G9340 Encephalopathy, unspecified: Secondary | ICD-10-CM | POA: Diagnosis present

## 2015-11-25 DIAGNOSIS — I252 Old myocardial infarction: Secondary | ICD-10-CM | POA: Diagnosis not present

## 2015-11-25 DIAGNOSIS — I6782 Cerebral ischemia: Secondary | ICD-10-CM | POA: Diagnosis present

## 2015-11-25 DIAGNOSIS — G309 Alzheimer's disease, unspecified: Secondary | ICD-10-CM | POA: Diagnosis not present

## 2015-11-25 DIAGNOSIS — Z79899 Other long term (current) drug therapy: Secondary | ICD-10-CM | POA: Diagnosis not present

## 2015-11-25 DIAGNOSIS — Z86711 Personal history of pulmonary embolism: Secondary | ICD-10-CM

## 2015-11-25 DIAGNOSIS — K5090 Crohn's disease, unspecified, without complications: Secondary | ICD-10-CM | POA: Diagnosis present

## 2015-11-25 DIAGNOSIS — Z01818 Encounter for other preprocedural examination: Secondary | ICD-10-CM | POA: Diagnosis not present

## 2015-11-25 DIAGNOSIS — Z7982 Long term (current) use of aspirin: Secondary | ICD-10-CM | POA: Diagnosis not present

## 2015-11-25 DIAGNOSIS — Z86718 Personal history of other venous thrombosis and embolism: Secondary | ICD-10-CM | POA: Diagnosis not present

## 2015-11-25 DIAGNOSIS — D66 Hereditary factor VIII deficiency: Secondary | ICD-10-CM | POA: Diagnosis present

## 2015-11-25 DIAGNOSIS — Z961 Presence of intraocular lens: Secondary | ICD-10-CM | POA: Diagnosis present

## 2015-11-25 DIAGNOSIS — I495 Sick sinus syndrome: Secondary | ICD-10-CM | POA: Diagnosis present

## 2015-11-25 DIAGNOSIS — R9431 Abnormal electrocardiogram [ECG] [EKG]: Secondary | ICD-10-CM | POA: Diagnosis not present

## 2015-11-25 DIAGNOSIS — Z9109 Other allergy status, other than to drugs and biological substances: Secondary | ICD-10-CM

## 2015-11-25 DIAGNOSIS — Z9842 Cataract extraction status, left eye: Secondary | ICD-10-CM | POA: Diagnosis not present

## 2015-11-25 DIAGNOSIS — Z9841 Cataract extraction status, right eye: Secondary | ICD-10-CM

## 2015-11-25 DIAGNOSIS — Z66 Do not resuscitate: Secondary | ICD-10-CM | POA: Diagnosis present

## 2015-11-25 DIAGNOSIS — G4733 Obstructive sleep apnea (adult) (pediatric): Secondary | ICD-10-CM | POA: Diagnosis present

## 2015-11-25 DIAGNOSIS — F0280 Dementia in other diseases classified elsewhere without behavioral disturbance: Secondary | ICD-10-CM | POA: Diagnosis present

## 2015-11-25 DIAGNOSIS — Z7901 Long term (current) use of anticoagulants: Secondary | ICD-10-CM

## 2015-11-25 DIAGNOSIS — I2510 Atherosclerotic heart disease of native coronary artery without angina pectoris: Secondary | ICD-10-CM | POA: Insufficient documentation

## 2015-11-25 DIAGNOSIS — Z982 Presence of cerebrospinal fluid drainage device: Secondary | ICD-10-CM

## 2015-11-25 DIAGNOSIS — R092 Respiratory arrest: Secondary | ICD-10-CM | POA: Diagnosis present

## 2015-11-25 DIAGNOSIS — Z9889 Other specified postprocedural states: Secondary | ICD-10-CM | POA: Diagnosis not present

## 2015-11-25 LAB — CBC WITH DIFFERENTIAL/PLATELET
BASOS PCT: 1 %
Basophils Absolute: 0.1 10*3/uL (ref 0–0.1)
EOS ABS: 0.3 10*3/uL (ref 0–0.7)
EOS PCT: 3 %
HCT: 40.8 % (ref 40.0–52.0)
Hemoglobin: 13.8 g/dL (ref 13.0–18.0)
LYMPHS PCT: 46 %
Lymphs Abs: 4.4 10*3/uL — ABNORMAL HIGH (ref 1.0–3.6)
MCH: 30.6 pg (ref 26.0–34.0)
MCHC: 33.8 g/dL (ref 32.0–36.0)
MCV: 90.5 fL (ref 80.0–100.0)
MONO ABS: 1.7 10*3/uL — AB (ref 0.2–1.0)
MONOS PCT: 18 %
NEUTROS ABS: 3 10*3/uL (ref 1.4–6.5)
NEUTROS PCT: 32 %
PLATELETS: 308 10*3/uL (ref 150–440)
RBC: 4.51 MIL/uL (ref 4.40–5.90)
RDW: 16.3 % — AB (ref 11.5–14.5)
WBC: 9.5 10*3/uL (ref 3.8–10.6)

## 2015-11-25 LAB — BASIC METABOLIC PANEL
Anion gap: 5 (ref 5–15)
BUN: 23 mg/dL — ABNORMAL HIGH (ref 6–20)
CALCIUM: 8.9 mg/dL (ref 8.9–10.3)
CO2: 27 mmol/L (ref 22–32)
CREATININE: 1.5 mg/dL — AB (ref 0.61–1.24)
Chloride: 99 mmol/L — ABNORMAL LOW (ref 101–111)
GFR, EST AFRICAN AMERICAN: 50 mL/min — AB (ref >60)
GFR, EST NON AFRICAN AMERICAN: 43 mL/min — AB (ref >60)
Glucose, Bld: 109 mg/dL — ABNORMAL HIGH (ref 65–99)
Potassium: 3.7 mmol/L (ref 3.5–5.1)
SODIUM: 131 mmol/L — AB (ref 135–145)

## 2015-11-25 LAB — MRSA PCR SCREENING: MRSA by PCR: NEGATIVE

## 2015-11-25 LAB — GLUCOSE, CAPILLARY: Glucose-Capillary: 103 mg/dL — ABNORMAL HIGH (ref 65–99)

## 2015-11-25 LAB — TROPONIN I: Troponin I: <0.03 ng/mL (ref <0.031)

## 2015-11-25 LAB — PROTIME-INR
INR: 3.16
PROTHROMBIN TIME: 31.8 s — AB (ref 11.4–15.0)

## 2015-11-25 LAB — MAGNESIUM: Magnesium: 2.1 mg/dL (ref 1.7–2.4)

## 2015-11-25 MED ORDER — ASPIRIN EC 81 MG PO TBEC
81.0000 mg | DELAYED_RELEASE_TABLET | Freq: Every day | ORAL | Status: DC
  Administered 2015-11-26 (×2): 81 mg via ORAL
  Filled 2015-11-25: qty 1

## 2015-11-25 MED ORDER — BISACODYL 10 MG RE SUPP: 10.0000 mg | Freq: Every day | RECTAL | Status: DC | PRN

## 2015-11-25 MED ORDER — WARFARIN SODIUM 1 MG PO TABS: 2.5000 mg | ORAL_TABLET | ORAL | Status: DC

## 2015-11-25 MED ORDER — ARFORMOTEROL TARTRATE 15 MCG/2ML IN NEBU
15.0000 ug | INHALATION_SOLUTION | Freq: Two times a day (BID) | RESPIRATORY_TRACT | Status: DC
  Administered 2015-11-25 – 2015-11-26 (×2): 15 ug via RESPIRATORY_TRACT
  Filled 2015-11-25 (×5): qty 2

## 2015-11-25 MED ORDER — AMIODARONE IV BOLUS ONLY 150 MG/100ML
INTRAVENOUS | Status: AC
Start: 2015-11-25 — End: 2015-11-25
  Administered 2015-11-25: 150 mg
  Filled 2015-11-25: qty 100

## 2015-11-25 MED ORDER — AMIODARONE HCL IN DEXTROSE 360-4.14 MG/200ML-% IV SOLN
30.0000 mg/h | INTRAVENOUS | Status: DC
  Administered 2015-11-25 – 2015-11-26 (×3): 30 mg/h via INTRAVENOUS
  Filled 2015-11-25 (×4): qty 200

## 2015-11-25 MED ORDER — SODIUM CHLORIDE 0.9% FLUSH
3.0000 mL | Freq: Two times a day (BID) | INTRAVENOUS | Status: DC
  Administered 2015-11-26 (×2): 3 mL via INTRAVENOUS

## 2015-11-25 MED ORDER — WARFARIN SODIUM 1 MG PO TABS: 5.0000 mg | ORAL_TABLET | ORAL | Status: DC

## 2015-11-25 MED ORDER — WARFARIN - PHYSICIAN DOSING INPATIENT: Freq: Every day | Status: DC

## 2015-11-25 MED ORDER — WARFARIN SODIUM 4 MG PO TABS: 4.0000 mg | ORAL_TABLET | Freq: Every day | ORAL | Status: DC

## 2015-11-25 MED ORDER — ACETAMINOPHEN 325 MG PO TABS: 650.0000 mg | ORAL_TABLET | Freq: Four times a day (QID) | ORAL | Status: DC | PRN

## 2015-11-25 MED ORDER — BUDESONIDE 0.25 MG/2ML IN SUSP
0.2500 mg | Freq: Two times a day (BID) | RESPIRATORY_TRACT | Status: DC
  Administered 2015-11-25 – 2015-11-26 (×2): 0.25 mg via RESPIRATORY_TRACT
  Filled 2015-11-25 (×3): qty 2

## 2015-11-25 MED ORDER — PANTOPRAZOLE SODIUM 40 MG IV SOLR
40.0000 mg | Freq: Two times a day (BID) | INTRAVENOUS | Status: DC
  Administered 2015-11-26: 40 mg via INTRAVENOUS
  Filled 2015-11-25: qty 40

## 2015-11-25 MED ORDER — FUROSEMIDE 40 MG PO TABS
40.0000 mg | ORAL_TABLET | Freq: Every day | ORAL | Status: DC
  Administered 2015-11-26: 40 mg via ORAL
  Filled 2015-11-25: qty 1

## 2015-11-25 MED ORDER — MORPHINE SULFATE (PF) 2 MG/ML IV SOLN: 2.0000 mg | INTRAVENOUS | Status: DC | PRN

## 2015-11-25 MED ORDER — ONDANSETRON HCL 4 MG/2ML IJ SOLN
4.0000 mg | Freq: Four times a day (QID) | INTRAMUSCULAR | Status: DC | PRN
  Administered 2015-11-26: 4 mg via INTRAVENOUS
  Filled 2015-11-25: qty 2

## 2015-11-25 MED ORDER — AMIODARONE HCL IN DEXTROSE 360-4.14 MG/200ML-% IV SOLN
60.0000 mg/h | INTRAVENOUS | Status: AC
  Administered 2015-11-25 (×2): 60 mg/h via INTRAVENOUS
  Filled 2015-11-25: qty 200

## 2015-11-25 MED ORDER — DOCUSATE SODIUM 100 MG PO CAPS
100.0000 mg | ORAL_CAPSULE | Freq: Two times a day (BID) | ORAL | Status: DC
  Administered 2015-11-26: 100 mg via ORAL
  Filled 2015-11-25: qty 1

## 2015-11-25 MED ORDER — ONDANSETRON HCL 4 MG PO TABS: 4.0000 mg | ORAL_TABLET | Freq: Four times a day (QID) | ORAL | Status: DC | PRN

## 2015-11-25 MED ORDER — FOLIC ACID 1 MG PO TABS
1.0000 mg | ORAL_TABLET | Freq: Every day | ORAL | Status: DC
  Administered 2015-11-26: 1 mg via ORAL
  Filled 2015-11-25: qty 1

## 2015-11-25 MED ORDER — MAGNESIUM SULFATE 2 GM/50ML IV SOLN
2.0000 g | Freq: Once | INTRAVENOUS | Status: AC
  Administered 2015-11-25: 2 g via INTRAVENOUS
  Filled 2015-11-25: qty 50

## 2015-11-25 MED ORDER — ADULT MULTIVITAMIN W/MINERALS CH
1.0000 | ORAL_TABLET | Freq: Every day | ORAL | Status: DC
  Administered 2015-11-26: 1 via ORAL
  Filled 2015-11-25: qty 1

## 2015-11-25 MED ORDER — LOSARTAN POTASSIUM 50 MG PO TABS
100.0000 mg | ORAL_TABLET | Freq: Every day | ORAL | Status: DC
  Administered 2015-11-25 – 2015-11-26 (×2): 100 mg via ORAL
  Filled 2015-11-25: qty 2

## 2015-11-25 MED ORDER — LORATADINE 10 MG PO TABS
10.0000 mg | ORAL_TABLET | Freq: Every day | ORAL | Status: DC
  Administered 2015-11-26: 10 mg via ORAL
  Filled 2015-11-25: qty 1

## 2015-11-25 MED ORDER — SODIUM CHLORIDE 0.9 % IV SOLN
INTRAVENOUS | Status: DC
  Administered 2015-11-25: 100 mL via INTRAVENOUS
  Administered 2015-11-26: 06:00:00 via INTRAVENOUS

## 2015-11-25 MED ORDER — ALBUTEROL SULFATE (2.5 MG/3ML) 0.083% IN NEBU: 3.0000 mL | INHALATION_SOLUTION | Freq: Four times a day (QID) | RESPIRATORY_TRACT | Status: DC | PRN

## 2015-11-25 MED ORDER — FLUTICASONE FUROATE-VILANTEROL 100-25 MCG/INH IN AEPB: 1.0000 | INHALATION_SPRAY | Freq: Every day | RESPIRATORY_TRACT | Status: DC

## 2015-11-25 MED ORDER — AMIODARONE LOAD VIA INFUSION
150.0000 mg | Freq: Once | INTRAVENOUS | Status: AC
Start: 2015-11-25 — End: 2015-11-25
  Administered 2015-11-25: 150 mg via INTRAVENOUS

## 2015-11-25 MED ORDER — EZETIMIBE 10 MG PO TABS
10.0000 mg | ORAL_TABLET | Freq: Every day | ORAL | Status: DC
  Administered 2015-11-26: 10 mg via ORAL

## 2015-11-25 MED ORDER — NITROGLYCERIN 0.4 MG SL SUBL: 0.4000 mg | SUBLINGUAL_TABLET | SUBLINGUAL | Status: DC | PRN

## 2015-11-25 MED ORDER — CARVEDILOL 3.125 MG PO TABS
3.1250 mg | ORAL_TABLET | Freq: Two times a day (BID) | ORAL | Status: DC
  Administered 2015-11-26 (×2): 3.125 mg via ORAL
  Filled 2015-11-25 (×4): qty 1

## 2015-11-25 MED ORDER — ACETAMINOPHEN 650 MG RE SUPP: 650.0000 mg | Freq: Four times a day (QID) | RECTAL | Status: DC | PRN

## 2015-11-25 MED ORDER — LEVOTHYROXINE SODIUM 50 MCG PO TABS
50.0000 ug | ORAL_TABLET | Freq: Every day | ORAL | Status: DC
  Administered 2015-11-26: 50 ug via ORAL
  Filled 2015-11-25: qty 1

## 2015-11-25 MED ORDER — AMIODARONE HCL IN DEXTROSE 360-4.14 MG/200ML-% IV SOLN
INTRAVENOUS | Status: AC
  Filled 2015-11-25: qty 200

## 2015-11-25 MED ORDER — FLUTICASONE PROPIONATE 50 MCG/ACT NA SUSP
2.0000 | Freq: Every day | NASAL | Status: DC | PRN
  Filled 2015-11-25: qty 16

## 2015-11-25 NOTE — ED Notes (Signed)
Patient arrived to [facility] ED via [facility]. Patient is from home living with family, family reported to EMS that patient was unresponsive. EMS states patient was unresponsive, grey, and clammy on their arrival to the residence. EMS reported a sustained VTACH episode upon arrival to the ED which self resolved.

## 2015-11-25 NOTE — ED Provider Notes (Signed)
[facility] Emergency Department Provider Note  ____________________________________________  Time seen: Seen upon arrival to the emergency department  I have reviewed the triage vital signs and the nursing notes.   HISTORY  Chief Complaint Loss of Consciousness   HPI [name] [name] is a 75 y.o. male with a history of atrial fibrillation and a pacemaker on Coumadin who is presenting to the emergency department today for an episode of unresponsiveness. The patient was lying outside with his family when he became unresponsive. EMS was called and upon arrival of EMS the patient was still unresponsive and "gray." However, he did have a pulse was put on the monitor was found to be in sinus rhythm. However, once on the monitor he was found to be in and out of ventricular tachycardia. The patient did become responsive for EMS as well as having his color improved. At this time the patient denies any chest pain or shortness of breath. Denies any preceding symptoms such as chest pain or shortness of breath.  His wife is also here and says that the patient has a DO NOT RESUSCITATE and does not want any life prolonging measures but says that if he goes back into the V. tach that she is okay with him being delivered a shock and having CPR done in the short-term. She also says the patient has a history of hydrocephalus and a shunt placed this past January. The patient is not reporting any headaches at this time.   Past Medical History  Diagnosis Date  . Syncope and collapse     Evaluation by Dr Caryl Comes; Syncope probably neutrally mediated and modest resting sinus bradycardia. and 16 B. episode of SVT that was either A fib or another type of SVT. Patient improved with combination of holding ACE inhibitor or low BP and reducing beta blocker for bradycardia  . Hypertension                                                                                                                                                                                                                                                                                                                                                                                                                        .  Coronary artery disease     a. CABG 2008. b. Relook cath 08/12/08 - all 5 grafts are patent  . Crohn's disease ([place])     Humara Rx in past  . SVT (supraventricular tachycardia) ([place])   . 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 History of benign colon tumor   . Mitral regurgitation     mild - echo 4/09  . Anxiety   . Gait difficulty     with Crestor (but tolerates simva)  . Pulmonary embolism ([place])     2007,b CT scan, IVC fiter placed then because of concern about coumadin use at that time.  . Orthostasis     treated  . Drug therapy     Intermittant steroids   . Warfarin anticoagulation   . Factor VIII     Factor VIII EXCESS.Marland KitchenMarland KitchenDr Gwenlyn Fudge.Marland Kitchenanother reason for coumadin  . Incomplete RBBB   . Drug therapy     question rash diltiazem, and amio  . Ejection fraction     EF 60-65%, echo, March, 2012, moderate diastolic dysfunction  . 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 Melvyn Novas; has chronic shortness of breath  . PAF (paroxysmal atrial fibrillation) ([place])     April, 2013, new diagnosis  . Bradycardia     a. s/p Biotronic pacemaker placement 04/02/13. b. Lead removal/new lead insertion 07/2013 due to dysfunction.  . Carotid artery disease ([place])     Dopplers to be checked, June, 2013  . Cough     October, 2013  . Tremor     October, 2013  . High cholesterol   . OSA (obstructive sleep apnea)     "quit wearing his mask" (07/30/2013)  . GERD (gastroesophageal reflux disease)     Patient Active Problem List   Diagnosis Date Noted  . NPH (normal pressure hydrocephalus) 09/04/2015  . 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 Sepsis secondary to UTI ([place]) 06/05/2015  . Immunosuppression ([place]) 05/01/2015  . Acquired  hypothyroidism 03/08/2015  . Absolute anemia 03/08/2015  . Anxiety 03/08/2015  . Airway hyperreactivity 03/08/2015  . CAD in native artery 03/08/2015  . CCF (congestive cardiac failure) ([place]) 03/08/2015  . Neoplasm of digestive system 03/08/2015  . 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 Deep vein thrombosis ([place]) 03/08/2015  . Dementia of frontal lobe type 03/08/2015  . Elevated diaphragm 03/08/2015  . Acid reflux 03/08/2015  . Borderline diabetes 03/08/2015  . H/O deep venous thrombosis 03/08/2015  . Hypercholesteremia 03/08/2015  . Adiposity 03/08/2015  . Obstructive apnea 03/08/2015  . 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 Kidney failure 03/08/2015  . 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 Irritable colon 03/08/2015  . [place] (subconjunctival hemorrhage) 03/08/2015  . Temporary cerebral vascular dysfunction 03/08/2015  . Diabetes mellitus, type 2 ([place]) 03/08/2015  . Non-fluent aphasia 06/27/2014  . Hyperlipidemia 06/06/2014  . Atherosclerosis of coronary artery 04/26/2014  . Arterial disease ([place]) 04/26/2014  . Essential (primary) hypertension 04/26/2014  . Disorder of mitral valve 04/26/2014  . Other pulmonary embolism and infarction 04/26/2014  . Postsurgical aortocoronary bypass status 04/26/2014  . Pulmonary embolism with infarction ([place]) 04/26/2014  . Hyperlipemia 12/07/2013  . Encounter for therapeutic drug monitoring 09/22/2013  . Bradycardia   . Mechanical complication of other vascular device, implant, and graft 07/30/2013  . Cardiac device, implant, or graft complication [id number]  . Pacemaker 07/13/2013  . Groin hematoma 05/10/2013  . Poor memory 10/28/2012  .  Amnesia 10/28/2012  . Cough   . Tremor   . Atrial fibrillation ([place])   . Carotid artery disease ([place])   . Hx of amiodarone therapy 12/06/2011  . 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 Syncope and collapse   .  Hypertension   . Coronary artery disease   . Mitral regurgitation   . Pulmonary embolism ([place])   . Orthostasis   . Drug therapy   . S/P IVC filter   . Warfarin anticoagulation   . Factor VIII   . Hx of CABG   . Incomplete RBBB   . Drug therapy   . Shortness of breath 10/25/2010  . Crohn's disease ([place]) 01/25/2009    Past Surgical History  Procedure Laterality Date  . Cholecystectomy    . Back surgery    . Cataract extraction w/ intraocular lens  implant, bilateral Bilateral 1990's  . Colonoscopy w/ polypectomy    . Loop monitor  03/2013    "removed w/in 2 days" (07/30/2013)  . Lead revision  07/30/2013    DDD PM leads removed and a new set of PPM leads inserted via the left subclavian vein. [id number]  . Tonsillectomy    . Insert / replace / remove pacemaker  03/2013    [facility]  . Cardiac catheterization  03/2013    [facility]  . Cardiac catheterization  2008; ~ 2011  . Coronary artery bypass graft  4/08    CABG X5  . Lumbar disc surgery  1978; 1986  . Colon surgery      "had a tumor removed" (07/30/2013)  . Vena cava filter placement  2007    PE  . Loop recorder implant N/A 03/24/2013    Procedure: LOOP RECORDER IMPLANT;  Surgeon: Deboraha Sprang, MD;  Location[facility] C[facility] LAB;  Service: Cardiovascular;  Laterality: N/A;  . Permanent pacemaker insertion N/A 04/02/2013    Procedure: PERMANENT PACEMAKER INSERTION;  Surgeon: Evans Lance, MD;  Location: [facility] CATH LAB;  Service: Cardiovascular;  Laterality: N/A;  . Lead revision N/A 07/30/2013    Procedure: LEAD REVISION;  Surgeon: Evans Lance, MD;  Location: [facility] CATH LAB;  Service: Cardiovascular;  Laterality: N/A;  . Colonoscopy N/A 01/13/2015    Procedure: COLONOSCOPY;  Surgeon: Manya Silvas, MD;  Location: [facility] ENDOSCOPY;  Service: Endoscopy;  Laterality: N/A;    Current Outpatient Rx  Name  Route  Sig  Dispense  Refill  . adalimumab (HUMIRA PEN) 40 MG/0.8ML injection   Subcutaneous   Inject 40 mg into the skin every 21 (  twenty-one) days. Tuesday         . amiodarone (PACERONE) 200 MG tablet      TAKE 1/2 TO 1 TABLET DAILY ALTERNATING WITH 100MG    90 tablet   3   . aspirin 81 MG tablet   Oral   Take 81 mg by mouth daily.           Marland Kitchen BREO ELLIPTA 100-25 MCG/INH AEPB      INHALE 1 PUFF DAILY Patient not taking: Reported on 09/04/2015   60 each   3   . carvedilol (COREG) 3.125 MG tablet      TAKE ONE TABLET (3.125MG ) BY MOUTH TWICEDAILY   180 tablet   3     PLEASE SEND REFILLSTHANK YOU   . diphenhydrAMINE (BENADRYL) 25 mg capsule   Oral   Take 25 mg by mouth See admin instructions. Take 25 mg 2 hours prior to Humira         .  docusate sodium (COLACE) 100 MG capsule   Oral   Take 100 mg by mouth daily as needed.          . ezetimibe (ZETIA) 10 MG tablet   Oral   Take 10 mg by mouth daily.         . fluticasone (FLONASE) 50 MCG/ACT nasal spray   Nasal   Place 2 sprays into the nose daily as needed for allergies.          . folic acid (FOLVITE) 1 MG tablet   Oral   Take 1 tablet by mouth daily.          . furosemide (LASIX) 40 MG tablet   Oral   Take 40 mg by mouth.         . hydrocortisone (ANUSOL-HC) 25 MG suppository   Rectal   Place 1 suppository rectally at bedtime as needed. Hemorid      1   . levothyroxine (SYNTHROID, LEVOTHROID) 50 MCG tablet   Oral   Take 50 mcg by mouth daily before breakfast.         . loratadine (CLARITIN) 10 MG tablet   Oral   Take 10 mg by mouth daily.         Marland Kitchen losartan (COZAAR) 100 MG tablet      TAKE ONE TABLET EVERY DAY   90 tablet   3     FOR NEXT FILL. THANK YOU   . Multiple Vitamin (MULTIVITAMIN) tablet   Oral   Take 1 tablet by mouth daily.           . nitroGLYCERIN (NITROSTAT) 0.4 MG SL tablet   Sublingual   Place 0.4 mg under the tongue every 5 (five) minutes as needed for chest pain (x 3 doses daily).         Marland Kitchen omeprazole (PRILOSEC) 40 MG capsule      TAKE 1 CAPSULE BY MOUTH DAILY.   30  capsule   5   . polyethylene glycol (MIRALAX / GLYCOLAX) packet   Oral   Take by mouth.         . predniSONE (DELTASONE) 20 MG tablet   Oral   Take 20 mg by mouth 3 (three) times daily as needed (Start taking 2 days before humira and day of injection).          Marland Kitchen PROAIR HFA 108 (90 BASE) MCG/ACT inhaler   Inhalation   Inhale 2 puffs into the lungs every 6 (six) hours as needed for wheezing or shortness of breath.          . ranitidine (ZANTAC) 150 MG tablet   Oral   Take 150 mg by mouth See admin instructions. Take 2 hrs before Humira         . warfarin (COUMADIN) 5 MG tablet      TAKE 1 TABLET EVERY DAY AS DIRECTED   30 tablet   3     Allergies Balsalazide; Codeine; Diltiazem hcl; Doxycycline; Infliximab; Mercaptopurine; Mesalamine; Methotrexate; Metronidazole; and Pyridostigmine bromide  Family History  Problem Relation Age of Onset  . Family history unknown: Yes    Social History Social History  Substance Use Topics  . Smoking status: Former Smoker -- 0.25 packs/day for 5 years    Types: Cigarettes  . Smokeless tobacco: Never Used     Comment: 07/30/2013 "stopped smoking ~ 1963"  . Alcohol Use: No    Review of Systems Constitutional: No fever/chills Eyes: No visual changes. ENT:  No sore throat. Cardiovascular: Denies chest pain. Respiratory: Denies shortness of breath. Gastrointestinal: No abdominal pain.  No nausea, no vomiting.  No diarrhea.  No constipation. Genitourinary: Negative for dysuria. Musculoskeletal: Negative for back pain. Skin: Negative for rash. Neurological: Negative for headaches, focal weakness or numbness.  10-point ROS otherwise negative.  ____________________________________________   PHYSICAL EXAM:  VITAL SIGNS: ED Triage Vitals  Enc Vitals Group     BP 12/04/2015 1700 150/91 mmHg     Pulse Rate 12/10/2015 1700 66     Resp 11/22/2015 1700 18     Temp 12/06/2015 1700 97.6 F (36.4 C)     Temp Source 11/28/2015 1700 Oral      SpO2 11/17/2015 1700 95 %     Weight 12/09/2015 1700 215 lb (97.523 kg)     Height 11/22/2015 1700 5\' 10"  (1.778 m)     Head Cir --      Peak Flow --      Pain Score --      Pain Loc --      Pain Edu? --      Excl. in [place]? --     Constitutional: Alert and oriented. Well appearing and in no acute distress. Eyes: Conjunctivae are normal. PERRL. EOMI. Head: Atraumatic.Right sided shunt visualized with a palpable and easily compressible reservoir. Nose: No congestion/rhinnorhea. Mouth/Throat: Mucous membranes are moist.  Oropharynx non-erythematous. Neck: No stridor.   Cardiovascular: Normal rate, regular rhythm. Grossly normal heart sounds.  Good peripheral circulation. Respiratory: Normal respiratory effort.  No retractions. Lungs CTAB. Gastrointestinal: Soft and nontender. No distention. No abdominal bruits. No CVA tenderness. Musculoskeletal: No lower extremity tenderness nor edema.  No joint effusions. Neurologic:  No gross focal neurologic deficits are appreciated.  Skin:  Skin is warm, dry and intact. No rash noted. Psychiatric: Mood and affect are normal. Speech and behavior are normal.  ____________________________________________   LABS (all labs ordered are listed, but only abnormal results are displayed)  Labs Reviewed  CBC WITH DIFFERENTIAL/PLATELET  BASIC METABOLIC PANEL  TROPONIN I  PROTIME-INR   ____________________________________________  EKG  ED ECG REPORT I, [place],  Youlanda Roys, the attending physician, personally viewed and interpreted this ECG.   Date: 12/08/2015  EKG Time: 1656  Rate: 63  Rhythm: Atrial paced rhythm. PVC times one.  Axis: Normal axis  Intervals:none  ST&T Change: No ST segment elevation or depression. T-wave inversions in V2 through 6. No significant change from EKG done on 03/14/2015.  ____________________________________________  RADIOLOGY   ____________________________________________   PROCEDURES  CRITICAL  CARE Performed by: Doran Stabler   Total critical care time: 35 minutes  Critical care time was exclusive of separately billable procedures and treating other patients.  Critical care was necessary to treat or prevent imminent or life-threatening deterioration.  Critical care was time spent personally by me on the following activities: development of treatment plan with patient and/or surrogate as well as nursing, discussions with consultants, evaluation of patient's response to treatment, examination of patient, obtaining history from patient or surrogate, ordering and performing treatments and interventions, ordering and review of laboratory studies, ordering and review of radiographic studies, pulse oximetry and re-evaluation of patient's condition.  Reviewed prehospital EKGs which show V. tach. Patient started on amiodarone drip. ____________________________________________   INITIAL IMPRESSION / ASSESSMENT AND PLAN / ED COURSE  Pertinent labs & imaging results that were available during my care of the patient were reviewed by me and considered in my medical decision making (see chart  for details).  ----------------------------------------- 5:28 PM on 11/21/2015 -----------------------------------------  Patient remains without any complaints. I discussed the case with his cardiologist, Dr. [name], who agrees with the amiodarone drip. We do not have any ICU beds here in the [facility]. He recommends transfer to [facility] for further care and intensive care unit admission and possible defibrillator placement. Explained this plan to the patient as well as his wife was at the bedside. They understand the plan and are willing to comply.  ----------------------------------------- 5:32 PM on 11/29/2015 -----------------------------------------  We do have stepdown ICU beds here at [facility] now. The patient will be admitted here to [facility]. Signed out to Dr. Posey Pronto. Made  patient aware of this change in the plan. Pending labs at this time. ____________________________________________   FINAL CLINICAL IMPRESSION(S) / ED DIAGNOSES  Syncope. Ventricular tachycardia.    Orbie Pyo, MD 11/15/2015 [phone]

## 2015-11-25 NOTE — H&P (Signed)
History and Physical    [name] [name] [id number] DOB: Sep 08, 1940 DOA: 11/19/2015  Referring physician: Dr. Clearnce Hasten PCP: [name], MD  Specialists: Dr. [name]  Chief Complaint: unresponsiveness  HPI: [name] [name] is a 75 y.o. 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 EMS was called and pt was found to be in V.Tach. Was brought to the ER and started on Amiodarone drip. Doing better now. More responsive and back in a paced rhythm. The pt is unable to provide hx due his progressive aphasia. He is now admitted.  Review of Systems: unable to obtain due to aphasia  Past Medical History  Diagnosis Date  . Syncope and collapse     Evaluation by Dr Caryl Comes; Syncope probably neutrally mediated and modest resting sinus bradycardia. and 16 B. episode of SVT that was either A fib or another type of SVT. Patient improved with combination of holding ACE inhibitor or low BP and reducing beta blocker for bradycardia  . Hypertension                                                                                                                                                                                                                                                                                                                                                                                                                       . Coronary artery  disease     a. CABG 2008. b. Relook cath 08/12/08 - all 5 grafts are patent  . Crohn's disease ([place])     Humara Rx in past  . SVT (supraventricular tachycardia) ([place])   . 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 History of benign colon tumor   . Mitral regurgitation     mild - echo 4/09  . Anxiety   . Gait difficulty     with Crestor (but tolerates simva)  . Pulmonary embolism ([place])     2007,b CT scan, IVC fiter placed  then because of concern about coumadin use at that time.  . Orthostasis     treated  . Drug therapy     Intermittant steroids   . Warfarin anticoagulation   . Factor VIII     Factor VIII EXCESS.Marland KitchenMarland KitchenDr Gwenlyn Fudge.Marland Kitchenanother reason for coumadin  . Incomplete RBBB   . Drug therapy     question rash diltiazem, and amio  . Ejection fraction     EF 60-65%, echo, March, 2012, moderate diastolic dysfunction  . 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 Melvyn Novas; has chronic shortness of breath  . PAF (paroxysmal atrial fibrillation) ([place])     April, 2013, new diagnosis  . Bradycardia     a. s/p Biotronic pacemaker placement 04/02/13. b. Lead removal/new lead insertion 07/2013 due to dysfunction.  . Carotid artery disease ([place])     Dopplers to be checked, June, 2013  . Cough     October, 2013  . Tremor     October, 2013  . High cholesterol   . OSA (obstructive sleep apnea)     "quit wearing his mask" (07/30/2013)  . GERD (gastroesophageal reflux disease)    Past Surgical History  Procedure Laterality Date  . Cholecystectomy    . Back surgery    . Cataract extraction w/ intraocular lens  implant, bilateral Bilateral 1990's  . Colonoscopy w/ polypectomy    . Loop monitor  03/2013    "removed w/in 2 days" (07/30/2013)  . Lead revision  07/30/2013    DDD PM leads removed and a new set of PPM leads inserted via the left subclavian vein. [id number]  . Tonsillectomy    . Insert / replace / remove pacemaker  03/2013    [facility]  . Cardiac catheterization  03/2013    [facility]  . Cardiac catheterization  2008; ~ 2011  . Coronary artery bypass graft  4/08    CABG X5  . Lumbar disc surgery  1978; 1986  . Colon surgery      "had a tumor removed" (07/30/2013)  . Vena cava filter placement  2007    PE  . Loop recorder implant N/A 03/24/2013    Procedure: LOOP RECORDER IMPLANT;  Surgeon: Deboraha Sprang, MD;  Location: [facility] CATH LAB;  Service: Cardiovascular;  Laterality: N/A;  . Permanent pacemaker insertion N/A 04/02/2013     Procedure: PERMANENT PACEMAKER INSERTION;  Surgeon: Evans Lance, MD;  Location: [facility] CATH LAB;  Service: Cardiovascular;  Laterality: N/A;  . Lead revision N/A 07/30/2013    Procedure: LEAD REVISION;  Surgeon: Evans Lance, MD;  Location: [facility] CATH LAB;  Service: Cardiovascular;  Laterality: N/A;  . Colonoscopy N/A 01/13/2015    Procedure: COLONOSCOPY;  Surgeon: Manya Silvas, MD;  Location: [facility] ENDOSCOPY;  Service: Endoscopy;  Laterality: N/A;   Social History:  reports that he has quit smoking. His smoking use included Cigarettes. He has  a 1.25 pack-year smoking history. He has never used smokeless tobacco. He reports that he does not drink alcohol or use illicit drugs.  Allergies  Allergen Reactions  . Balsalazide     Other reaction(s): Unknown  . Codeine Nausea Only  . Diltiazem Hcl Itching and Rash  . Doxycycline Itching and Rash  . Infliximab Itching and Rash  . Mercaptopurine Itching and Rash  . Mesalamine Itching and Rash  . Methotrexate Itching and Rash  . Metronidazole Itching and Rash  . Pyridostigmine Bromide Nausea Only    Family History  Problem Relation Age of Onset  . Family history unknown: Yes    Prior to Admission medications   Medication Sig Start Date End Date Taking? Authorizing Provider  adalimumab (HUMIRA PEN) 40 MG/0.8ML injection Inject 40 mg into the skin every 21 ( twenty-one) days. Tuesday    Historical Provider, MD  amiodarone (PACERONE) 200 MG tablet TAKE 1/2 TO 1 TABLET DAILY ALTERNATING WITH 100MG  08/23/15   Minna Merritts, MD  aspirin 81 MG tablet Take 81 mg by mouth daily.      Historical Provider, MD  Memory Dance ELLIPTA 100-25 MCG/INH AEPB INHALE 1 PUFF DAILY Patient not taking: Reported on 09/04/2015 01/18/15   Jerrol Banana., MD  carvedilol (COREG) 3.125 MG tablet TAKE ONE TABLET (3.125MG ) BY MOUTH TWICEDAILY 09/04/15   Minna Merritts, MD  diphenhydrAMINE (BENADRYL) 25 mg capsule Take 25 mg by mouth See admin instructions. Take 25 mg 2  hours prior to Humira    Historical Provider, MD  docusate sodium (COLACE) 100 MG capsule Take 100 mg by mouth daily as needed.     Historical Provider, MD  ezetimibe (ZETIA) 10 MG tablet Take 10 mg by mouth daily.    Historical Provider, MD  fluticasone (FLONASE) 50 MCG/ACT nasal spray Place 2 sprays into the nose daily as needed for allergies.     Historical Provider, MD  folic acid (FOLVITE) 1 MG tablet Take 1 tablet by mouth daily.  10/20/10   Historical Provider, MD  furosemide (LASIX) 40 MG tablet Take 40 mg by mouth.    Historical Provider, MD  hydrocortisone (ANUSOL-HC) 25 MG suppository Place 1 suppository rectally at bedtime as needed. Hemorid 11/30/14   Historical Provider, MD  levothyroxine (SYNTHROID, LEVOTHROID) 50 MCG tablet Take 50 mcg by mouth daily before breakfast.    Historical Provider, MD  loratadine (CLARITIN) 10 MG tablet Take 10 mg by mouth daily.    Historical Provider, MD  losartan (COZAAR) 100 MG tablet TAKE ONE TABLET EVERY DAY 08/10/15   Minna Merritts, MD  Multiple Vitamin (MULTIVITAMIN) tablet Take 1 tablet by mouth daily.      Historical Provider, MD  nitroGLYCERIN (NITROSTAT) 0.4 MG SL tablet Place 0.4 mg under the tongue every 5 (five) minutes as needed for chest pain (x 3 doses daily).    Historical Provider, MD  omeprazole (PRILOSEC) 40 MG capsule TAKE 1 CAPSULE BY MOUTH DAILY. 12/26/14   Evans Lance, MD  polyethylene glycol [facility]) packet Take by mouth.    Historical Provider, MD  predniSONE (DELTASONE) 20 MG tablet Take 20 mg by mouth 3 (three) times daily as needed (Start taking 2 days before humira and day of injection).  11/30/13   Historical Provider, MD  PROAIR HFA 108 (90 BASE) MCG/ACT inhaler Inhale 2 puffs into the lungs every 6 (six) hours as needed for wheezing or shortness of breath.  03/14/14   Historical Provider, MD  ranitidine (ZANTAC) 150 MG tablet Take 150 mg by mouth See admin instructions. Take 2 hrs before Humira    Historical  Provider, MD  warfarin (COUMADIN) 5 MG tablet TAKE 1 TABLET EVERY DAY AS DIRECTED 07/25/15   Minna Merritts, MD   Physical Exam: Filed Vitals:   11/21/2015 1700 11/11/2015 1730  BP: 159/93 160/91  Pulse: 70 71  Temp: 97.6 F (36.4 C)   TempSrc: Oral   Resp: 21 20  Height: 5\' 10"  (1.778 m)   Weight: 97.523 kg (215 lb)   SpO2: 97% 96%     General:  No apparent distress, WDWN, /AT  Eyes: PERRL, EOMI, no scleral icterus, conjunctiva clear  ENT: moist oropharynx without lesions or exudates, TM's benign, dentition fair  Neck: supple, no lymphadenopathy. No bruits or thyromegaly  Cardiovascular: regular rate without MRG; 2+ peripheral pulses, no JVD, trace peripheral edema  Respiratory: CTA biL, good air movement without wheezing, rhonchi or crackled, respiratory effort normal  Abdomen: soft, non tender to palpation, positive bowel sounds, no guarding, no rebound, no organomegaly  Skin: no rashes or lesions  Musculoskeletal: normal bulk and tone, no joint swelling  Psychiatric: normal mood and affect, A&OX3  Neurologic: CN 2-12 grossly intact, Motor strength 5/5 in all 4 groups with symmetric DTR's and non-focal sensory exam  Labs on Admission:  Basic Metabolic Panel:  Recent Labs Lab 11/15/2015 1701  NA 131*  K 3.7  CL 99*  CO2 27  GLUCOSE 109*  BUN 23*  CREATININE 1.50*  CALCIUM 8.9   Liver Function Tests: No results for input(s): AST, ALT, ALKPHOS, BILITOT, PROT, ALBUMIN in the last 168 hours. No results for input(s): LIPASE, AMYLASE in the last 168 hours. No results for input(s): AMMONIA in the last 168 hours. CBC:  Recent Labs Lab 11/16/2015 1701  WBC 9.5  NEUTROABS 3.0  HGB 13.8  HCT 40.8  MCV 90.5  PLT 308   Cardiac Enzymes:  Recent Labs Lab 12/01/2015 1701  TROPONINI <0.03    BNP (last 3 results) No results for input(s): BNP in the last 8760 hours.  ProBNP (last 3 results) No results for input(s): PROBNP in the last 8760 hours.  CBG: No  results for input(s): GLUCAP in the last 168 hours.  Radiological Exams on Admission: No results found.  EKG: Independently reviewed.  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 Follow enzymes and order echo. Consult Cardiology. Continue coumadin for now. Monitor labs closely.  Diet: clear liquids Fluids: NS@100  DVT Prophylaxis: coumadin  Code Status: FULL  Family Communication: yes  Disposition Plan: home  Time spent: 55 min

## 2015-11-25 NOTE — Progress Notes (Signed)
eLink Physician-Brief Progress Note Patient Name: [name] [name] DOB: [birth year] MRN: [id number]   Date of Service  12/02/2015  HPI/Events of Note  Unresponsive >. WCT >> resolved spont >> amio gtt ekg-A- paced rhythm, RBBB  H/o aphasia & VP shunt  eICU Interventions  none     Intervention Category Evaluation Type: New Patient Evaluation  ALVA,RAKESH V. 11/16/2015, 10:36 PM

## 2015-11-25 NOTE — ED Notes (Addendum)
Patient currently stable at this time in A-paced rhythm. Patient is A & O however is slow responding to questions and appears lethargic.

## 2015-11-26 ENCOUNTER — Inpatient Hospital Stay (HOSPITAL_COMMUNITY)
Admit: 2015-11-26 | Discharge: 2015-11-26 | Disposition: A | Discharge status: Home / Self Care | Payer: Medicare HMO | Attending: Internal Medicine | Admitting: Internal Medicine

## 2015-11-26 ENCOUNTER — Inpatient Hospital Stay: Payer: Medicare HMO

## 2015-11-26 DIAGNOSIS — R404 Transient alteration of awareness: Secondary | ICD-10-CM | POA: Insufficient documentation

## 2015-11-26 DIAGNOSIS — Z950 Presence of cardiac pacemaker: Secondary | ICD-10-CM

## 2015-11-26 DIAGNOSIS — Z4659 Encounter for fitting and adjustment of other gastrointestinal appliance and device: Secondary | ICD-10-CM

## 2015-11-26 DIAGNOSIS — I629 Nontraumatic intracranial hemorrhage, unspecified: Secondary | ICD-10-CM

## 2015-11-26 DIAGNOSIS — R9431 Abnormal electrocardiogram [ECG] [EKG]: Secondary | ICD-10-CM

## 2015-11-26 DIAGNOSIS — G939 Disorder of brain, unspecified: Secondary | ICD-10-CM

## 2015-11-26 DIAGNOSIS — Z01818 Encounter for other preprocedural examination: Secondary | ICD-10-CM

## 2015-11-26 DIAGNOSIS — I2510 Atherosclerotic heart disease of native coronary artery without angina pectoris: Secondary | ICD-10-CM

## 2015-11-26 DIAGNOSIS — R4189 Other symptoms and signs involving cognitive functions and awareness: Secondary | ICD-10-CM | POA: Insufficient documentation

## 2015-11-26 DIAGNOSIS — Z951 Presence of aortocoronary bypass graft: Secondary | ICD-10-CM | POA: Insufficient documentation

## 2015-11-26 DIAGNOSIS — I472 Ventricular tachycardia: Principal | ICD-10-CM

## 2015-11-26 DIAGNOSIS — R4701 Aphasia: Secondary | ICD-10-CM

## 2015-11-26 DIAGNOSIS — G309 Alzheimer's disease, unspecified: Secondary | ICD-10-CM

## 2015-11-26 LAB — COMPREHENSIVE METABOLIC PANEL
ALBUMIN: 3.9 g/dL (ref 3.5–5.0)
ALT: 51 U/L (ref 17–63)
AST: 34 U/L (ref 15–41)
Alkaline Phosphatase: 55 U/L (ref 38–126)
Anion gap: 7 (ref 5–15)
BILIRUBIN TOTAL: 0.8 mg/dL (ref 0.3–1.2)
BUN: 17 mg/dL (ref 6–20)
CALCIUM: 8.3 mg/dL — AB (ref 8.9–10.3)
CO2: 25 mmol/L (ref 22–32)
CREATININE: 0.97 mg/dL (ref 0.61–1.24)
Chloride: 98 mmol/L — ABNORMAL LOW (ref 101–111)
GFR calc Af Amer: >60 mL/min (ref >60)
GFR calc non Af Amer: >60 mL/min (ref >60)
GLUCOSE: 146 mg/dL — AB (ref 65–99)
Potassium: 3.9 mmol/L (ref 3.5–5.1)
Sodium: 130 mmol/L — ABNORMAL LOW (ref 135–145)
TOTAL PROTEIN: 6.6 g/dL (ref 6.5–8.1)

## 2015-11-26 LAB — BLOOD GAS, ARTERIAL
ALLENS TEST (PASS/FAIL): POSITIVE — AB
Acid-base deficit: 2 mmol/L (ref 0.0–2.0)
BICARBONATE: 23.7 meq/L (ref 21.0–28.0)
FIO2: 40
LHR: 15 {breaths}/min
O2 Saturation: 96.7 %
PEEP/CPAP: 5 cmH2O
Patient temperature: 37
VT: 500 mL
pCO2 arterial: 43 mmHg (ref 32.0–48.0)
pH, Arterial: 7.35 (ref 7.350–7.450)
pO2, Arterial: 92 mmHg (ref 83.0–108.0)

## 2015-11-26 LAB — CBC
HEMATOCRIT: 41.4 % (ref 40.0–52.0)
Hemoglobin: 13.9 g/dL (ref 13.0–18.0)
MCH: 31.1 pg (ref 26.0–34.0)
MCHC: 33.7 g/dL (ref 32.0–36.0)
MCV: 92.5 fL (ref 80.0–100.0)
PLATELETS: 302 10*3/uL (ref 150–440)
RBC: 4.47 MIL/uL (ref 4.40–5.90)
RDW: 16.5 % — AB (ref 11.5–14.5)
WBC: 7.8 10*3/uL (ref 3.8–10.6)

## 2015-11-26 LAB — PROTIME-INR
INR: 2.78
Prothrombin Time: 28.9 seconds — ABNORMAL HIGH (ref 11.4–15.0)

## 2015-11-26 LAB — TROPONIN I: Troponin I: <0.03 ng/mL (ref <0.031)

## 2015-11-26 LAB — TSH: TSH: 4.537 u[IU]/mL — ABNORMAL HIGH (ref 0.350–4.500)

## 2015-11-26 LAB — ECHOCARDIOGRAM COMPLETE
HEIGHTINCHES: 70 in
Weight: 3425.07 oz

## 2015-11-26 MED ORDER — PANTOPRAZOLE SODIUM 40 MG PO TBEC
40.0000 mg | DELAYED_RELEASE_TABLET | Freq: Every day | ORAL | Status: DC
  Administered 2015-11-26: 40 mg via ORAL
  Filled 2015-11-26 (×2): qty 1

## 2015-11-26 MED ORDER — GLYCOPYRROLATE 0.2 MG/ML IJ SOLN: 0.2000 mg | INTRAMUSCULAR | Status: DC | PRN

## 2015-11-26 MED ORDER — HYDRALAZINE HCL 20 MG/ML IJ SOLN
10.0000 mg | Freq: Four times a day (QID) | INTRAMUSCULAR | Status: DC | PRN
  Administered 2015-11-26: 10 mg via INTRAVENOUS
  Filled 2015-11-26: qty 1

## 2015-11-26 MED ORDER — VITAMIN K1 10 MG/ML IJ SOLN
10.0000 mg | Freq: Once | INTRAVENOUS | Status: AC
  Administered 2015-11-26 (×2): 10 mg via INTRAVENOUS
  Filled 2015-11-26: qty 1

## 2015-11-26 MED ORDER — VITAL HIGH PROTEIN PO LIQD: 1000.0000 mL | ORAL | Status: DC

## 2015-11-26 MED ORDER — VITAL 1.0 CAL PO LIQD: 30.0000 mL/h | ORAL | Status: DC

## 2015-11-26 MED ORDER — DEXTROSE 5 % IV SOLN
10.0000 mg/h | INTRAVENOUS | Status: DC
  Administered 2015-11-26: 10 mg/h via INTRAVENOUS
  Filled 2015-11-26: qty 10

## 2015-11-26 MED ORDER — PANTOPRAZOLE SODIUM 40 MG PO TBEC: 40.0000 mg | DELAYED_RELEASE_TABLET | Freq: Every day | ORAL | Status: DC

## 2015-11-26 MED ORDER — MORPHINE BOLUS VIA INFUSION
5.0000 mg | INTRAVENOUS | Status: DC | PRN
  Filled 2015-11-26: qty 20

## 2015-11-26 MED ORDER — NOREPINEPHRINE 4 MG/250ML-% IV SOLN
0.0000 ug/min | INTRAVENOUS | Status: DC
  Administered 2015-11-26: 8 ug/min via INTRAVENOUS
  Filled 2015-11-26: qty 250

## 2015-11-26 MED ORDER — NOREPINEPHRINE 4 MG/250ML-% IV SOLN
INTRAVENOUS | Status: AC
  Administered 2015-11-26: 4 mg
  Filled 2015-11-26: qty 250

## 2015-11-26 MED ORDER — NOREPINEPHRINE BITARTRATE 1 MG/ML IV SOLN: 0.0000 ug/min | INTRAVENOUS | Status: DC

## 2015-11-26 NOTE — Progress Notes (Signed)
Brief Nutrition Note  Received call from diet office regarding not having vital 1.0 tube feeding formula for pt per MD order.  Spoke with Dr. Juanell Fairly to inform of not having formula.  MD agreeable for RD to modifiy order as needed.  Consult received for enteral/tube feeding initiation and management.  Adult Enteral Nutrition Protocol initiated. Full assessment to follow.  Admitting Dx: Syncope and collapse [R55] Ventricular tachycardia (HCC) [I47.2]  Body mass index is 30.72 kg/(m^2).   Labs:   Recent Labs Lab 12/06/2015 1701  0422  NA 131* 130*  K 3.7 3.9  CL 99* 98*  CO2 27 25  BUN 23* 17  CREATININE 1.50* 0.97  CALCIUM 8.9 8.3*  MG 2.1  --   GLUCOSE 109* 146*    [name] [name], [name] [name], [name] [name] (pager) Weekend/On-Call pager [phone])

## 2015-11-26 NOTE — Progress Notes (Signed)
1010. Bolous non-effective. Started on Levophed.

## 2015-11-26 NOTE — Progress Notes (Signed)
*  PRELIMINARY RESULTS* Echocardiogram 2D Echocardiogram has been performed.  [name] [name] 12/01/2015, 10:54 AM

## 2015-11-26 NOTE — Consult Note (Signed)
Cardiology Consultation Note  Patient ID: [name] [name], MRN: [id number], DOB/AGE: [birth year] 76 y.o. 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   The patient was lying outside with his family when he became unresponsive.Wife called EMS, which arrived approximately 10 minutes later.  upon arrival of EMS the patient was still unresponsive and "gray." However, he did have a pulse was put on the monitor was found to be in sinus rhythm.  once on the monitor he was found to be in and out of ventricular tachycardia. The patient did become responsive for EMS. He was stabilized in the emergency room, asked his wife later in the evening at around 11 PM for [name] and his phone. Wife reports he is not quite at his baseline but was doing better.  This morning, was noticed to have  was snoring, noted to have hypoxia 75, was turned IV nurses and went "blue". Code was called, ventilated, placed on the ventilator. Started on low-dose nor epi for pressure support. He has had labile blood pressures ranging from [id number] systolic down to 60 systolic with only small incremental changes of the norepinephrine.   Telemetry reviewed showing paced rhythm, This morning  had no significant arrhythmia for his respiratory arrest.  Currently not on sedation and is unarousable.  He is on amiodarone infusion given ventricular tachycardia seen on by EMS  Other past medical hx reviewed admission to the hospital 10/24/2013 with food poisoning and an ileus, renal failure.  h/o urinary tract infection with sepsis, spent 17 days at [facility].  had respiratory difficulty, low sodium 20 days at [facility] rehabilitation  Prior history of falls History of confusion on prior hospital visits . shuffled gait, problems with speech, tremor. He has seen neurology. MRI showed atrophy Carotid ultrasound June 2013 showing mild carotid arterial disease  Total cholesterol 183, LDL 109, HDL 61 in January 2015  cardiac catheterization 03/23/2013 showing patent grafts x5, LIMA to the LAD, vein graft to the diagonal, vein graft to the RCA, vein graft to the OM1 jump graft to the OM 2, ejection fraction 50%  Previous H/o atrial fibrillation on amiodarone, h/o neurocardiogenic syncope, MRI that showed no acute abnormality. some bradycardia. on amiodarone, weaned off metoprolol  Previous episodes of syncope in the past felt to be neurocardiogenic.  episode of syncope at a restaurant waiting in line for food. He had nausea, lightheadedness, then had syncope. He was on the ground for several minutes before he came to. Workup in the hospital showed sodium of 122, normal echocardiogram, normal telemetry with normal sinus rhythm. It was felt that he could be orthostatic from the start HCTZ earlier that week with a change in his sodium.   Myoview 10/2012  showed no ischemia.  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 CT of the head and MRI did not show any acute  abnormality or stroke  previous echocardiogram showed ejection fraction 50-55%, mild LVH, mildly dilated left atrium, diastolic dysfunction    Past Medical History  Diagnosis Date  . Syncope and collapse      Evaluation by Dr Caryl Comes; Syncope probably neutrally mediated and modest resting sinus bradycardia. and 16 B. episode of SVT that was either A fib or another type of SVT. Patient improved with combination of holding ACE inhibitor or low BP and reducing beta blocker for bradycardia  . Hypertension                                                                                                                                                                                                                                                                                                                                                                                                                       . Coronary artery disease     a. CABG 2008. b. Relook cath 08/12/08 - all 5 grafts are patent  . Crohn's disease ([place])     Humara Rx in past  . SVT (supraventricular tachycardia) ([place])   . 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 History of benign colon tumor   . Mitral regurgitation     mild - echo 4/09  . Anxiety   . Gait difficulty     with Crestor (but tolerates simva)  . Pulmonary embolism ([place])     2007,b CT scan, IVC fiter placed then because of concern  about coumadin use at that time.  . Orthostasis     treated  . Drug therapy     Intermittant steroids   . Warfarin anticoagulation   . Factor VIII     Factor VIII EXCESS.Marland KitchenMarland KitchenDr Gwenlyn Fudge.Marland Kitchenanother reason for coumadin  . Incomplete RBBB   . Drug therapy     question rash diltiazem, and amio  . Ejection fraction     EF 60-65%, echo, March, 2012, moderate diastolic dysfunction  . 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 Melvyn Novas; has chronic shortness of breath  . PAF (paroxysmal atrial fibrillation) ([place])     April, 2013, new diagnosis  . Bradycardia     a. s/p Biotronic pacemaker placement 04/02/13. b. Lead removal/new lead insertion 07/2013 due to dysfunction.  . Carotid artery disease ([place])     Dopplers to be checked, June, 2013  . Cough      October, 2013  . Tremor     October, 2013  . High cholesterol   . OSA (obstructive sleep apnea)     "quit wearing his mask" (07/30/2013)  . GERD (gastroesophageal reflux disease)       Most Recent Cardiac Studies: Echo pending   Surgical History:  Past Surgical History  Procedure Laterality Date  . Cholecystectomy    . Back surgery    . Cataract extraction w/ intraocular lens  implant, bilateral Bilateral 1990's  . Colonoscopy w/ polypectomy    . Loop monitor  03/2013    "removed w/in 2 days" (07/30/2013)  . Lead revision  07/30/2013    DDD PM leads removed and a new set of PPM leads inserted via the left subclavian vein. [id number]  . Tonsillectomy    . Insert / replace / remove pacemaker  03/2013    [facility]  . Cardiac catheterization  03/2013    [facility]  . Cardiac catheterization  2008; ~ 2011  . Coronary artery bypass graft  4/08    CABG X5  . Lumbar disc surgery  1978; 1986  . Colon surgery      "had a tumor removed" (07/30/2013)  . Vena cava filter placement  2007    PE  . Loop recorder implant N/A 03/24/2013    Procedure: LOOP RECORDER IMPLANT;  Surgeon: Deboraha Sprang, MD;  Location: [facility] CATH LAB;  Service: Cardiovascular;  Laterality: N/A;  . Permanent pacemaker insertion N/A 04/02/2013    Procedure: PERMANENT PACEMAKER INSERTION;  Surgeon: Evans Lance, MD;  Location: [facility] CATH LAB;  Service: Cardiovascular;  Laterality: N/A;  . Lead revision N/A 07/30/2013    Procedure: LEAD REVISION;  Surgeon: Evans Lance, MD;  Location: [facility] CATH LAB;  Service: Cardiovascular;  Laterality: N/A;  . Colonoscopy N/A 01/13/2015    Procedure: COLONOSCOPY;  Surgeon: Manya Silvas, MD;  Location: [facility] ENDOSCOPY;  Service: Endoscopy;  Laterality: N/A;     Home Meds: Prior to Admission medications   Medication Sig Start Date End Date Taking? Authorizing Provider  adalimumab (HUMIRA PEN) 40 MG/0.8ML injection Inject 40 mg into the skin every 21 ( twenty-one) days.    Yes Historical Provider, MD   amiodarone (PACERONE) 200 MG tablet TAKE 1/2 TO 1 TABLET DAILY ALTERNATING WITH 100MG  08/23/15  Yes Minna Merritts, MD  aspirin 81 MG tablet Take 81 mg by mouth daily.     Yes Historical Provider, MD  carvedilol (COREG) 3.125 MG tablet TAKE ONE TABLET (3.125MG ) BY MOUTH TWICEDAILY 09/04/15  Yes Minna Merritts, MD  diphenhydrAMINE (  BENADRYL) 25 mg capsule Take 25 mg by mouth See admin instructions. Take 1 capsule by mouth 2 hours prior to Humira. And 1 capsule by mouth daily as needed for allergy or sleep.   Yes Historical Provider, MD  docusate sodium (COLACE) 100 MG capsule Take 100 mg by mouth daily as needed for mild constipation or moderate constipation.    Yes Historical Provider, MD  ezetimibe (ZETIA) 10 MG tablet Take 10 mg by mouth at bedtime.    Yes Historical Provider, MD  fluticasone (FLONASE) 50 MCG/ACT nasal spray Place 2 sprays into the nose daily as needed for allergies.    Yes Historical Provider, MD  fluticasone furoate-vilanterol (BREO ELLIPTA) 100-25 MCG/INH AEPB Inhale 1 puff into the lungs daily as needed (shortness of breath.).   Yes Historical Provider, MD  folic acid (FOLVITE) 1 MG tablet Take 1 tablet by mouth daily.  10/20/10  Yes Historical Provider, MD  furosemide (LASIX) 40 MG tablet Take 40 mg by mouth daily.    Yes Historical Provider, MD  glycopyrrolate (ROBINUL) 1 MG tablet Take 0.5 mg by mouth every morning. 09/04/15  Yes Historical Provider, MD  hydrocortisone (ANUSOL-HC) 25 MG suppository Place 1 suppository rectally at bedtime as needed for hemorrhoids.  11/30/14  Yes Historical Provider, MD  levothyroxine (SYNTHROID, LEVOTHROID) 50 MCG tablet Take 50 mcg by mouth daily before breakfast.   Yes Historical Provider, MD  loratadine (CLARITIN) 10 MG tablet Take 10 mg by mouth See admin instructions. *Take 1 tablet by mouth 2 hours prior to Humira injection only.*   Yes Historical Provider, MD  losartan (COZAAR) 100 MG tablet TAKE ONE TABLET EVERY DAY 08/10/15  Yes  Minna Merritts, MD  Multiple Vitamin (MULTIVITAMIN) tablet Take 1 tablet by mouth daily.     Yes Historical Provider, MD  nitroGLYCERIN (NITROSTAT) 0.4 MG SL tablet Place 0.4 mg under the tongue every 5 (five) minutes as needed for chest pain (x 3 doses daily).   Yes Historical Provider, MD  omeprazole (PRILOSEC) 40 MG capsule TAKE 1 CAPSULE BY MOUTH DAILY. 12/26/14  Yes Evans Lance, MD  polyethylene glycol [facility] / Floria Raveling) packet Take 17 g by mouth daily as needed for mild constipation or moderate constipation.    Yes Historical Provider, MD  predniSONE (DELTASONE) 20 MG tablet Take 30 mg by mouth See admin instructions. *Take 1 tablet by mouth 2 days before and day of Humira injection only.* 11/30/13  Yes Historical Provider, MD  PROAIR HFA 108 (90 BASE) MCG/ACT inhaler Inhale 2 puffs into the lungs every 6 (six) hours as needed for wheezing or shortness of breath.  03/14/14  Yes Historical Provider, MD  ranitidine (ZANTAC) 150 MG tablet Take 150 mg by mouth See admin instructions. Take 1 tablet by mouth 2 hrs before Humira injection only.   Yes Historical Provider, MD  warfarin (COUMADIN) 5 MG tablet TAKE 1 TABLET EVERY DAY AS DIRECTED Patient taking differently: Take 1/2 (2.5mg )  tablet by mouth on Monday, Tuesday, Thursday, Friday, and Sunday. Take 1 tablet by mouth on Wednesday and Saturday. 07/25/15  Yes Minna Merritts, MD    Inpatient Medications:  . budesonide  0.25 mg Nebulization BID   And  . arformoterol  15 mcg Nebulization BID  . aspirin EC  81 mg Oral Daily  . carvedilol  3.125 mg Oral BID WC  . docusate sodium  100 mg Oral BID  . ezetimibe  10 mg Oral Daily  . folic acid  1 mg  Oral Daily  . furosemide  40 mg Oral Daily  . levothyroxine  50 mcg Oral QAC breakfast  . loratadine  10 mg Oral Daily  . losartan  100 mg Oral Daily  . multivitamin with minerals  1 tablet Oral Q supper  . pantoprazole  40 mg Oral QAC breakfast  . sodium chloride flush  3 mL Intravenous Q12H   . warfarin  2.5 mg Oral Once per day on Sun Mon Tue Thu Fri   And  . warfarin  5 mg Oral Once per day on Wed Sat  . Warfarin - Physician Dosing Inpatient   Does not apply q1800   . sodium chloride 100 mL/hr at  0700  . amiodarone 30 mg/hr ( 0259)    Allergies:  Allergies  Allergen Reactions  . Balsalazide     Other reaction(s): Unknown  . Codeine Nausea Only  . Diltiazem Hcl Itching and Rash  . Doxycycline Itching and Rash  . Infliximab Itching and Rash  . Mercaptopurine Itching and Rash  . Mesalamine Itching and Rash  . Methotrexate Itching and Rash  . Metronidazole Itching and Rash  . Pyridostigmine Bromide Nausea Only    Social History   Social History  . Marital Status: Married    Spouse Name: N/A  . Number of Children: N/A  . Years of Education: N/A   Occupational History  . retired    Social History Main Topics  . Smoking status: Former Smoker -- 0.25 packs/day for 5 years    Types: Cigarettes  . Smokeless tobacco: Never Used     Comment: 07/30/2013 "stopped smoking ~ 1963"  . Alcohol Use: No  . Drug Use: No  . Sexual Activity: Not Currently   Other Topics Concern  . Not on file   Social History Narrative     Family History  Problem Relation Age of Onset  . Family history unknown: Yes     Review of Systems: Review of Systems  Unable to perform ROS intubated and sedated   Labs:  Recent Labs   1701 11/23/2015 2151  0422  TROPONINI <0.03 <0.03 <0.03   Lab Results  Component Value Date   WBC 7.8    HGB 13.9    HCT 41.4    MCV 92.5    PLT 302     Recent Labs Lab  0422  NA 130*  K 3.9  CL 98*  CO2 25  BUN 17  CREATININE 0.97  CALCIUM 8.3*  PROT 6.6  BILITOT 0.8  ALKPHOS 55  ALT 51  AST 34  GLUCOSE 146*   Lab Results  Component Value Date   CHOL 121 03/21/2013   HDL 49 03/21/2013   LDLCALC 59 03/21/2013   TRIG 66 03/21/2013     Radiology/Studies:  Chest x-ray  Appropriate position of endotracheal tube. Cardiomegaly with low lung volumes and probable mild pulmonary venous congestion. Right hemidiaphragm elevation with small right pleural effusion and bibasilar atelectasis, similar.   EKG: Atrial paced rhythm, rate 63 bpm  EKG lab work, chest x-ray, echocardiogram reviewed independently by myself  Weights: Filed Weights   12/09/2015 1700 11/29/2015 2108  Weight: 215 lb (97.523 kg) 214 lb 1.1 oz (97.1 kg)     Physical Exam: Blood pressure 188/90, pulse 59, temperature 97.6 F (36.4 C), temperature source Oral, resp. rate 14, height 5\' 10"  (1.778 m), weight 214 lb 1.1 oz (97.1 kg), SpO2 97 %. Body mass index is 30.72 kg/(m^2). General: Well developed, well nourished,  in no acute distress. Head: No pupillary response, no gag reflex, no cough response on deep suctioning Neck: Negative for carotid bruits. JVD not elevated. Lungs: Clear bilaterally to auscultation without wheezes, rales, or rhonchi. Breathing is unlabored. Heart: RRR with S1 S2. No murmurs, rubs, or gallops appreciated. Abdomen: Soft,  non-distended with normoactive bowel sounds. No hepatomegaly. No rebound/guarding. No obvious abdominal masses. Msk:   tone appear normal for age. Extremities: No clubbing or cyanosis. No edema.  Distal pedal pulses are 2+ and equal bilaterally. Neuro: Sedated, intubated,  No facial asymmetry. Unable to test Psych:  intubated     Assessment and Plan:   1. Encephalopathy/mental status change CT scan showing large intracranial hemorrhage, including  area of the brainstem This would explain labile blood pressures, acute respiratory decompensation this morning without witnessed arrhythmia. --He does have a ventricular peritoneal shunt in place, managed at [facility] --Maintained on anticoagulation for factor VIII deficiency/ IVC filter in place ----Current situation is critical, family has indicated they do  not want resuscitation  2. Ventricular tachycardia Doubt this was primary cause of her event yesterday as he was found to be in normal sinus rhythm and relatively unresponsive. VT noticed after he was placed on monitor by EMS. Arrhythmia likely secondary to hypoxia. Hypoxia secondary to decreased respiratory drive likely from intracranial hemorrhage  3. Coronary artery disease, CABG Troponin normal, no significant change in EKG Less likely ischemia,  Echocardiogram with little change compared to previous studies  4. Sick sinus syndrome Pacemaker in place, telemetry showing paced rhythm  5. Hypotension Suspect secondary to intracranial bleed, pressures labile requiring pressor support  Long discussion with family concerning his condition Wife has indicated she does  not want aggressive care   Total encounter time more than 80 minutes  Greater than 50% was spent in counseling and coordination of care with the patient   Signed, Esmond Plants, MD, Ph.D [facility]

## 2015-11-26 NOTE — Progress Notes (Signed)
Met with patient's family (wife, son, daughter and a handful of relatives) to review current diagnostic tests, patient's status, prognosis and goals of care. Briefly, patient was admitted with VT and AMS, became unresponsive during am care, went into respiratory arrest  And was emergently intubated. A STAT CT head revealed a massive intraventricular and parenchymal bleed a significant midline shift and edema. Patient is not on any sedation and has no pupillary reflexes, gag reflex and no DTRs. Patient's wife states that patient did not want to be intubated or resuscitated. He is currently a DNR. Family want's patient to be terminally extubated. End of life care orders placed and patient extubated at 23:06 and expired at 23:28. Family's chaplain present. Support provided.

## 2015-11-26 NOTE — Progress Notes (Signed)
Patient ID: [name] [name], male   DOB: 02/22/1941, 75 y.o.   MRN: [id number] [place] [facility] [place] NAME: [name] [name]    MR#:  [id number]  DATE OF BIRTH:  07/20/1941  SUBJECTIVE:  Code blue was called for pt being unresponsive found by RN, cyanotic No change on rhythm on the monitor Intubated and on the vent  REVIEW OF SYSTEMS:   Review of Systems  Unable to perform ROS: intubated   DRUG ALLERGIES:   Allergies  Allergen Reactions  . Balsalazide     Other reaction(s): Unknown  . Codeine Nausea Only  . Diltiazem Hcl Itching and Rash  . Doxycycline Itching and Rash  . Infliximab Itching and Rash  . Mercaptopurine Itching and Rash  . Mesalamine Itching and Rash  . Methotrexate Itching and Rash  . Metronidazole Itching and Rash  . Pyridostigmine Bromide Nausea Only    VITALS:  Blood pressure 188/90, pulse 59, temperature 97.6 F (36.4 C), temperature source Oral, resp. rate 14, height 5\' 10"  (1.778 m), weight 97.1 kg (214 lb 1.1 oz), SpO2 97 %.  PHYSICAL EXAMINATION:   Physical Exam  GENERAL:  75 y.o.-year-old patient lying in the bed with no acute distress. Critically ill EYES: Pupils equal, round, reactive to light and accommodation. No scleral icterus. Extraocular muscles intact.  HEENT: Head atraumatic, normocephalic. Oropharynx and nasopharynx clear. Intubated and on the vent NECK:  Supple, no jugular venous distention. No thyroid enlargement, no tenderness.  LUNGS: Normal breath sounds bilaterally, no wheezing, rales, rhonchi. No use of accessory muscles of respiration.  CARDIOVASCULAR: S1, S2 normal. No murmurs, rubs, or gallops. CABG scar+ ABDOMEN: Soft, nontender, nondistended. Feeble Bowel sounds. No organomegaly or mass.  EXTREMITIES: No cyanosis, clubbing , + edema b/l.    NEUROLOGIC: unable to assess pt on vent   PSYCHIATRIC:  On vent SKIN: No obvious rash, lesion, or ulcer.   LABORATORY PANEL:  CBC  Recent  Labs Lab  0422  WBC 7.8  HGB 13.9  HCT 41.4  PLT 302    Chemistries   Recent Labs Lab 11/11/2015 1701  0422  NA 131* 130*  K 3.7 3.9  CL 99* 98*  CO2 27 25  GLUCOSE 109* 146*  BUN 23* 17  CREATININE 1.50* 0.97  CALCIUM 8.9 8.3*  MG 2.1  --   AST  --  34  ALT  --  51  ALKPHOS  --  55  BILITOT  --  0.8   Cardiac Enzymes  Recent Labs Lab  0422  TROPONINI <0.03   RADIOLOGY:  No results found. ASSESSMENT AND PLAN:   [name] [name] is a 75 y.o. 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 EMS was called and pt was found to be in V.Tach. Was brought to the ER and started on Amiodarone drip.   1. Acute encephalopathy with unresponsiveness -pt intubated and on the vent -PCCM help appreciated  2. V Tach -on Amiodarone gtt -spoke with dr [name] to see pt -cycle cardiac enzymes -pt has PM  3.h/o chronic afib on coumdain with therapeutic INR  4.malignnat HTN -cont po meds thru NG and prn hydralazine  5. Acute renal failure/hyponatremia -came in with creat 1.5-->0.9 -cont IVF  6.GED IV ppi  Will transfer pt under PCCM service RN tried reaching family..I have been unable to reach this  patient by phone.  A letter is being sent. To reach them at present DVT Prophylaxis: on coumadin with therapeutic INR  TOTAL criticalTIME TAKING CARE OF THIS PATIENT: 40 minutes.  >50% time spent on counselling and coordination of care. D/w dr Juanell Fairly   Note: This dictation was prepared with Dragon dictation along with smaller phrase technology. Any transcriptional errors that result from this process are unintentional.  Ruthia Person M.D on  at 8:16 AM  Between 7am to 6pm - Pager - [phone]  After 6pm go to [email or web address] - password [email or web address] [facility]  Office  [phone]  CC: Primary care physician;  [name], MD

## 2015-11-26 NOTE — Progress Notes (Signed)
1800 More responsive ie moves feet slightly more bilaterally.Grimaces with mouth care. Pupils are 2 and questionblly reactive.Daughter still does not accept mothers declining status.

## 2015-11-26 NOTE — Progress Notes (Signed)
1800 Patient declining. Pupils are fixed at 46mm and nonreactive. No gag,no cough. No startle or corneal reflexes. No response to pain with arms. Pain inflicted to toes cause a hyper reflexive response of leg jerks bilaterally. Wife awaiting daughter to arrive from vacation and see dad

## 2015-11-26 NOTE — Progress Notes (Signed)
I reviewed the CT head films, this appears to show a large intracranial hemorrhage, and the area of the brainstem.  Currently, on physical exam, the patient is unresponsive to verbal or physical stimuli, though he does occasionally move his legs at the knees and hips spontaneously, but not purposefully. His blood pressure is labile and he has been started on Levophed.  On the ventilator. I decreased the patient's respiratory rate from 15-6, he did not appear to breathe over the ventilator. He has no pupillary responses, he has no gag reflex, he has no cough response upon deep suctioning. I could not elicit any deep tendon reflexes, and oculocephalic reflexes is negative. Overall, this appears to be significant for severe encephalopathy, due to intracranial hemorrhage.  I discussed this with the wife at bedside and explained that given her previous conversation that he would not want to be kept alive if he was going to be maintained his current condition, we need to potentially change his goals of care. Patient's wife agreed and his CODE STATUS is now changed to DO NOT RESUSCITATE. We will continue current level of care, without escalation, until further family arrives and we can consider withdrawal at that time.  Ashby Dawes, [name]  

## 2015-11-26 NOTE — Procedures (Signed)
Endotracheal Intubation: Patient required placement of an artificial airway secondary to resp failure.   Consent: Emergent.   Hand washing performed prior to starting the procedure.   Medications administered for sedation prior to procedure: none, pt was unresponsive.   Procedure: A time out procedure was called and correct patient, name, & ID confirmed. Needed supplies and equipment were assembled and checked to include ETT, 10 ml syringe, Glidescope, Mac and Miller blades, suction, oxygen and bag mask valve, end tidal CO2 monitor. Patient was positioned to align the mouth and pharynx to facilitate visualization of the glottis.  Heart rate, SpO2 and blood pressure was continuously monitored during the procedure. Pre-oxygenation was conducted prior to intubation and endotracheal tube was placed through the vocal cords into the trachea.     The artificial airway was placed under direct visualization via glidescope route using a 7.5 ETT on the first attempt.    ETT was secured at 23 cm mark.    Placement was confirmed by auscuitation of lungs with good breath sounds bilaterally and no stomach sounds.  Condensation was noted on endotracheal tube.  Pulse ox %.  CO2 detector in place with appropriate color change.   Complications: None .    Chest radiograph ordered and pending.   NOTE: patient did not require sedation during the procedure and was unresponsive.   Marda Stalker, [name]

## 2015-11-26 NOTE — Progress Notes (Signed)
1200 Down to CT for head scan.

## 2015-11-26 NOTE — Progress Notes (Signed)
37. Turned patient to change bed and patient was unresponsive to sternal rub and shaking. Respiratory arrested. No change in blood pressure or heart rate. No spontanious respirations noted. Bagged with 100% O2 and code called. After a few minutes of bagging patient with 100% O2 , intubated per Dr. Earlean Shawl. Placed on ventilator per RT John.

## 2015-11-26 NOTE — Progress Notes (Signed)
[id number] B/P remains low. Consulted with Dr. Letha Cape. 533ml of NS given.

## 2015-11-26 NOTE — Consult Note (Signed)
[facility] Consultation     ASSESSMENT/PLAN    PULMONARY A: Acute respiratory arrest, with acute respiratory failure. Etiology uncertain, this could be from cardiac causes versus neurological causes. Review of his chest x-ray film shows elevated right-sided diaphragm without evidence of other intrapulmonary pathology. -History of pulmonary embolism, DVT. Given therapeutic INR. PE appears to be less likely. P:   Continue ventilator support. -vap prophylaxis. -We'll check lower extremity Dopplers.  CARDIOVASCULAR A: Coronary artery disease. V. [name] [name]. Hypotension P:  -Cardiology following.  RENAL A:  Will avoid nephrotoxic medications, continue IV fluids.  GASTROINTESTINAL A:  GI prophylaxis with PPI. Place. OG tube, start tube feeds.  HEMATOLOGIC A:  --  INFECTIOUS A: -- P:     ENDOCRINE A:  SSI   NEUROLOGIC A:  Unresponsiveness of uncertain etiology.  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  P:   Will check CT head, consult Neurology re: VP shunt malfunction?   MAJOR EVENTS/TEST RESULTS:   Best Practices  DVT Prophylaxis: on coumadin.  GI Prophylaxis: on PPI.    ---------------------------------------  ---------------------------------------   Name: [name] [name] MRN: [id number] DOB: 1941-06-30    ADMISSION DATE:  12/03/2015 CONSULTATION DATE:     REFERRING MD :  [name] [name]  CHIEF COMPLAINT:  Unresponsive.    HISTORY OF PRESENT ILLNESS:    This consult was called in a stat manner; I was in the ICU when a code blue was called on this patient in Rm 6 in the ICU. The patient had been awake and alert, he was being turned, subsequently he became unresponsive. The patient maintained a pulse during this episode and had a blood pressure, however, he was apneic, his oxygen saturation dropped initially to 60%. He was started on bag-mask ventilation. An oral airway was placed, subsequently, oxygen saturation  is increased almost immediately to 99%. Patient was subsequently intubated (see separate note). On review of the patient's chart and in speaking with the patient's wife who provided the history, the patient has a history of primary progressive aphasia. She has a poor functional status at home, he can ambulate around his home, he can walk up 2 steps to get into his house and he was wife occasionally walk outside the house with a cane. He has a history of pulmonary embolism and DVT, status post IVC filter. He has a history of coronary artery disease status post CABG in 2008. He has had syncopal episode in the past, status post pacemaker placement. He presented to the hospital after he and his wife were sitting, his wife noted that he suddenly became unresponsive, his right arm slumped off the chair. EMS arrived and the patient was apparently in V. tach. He was brought to the hospital, was started on amiodarone.  Currently on discussions with the patient's wife regarding CODE STATUS. She tells me that he would not have wanted to be intubated, however, now that he is intubated. She would like him to remain on the ventilator. She would like [place] to see if he can improve and come off the ventilator, before meals cannot come off in a short amount of time. She does not think she would want him to be maintained on the ventilator. Currently the patient is on a ventilator, he is on no sedation, he responds to painful stimuli.  CXR images and ABG reviewed; ETT adequately positioned; elevated right diaphragm.   PAST MEDICAL HISTORY :  Past Medical History  Diagnosis Date  .  Syncope and collapse     Evaluation by [name] [name]; Syncope probably neutrally mediated and modest resting sinus bradycardia. and 16 B. episode of SVT that was either A fib or another type of SVT. Patient improved with combination of holding ACE inhibitor or low BP and reducing beta blocker for bradycardia  . Hypertension                                                                                                                                                                                                                                                                                                                                                                                                                        . Coronary artery disease     a. CABG 2008. b. Relook cath 08/12/08 - all 5 grafts are patent  . Crohn's disease ([place])     Humara Rx in past  . SVT (supraventricular tachycardia) ([place])   . 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 History of benign colon tumor   . Mitral regurgitation     mild - echo 4/09  . Anxiety   . Gait difficulty     with Crestor (but tolerates simva)  . Pulmonary embolism ([place])     2007,b CT scan, IVC fiter placed then because of concern about coumadin use at that time.  . Orthostasis     treated  . Drug therapy     Intermittant steroids   . Warfarin anticoagulation   .  Factor VIII     Factor VIII EXCESS.Marland KitchenMarland KitchenDr [name] [name].Marland Kitchenanother reason for coumadin  . Incomplete RBBB   . Drug therapy     question rash diltiazem, and amio  . Ejection fraction     EF 60-65%, echo, March, 2012, moderate diastolic dysfunction  . Elevated diaphragm     seen by [name] [name]; has chronic shortness of breath  . PAF (paroxysmal atrial fibrillation) ([place])     April, 2013, new diagnosis  . Bradycardia     a. s/p Biotronic pacemaker placement 04/02/13. b. Lead removal/new lead insertion 07/2013 due to dysfunction.  . Carotid artery disease ([place])     Dopplers to be checked, June, 2013  . Cough     October, 2013  . Tremor     October, 2013  . High cholesterol   . OSA (obstructive sleep apnea)     "quit wearing his mask" (07/30/2013)  . GERD (gastroesophageal reflux disease)    Past Surgical History  Procedure Laterality Date  . Cholecystectomy    . Back surgery    . Cataract extraction w/ intraocular lens  implant,  bilateral Bilateral 1990's  . Colonoscopy w/ polypectomy    . Loop monitor  03/2013    "removed w/in 2 days" (07/30/2013)  . Lead revision  07/30/2013    DDD PM leads removed and a new set of PPM leads inserted via the left subclavian vein. [id number]  . Tonsillectomy    . Insert / replace / remove pacemaker  03/2013    [facility]  . Cardiac catheterization  03/2013    [facility]  . Cardiac catheterization  2008; ~ 2011  . Coronary artery bypass graft  4/08    CABG X5  . Lumbar disc surgery  1978; 1986  . Colon surgery      "had a tumor removed" (07/30/2013)  . Vena cava filter placement  2007    PE  . Loop recorder implant N/A 03/24/2013    Procedure: LOOP RECORDER IMPLANT;  Surgeon: Deboraha Sprang, MD;  Location: [facility] CATH LAB;  Service: Cardiovascular;  Laterality: N/A;  . Permanent pacemaker insertion N/A 04/02/2013    Procedure: PERMANENT PACEMAKER INSERTION;  Surgeon: Evans Lance, MD;  Location: [facility] CATH LAB;  Service: Cardiovascular;  Laterality: N/A;  . Lead revision N/A 07/30/2013    Procedure: LEAD REVISION;  Surgeon: Evans Lance, MD;  Location: [facility] CATH LAB;  Service: Cardiovascular;  Laterality: N/A;  . Colonoscopy N/A 01/13/2015    Procedure: COLONOSCOPY;  Surgeon: Manya Silvas, MD;  Location: [facility] ENDOSCOPY;  Service: Endoscopy;  Laterality: N/A;   Prior to Admission medications   Medication Sig Start Date End Date Taking? Authorizing Provider  adalimumab (HUMIRA PEN) 40 MG/0.8ML injection Inject 40 mg into the skin every 21 ( twenty-one) days.    Yes Historical Provider, MD  amiodarone (PACERONE) 200 MG tablet TAKE 1/2 TO 1 TABLET DAILY ALTERNATING WITH 100MG  08/23/15  Yes Minna Merritts, MD  aspirin 81 MG tablet Take 81 mg by mouth daily.     Yes Historical Provider, MD  carvedilol (COREG) 3.125 MG tablet TAKE ONE TABLET (3.125MG ) BY MOUTH TWICEDAILY 09/04/15  Yes Minna Merritts, MD  diphenhydrAMINE (BENADRYL) 25 mg capsule Take 25 mg by mouth See admin instructions. Take 1 capsule  by mouth 2 hours prior to Humira. And 1 capsule by mouth daily as needed for allergy or sleep.   Yes Historical Provider, MD  docusate sodium (COLACE) 100 MG capsule Take 100 mg  by mouth daily as needed for mild constipation or moderate constipation.    Yes Historical Provider, MD  ezetimibe (ZETIA) 10 MG tablet Take 10 mg by mouth at bedtime.    Yes Historical Provider, MD  fluticasone (FLONASE) 50 MCG/ACT nasal spray Place 2 sprays into the nose daily as needed for allergies.    Yes Historical Provider, MD  fluticasone furoate-vilanterol (BREO ELLIPTA) 100-25 MCG/INH AEPB Inhale 1 puff into the lungs daily as needed (shortness of breath.).   Yes Historical Provider, MD  folic acid (FOLVITE) 1 MG tablet Take 1 tablet by mouth daily.  10/20/10  Yes Historical Provider, MD  furosemide (LASIX) 40 MG tablet Take 40 mg by mouth daily.    Yes Historical Provider, MD  glycopyrrolate (ROBINUL) 1 MG tablet Take 0.5 mg by mouth every morning. 09/04/15  Yes Historical Provider, MD  hydrocortisone (ANUSOL-HC) 25 MG suppository Place 1 suppository rectally at bedtime as needed for hemorrhoids.  11/30/14  Yes Historical Provider, MD  levothyroxine (SYNTHROID, LEVOTHROID) 50 MCG tablet Take 50 mcg by mouth daily before breakfast.   Yes Historical Provider, MD  loratadine (CLARITIN) 10 MG tablet Take 10 mg by mouth See admin instructions. *Take 1 tablet by mouth 2 hours prior to Humira injection only.*   Yes Historical Provider, MD  losartan (COZAAR) 100 MG tablet TAKE ONE TABLET EVERY DAY 08/10/15  Yes Minna Merritts, MD  Multiple Vitamin (MULTIVITAMIN) tablet Take 1 tablet by mouth daily.     Yes Historical Provider, MD  nitroGLYCERIN (NITROSTAT) 0.4 MG SL tablet Place 0.4 mg under the tongue every 5 (five) minutes as needed for chest pain (x 3 doses daily).   Yes Historical Provider, MD  omeprazole (PRILOSEC) 40 MG capsule TAKE 1 CAPSULE BY MOUTH DAILY. 12/26/14  Yes Evans Lance, MD  polyethylene glycol [facility]  / Floria Raveling) packet Take 17 g by mouth daily as needed for mild constipation or moderate constipation.    Yes Historical Provider, MD  predniSONE (DELTASONE) 20 MG tablet Take 30 mg by mouth See admin instructions. *Take 1 tablet by mouth 2 days before and day of Humira injection only.* 11/30/13  Yes Historical Provider, MD  PROAIR HFA 108 (90 BASE) MCG/ACT inhaler Inhale 2 puffs into the lungs every 6 (six) hours as needed for wheezing or shortness of breath.  03/14/14  Yes Historical Provider, MD  ranitidine (ZANTAC) 150 MG tablet Take 150 mg by mouth See admin instructions. Take 1 tablet by mouth 2 hrs before Humira injection only.   Yes Historical Provider, MD  warfarin (COUMADIN) 5 MG tablet TAKE 1 TABLET EVERY DAY AS DIRECTED Patient taking differently: Take 1/2 (2.5mg )  tablet by mouth on Monday, Tuesday, Thursday, Friday, and Sunday. Take 1 tablet by mouth on Wednesday and Saturday. 07/25/15  Yes Minna Merritts, MD   Allergies  Allergen Reactions  . Balsalazide     Other reaction(s): Unknown  . Codeine Nausea Only  . Diltiazem Hcl Itching and Rash  . Doxycycline Itching and Rash  . Infliximab Itching and Rash  . Mercaptopurine Itching and Rash  . Mesalamine Itching and Rash  . Methotrexate Itching and Rash  . Metronidazole Itching and Rash  . Pyridostigmine Bromide Nausea Only    FAMILY HISTORY:  Family History  Problem Relation Age of Onset  . Family history unknown: Yes   SOCIAL HISTORY:  reports that he has quit smoking. His smoking use included Cigarettes. He has a 1.25 pack-year smoking history. He has never  used smokeless tobacco. He reports that he does not drink alcohol or use illicit drugs.  REVIEW OF SYSTEMS:   Cannot provide a review of systems is on a ventilator currently.   VITAL SIGNS: Temp:  [97.6 F (36.4 C)] 97.6 F (36.4 C) (04/16 0700) Pulse Rate:  [58-80] 78 (04/16 1100) Resp:  [14-24] 15 (04/16 1100) BP: (70-209)/(47-112) 194/98 mmHg (04/16  1100) SpO2:  [72 %-98 %] 98 % (04/16 1100) FiO2 (%):  [40 %] 40 % (04/16 0940) Weight:  [214 lb 1.1 oz (97.1 kg)-215 lb (97.523 kg)] 214 lb 1.1 oz (97.1 kg) (04/15 2108) HEMODYNAMICS:   VENTILATOR SETTINGS: Vent Mode:  [-] PRVC FiO2 (%):  [40 %] 40 % Set Rate:  [15 bmp] 15 bmp Vt Set:  [500 mL] 500 mL PEEP:  [5 cmH20] 5 cmH20 INTAKE / OUTPUT:  Intake/Output Summary (Last 24 hours) at  1115 Last data filed at  0700  Gross per 24 hour  Intake 229.27 ml  Output    200 ml  Net  29.27 ml    Physical Examination:   VS: BP 194/98 mmHg  Pulse 78  Temp(Src) 97.6 F (36.4 C) (Oral)  Resp 15  Ht 5\' 10"  (1.778 m)  Wt 214 lb 1.1 oz (97.1 kg)  BMI 30.72 kg/m2  SpO2 98%  General Appearance: No distress  Neuro:without focal findings, mental status reduced. Marland Kitchen HEENT: PERRLA, EOM intact, no ptosis, no other lesions noticed;  Pulmonary: normal breath sounds., diaphragmatic excursion normal. CardiovascularNormal S1,S2.  No m/r/g.    Abdomen: Benign, Soft, non-tender, No masses, hepatosplenomegaly, No lymphadenopathy Renal:  No costovertebral tenderness  GU:  Not performed at this time. Endoc: No evident thyromegaly, no signs of acromegaly. Skin:   warm, no rashes, no ecchymosis  Extremities: normal, no cyanosis, clubbing, no edema, warm with normal capillary refill.    LABS: Reviewed   LABORATORY PANEL:   CBC  Recent Labs Lab  0422  WBC 7.8  HGB 13.9  HCT 41.4  PLT 302    Chemistries   Recent Labs Lab 12/04/2015 1701  0422  NA 131* 130*  K 3.7 3.9  CL 99* 98*  CO2 27 25  GLUCOSE 109* 146*  BUN 23* 17  CREATININE 1.50* 0.97  CALCIUM 8.9 8.3*  MG 2.1  --   AST  --  34  ALT  --  51  ALKPHOS  --  55  BILITOT  --  0.8     Recent Labs Lab 11/24/2015 2152  GLUCAP 103*    Recent Labs Lab  1030  PHART 7.35  PCO2ART 43  PO2ART 92    Recent Labs Lab  0422  AST 34  ALT 51  ALKPHOS 55  BILITOT 0.8   ALBUMIN 3.9    Cardiac Enzymes  Recent Labs Lab  0925  TROPONINI <0.03    RADIOLOGY:  Dg Chest Port 1 View    CLINICAL DATA:  Post intubation EXAM: PORTABLE CHEST 1 VIEW COMPARISON:  02/21/2014 FINDINGS: Endotracheal tube terminates 2.8 cm above carina.Nasogastric tube extends beyond the inferior aspect of the film. Pacer with leads at right atrium and right ventricle. No lead discontinuity. Prior median sternotomy. Right-sided VP shunt catheter. Cardiomegaly accentuated by AP portable technique. Moderate right hemidiaphragm elevation. Small right pleural effusion is similar. No pneumothorax. Low lung volumes, accentuated pulmonary vascularity. Mild pulmonary venous congestion suspected. Similar patchy bibasilar airspace disease. IMPRESSION: Appropriate position of endotracheal tube. Cardiomegaly with low lung volumes and probable mild pulmonary venous  congestion. Right hemidiaphragm elevation with small right pleural effusion and bibasilar atelectasis, similar. Electronically Signed   By: Abigail Miyamoto [name]   On:  10:24   Dg Abd Portable 1v    CLINICAL DATA:  Enteric tube placement EXAM: PORTABLE ABDOMEN - 1 VIEW COMPARISON:  10/26/2013 abdominal radiograph FINDINGS: Enteric tube loops within the body of the stomach with the tip in the mid body of the stomach. Gaseous distention of a bowel loop in the left abdomen, favor distal colon. IVC filter is seen in the medial right abdomen. Metallic clips overlie the right abdomen. No evidence of pneumatosis or pneumoperitoneum. IMPRESSION: Enteric tube loops within the body of the stomach with the tip in the mid body of the stomach. Electronically Signed   By: Ilona Sorrel [name]   On:  10:23       --Marda Stalker, MD.  Board Certified in Internal Medicine, Pulmonary Medicine, [place], and Sleep Medicine.  ICU Pager [phone] [facility] Office Number:  [phone]  Patricia Pesa, [name]  Vilinda Boehringer, [name]  Merton Border, M.D   , 11:15 AM   Critical Care Attestation.  I have personally obtained a history, examined the patient, evaluated laboratory and imaging results, formulated the assessment and plan and placed orders. The Patient requires high complexity decision making for assessment and support, frequent evaluation and titration of therapies, application of advanced monitoring technologies and extensive interpretation of multiple databases. The patient has critical illness that could lead imminently to failure of 1 or more organ systems and requires the highest level of physician preparedness to intervene.  Critical Care Time devoted to patient care services described in this note is 45 minutes and is exclusive of time spent in procedures.

## 2015-12-04 ENCOUNTER — Ambulatory Visit: Payer: Medicare HMO | Admitting: Family Medicine

## 2015-12-06 ENCOUNTER — Ambulatory Visit: Payer: Medicare HMO | Admitting: Cardiovascular Disease

## 2015-12-08 LAB — CUP PACEART REMOTE DEVICE CHECK
Brady Statistic RA Percent Paced: 89 %
Date Time Interrogation Session: 20170428163548
Implantable Lead Implant Date: 20141219
Implantable Lead Location: 753860
Implantable Lead Model: 5076
Lead Channel Impedance Value: 527 Ohm
Lead Channel Pacing Threshold Pulse Width: 0.4 ms
Lead Channel Sensing Intrinsic Amplitude: 9.8 mV
Lead Channel Setting Pacing Amplitude: 1.9 V
MDC IDC LEAD IMPLANT DT: 20140822
MDC IDC LEAD LOCATION: 753859
MDC IDC MSMT LEADCHNL RA IMPEDANCE VALUE: 468 Ohm
MDC IDC MSMT LEADCHNL RA PACING THRESHOLD AMPLITUDE: 0.9 V
MDC IDC MSMT LEADCHNL RA PACING THRESHOLD PULSEWIDTH: 0.4 ms
MDC IDC MSMT LEADCHNL RA SENSING INTR AMPL: 3.8 mV
MDC IDC MSMT LEADCHNL RV PACING THRESHOLD AMPLITUDE: 1.2 V
MDC IDC SET LEADCHNL RV PACING AMPLITUDE: 1.9 V
MDC IDC SET LEADCHNL RV PACING PULSEWIDTH: 0.4 ms
MDC IDC STAT BRADY RV PERCENT PACED: 0 %
Pulse Gen Serial Number: 68066723

## 2015-12-11 NOTE — Progress Notes (Signed)
Family made the decision to progress towards comfort care. Morphine drip was initiated, then a short time after the pt was extubated. With family and minister at bedside pt passed away at Dec 11, 2326. This RN and Farris Has, RN pronounced his death.

## 2015-12-11 DEATH — deceased

## 2015-12-23 NOTE — Discharge Summary (Signed)
[facility] Consultation    Name: DOROTHY LANDGREBE MRN: [id number] DOB: [birth year]    ADMISSION DATE:  12/10/2015 CONSULTATION DATE:     REFERRING MD :  Dr. Posey Pronto  CHIEF COMPLAINT:  Unresponsive.    HISTORY OF PRESENT ILLNESS:    This consult was called in a stat manner; I was in the ICU when a code blue was called on this patient in Rm 6 in the ICU. The patient had been awake and alert, he was being turned, subsequently he became unresponsive. The patient maintained a pulse during this episode and had a blood pressure, however, he was apneic, his oxygen saturation dropped initially to 60%. He was started on bag-mask ventilation. An oral airway was placed, subsequently, oxygen saturation is increased almost immediately to 99%. Patient was subsequently intubated (see separate note). On review of the patient's chart and in speaking with the patient's wife who provided the history, the patient has a history of primary progressive aphasia. She has a poor functional status at home, he can ambulate around his home, he can walk up 2 steps to get into his house and he was wife occasionally walk outside the house with a cane. He has a history of pulmonary embolism and DVT, status post IVC filter. He has a history of coronary artery disease status post CABG in 2008. He has had syncopal episode in the past, status post pacemaker placement. He presented to the hospital after he and his wife were sitting, his wife noted that he suddenly became unresponsive, his right arm slumped off the chair. EMS arrived and the patient was apparently in V. tach. He was brought to the hospital, was started on amiodarone.  Currently on discussions with the patient's wife regarding CODE STATUS. She tells me that he would not have wanted to be intubated, however, now that he is intubated. She would like him to remain on the ventilator. She would like [place] to see if he can improve and come off the  ventilator, before meals cannot come off in a short amount of time. She does not think she would want him to be maintained on the ventilator. Currently the patient is on a ventilator, he is on no sedation, he responds to painful stimuli.  CXR images and ABG reviewed; ETT adequately positioned; elevated right diaphragm.   PAST MEDICAL HISTORY :  Past Medical History  Diagnosis Date  . Syncope and collapse     Evaluation by Dr Caryl Comes; Syncope probably neutrally mediated and modest resting sinus bradycardia. and 16 B. episode of SVT that was either A fib or another type of SVT. Patient improved with combination of holding ACE inhibitor or low BP and reducing beta blocker for bradycardia  . Hypertension                                                                                                                                                                                                                                                                                                                                                                                                                       .  Coronary artery disease     a. CABG 2008. b. Relook cath 08/12/08 - all 5 grafts are patent  . Crohn's disease ([place])     Humara Rx in past  . SVT (supraventricular tachycardia) ([place])   . 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 History of benign colon tumor   . Mitral regurgitation     mild - echo 4/09  . Anxiety   . Gait difficulty     with Crestor (but tolerates simva)  . Pulmonary embolism ([place])     2007,b CT scan, IVC fiter placed then because of concern about coumadin use at that time.  . Orthostasis     treated  . Drug therapy     Intermittant steroids   . Warfarin anticoagulation   . Factor VIII     Factor VIII EXCESS.Marland KitchenMarland KitchenDr Gwenlyn Fudge.Marland Kitchenanother reason for coumadin  . Incomplete RBBB   . Drug therapy     question rash diltiazem, and amio  . Ejection fraction     EF 60-65%, echo, March,  2012, moderate diastolic dysfunction  . 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 Melvyn Novas; has chronic shortness of breath  . PAF (paroxysmal atrial fibrillation) ([place])     April, 2013, new diagnosis  . Bradycardia     a. s/p Biotronic pacemaker placement 04/02/13. b. Lead removal/new lead insertion 07/2013 due to dysfunction.  . Carotid artery disease ([place])     Dopplers to be checked, June, 2013  . Cough     October, 2013  . Tremor     October, 2013  . High cholesterol   . OSA (obstructive sleep apnea)     "quit wearing his mask" (07/30/2013)  . GERD (gastroesophageal reflux disease)    Past Surgical History  Procedure Laterality Date  . Cholecystectomy    . Back surgery    . Cataract extraction w/ intraocular lens  implant, bilateral Bilateral 1990's  . Colonoscopy w/ polypectomy    . Loop monitor  03/2013    "removed w/in 2 days" (07/30/2013)  . Lead revision  07/30/2013    DDD PM leads removed and a new set of PPM leads inserted via the left subclavian vein. [id number]  . Tonsillectomy    . Insert / replace / remove pacemaker  03/2013    [facility]  . Cardiac catheterization  03/2013    [facility]  . Cardiac catheterization  2008; ~ 2011  . Coronary artery bypass graft  4/08    CABG X5  . Lumbar disc surgery  1978; 1986  . Colon surgery      "had a tumor removed" (07/30/2013)  . Vena cava filter placement  2007    PE  . Loop recorder implant N/A 03/24/2013    Procedure: LOOP RECORDER IMPLANT;  Surgeon: Deboraha Sprang, MD;  Location: [facility] CATH LAB;  Service: Cardiovascular;  Laterality: N/A;  . Permanent pacemaker insertion N/A 04/02/2013    Procedure: PERMANENT PACEMAKER INSERTION;  Surgeon: Evans Lance, MD;  Location: [facility] CATH LAB;  Service: Cardiovascular;  Laterality: N/A;  . Lead revision N/A 07/30/2013    Procedure: LEAD REVISION;  Surgeon: Evans Lance, MD;  Location: [facility] CATH LAB;  Service: Cardiovascular;  Laterality: N/A;  . Colonoscopy N/A 01/13/2015    Procedure: COLONOSCOPY;  Surgeon:  Manya Silvas, MD;  Location[facility] ENDOSCOPY;  Service: Endoscopy;  Laterality: N/A;   Prior to Admission medications   Medication Sig Start Date End Date Taking? Authorizing  Provider  adalimumab (HUMIRA PEN) 40 MG/0.8ML injection Inject 40 mg into the skin every 21 ( twenty-one) days.    Yes Historical Provider, MD  amiodarone (PACERONE) 200 MG tablet TAKE 1/2 TO 1 TABLET DAILY ALTERNATING WITH 100MG 08/23/15  Yes Minna Merritts, MD  aspirin 81 MG tablet Take 81 mg by mouth daily.     Yes Historical Provider, MD  carvedilol (COREG) 3.125 MG tablet TAKE ONE TABLET (3.125MG) BY MOUTH TWICEDAILY 09/04/15  Yes Minna Merritts, MD  diphenhydrAMINE (BENADRYL) 25 mg capsule Take 25 mg by mouth See admin instructions. Take 1 capsule by mouth 2 hours prior to Humira. And 1 capsule by mouth daily as needed for allergy or sleep.   Yes Historical Provider, MD  docusate sodium (COLACE) 100 MG capsule Take 100 mg by mouth daily as needed for mild constipation or moderate constipation.    Yes Historical Provider, MD  ezetimibe (ZETIA) 10 MG tablet Take 10 mg by mouth at bedtime.    Yes Historical Provider, MD  fluticasone (FLONASE) 50 MCG/ACT nasal spray Place 2 sprays into the nose daily as needed for allergies.    Yes Historical Provider, MD  fluticasone furoate-vilanterol (BREO ELLIPTA) 100-25 MCG/INH AEPB Inhale 1 puff into the lungs daily as needed (shortness of breath.).   Yes Historical Provider, MD  folic acid (FOLVITE) 1 MG tablet Take 1 tablet by mouth daily.  10/20/10  Yes Historical Provider, MD  furosemide (LASIX) 40 MG tablet Take 40 mg by mouth daily.    Yes Historical Provider, MD  glycopyrrolate (ROBINUL) 1 MG tablet Take 0.5 mg by mouth every morning. 09/04/15  Yes Historical Provider, MD  hydrocortisone (ANUSOL-HC) 25 MG suppository Place 1 suppository rectally at bedtime as needed for hemorrhoids.  11/30/14  Yes Historical Provider, MD  levothyroxine (SYNTHROID, LEVOTHROID) 50 MCG tablet Take  50 mcg by mouth daily before breakfast.   Yes Historical Provider, MD  loratadine (CLARITIN) 10 MG tablet Take 10 mg by mouth See admin instructions. *Take 1 tablet by mouth 2 hours prior to Humira injection only.*   Yes Historical Provider, MD  losartan (COZAAR) 100 MG tablet TAKE ONE TABLET EVERY DAY 08/10/15  Yes Minna Merritts, MD  Multiple Vitamin (MULTIVITAMIN) tablet Take 1 tablet by mouth daily.     Yes Historical Provider, MD  nitroGLYCERIN (NITROSTAT) 0.4 MG SL tablet Place 0.4 mg under the tongue every 5 (five) minutes as needed for chest pain (x 3 doses daily).   Yes Historical Provider, MD  omeprazole (PRILOSEC) 40 MG capsule TAKE 1 CAPSULE BY MOUTH DAILY. 12/26/14  Yes Evans Lance, MD  polyethylene glycol [facility] Floria Raveling) packet Take 17 g by mouth daily as needed for mild constipation or moderate constipation.    Yes Historical Provider, MD  predniSONE (DELTASONE) 20 MG tablet Take 30 mg by mouth See admin instructions. *Take 1 tablet by mouth 2 days before and day of Humira injection only.* 11/30/13  Yes Historical Provider, MD  PROAIR HFA 108 (90 BASE) MCG/ACT inhaler Inhale 2 puffs into the lungs every 6 (six) hours as needed for wheezing or shortness of breath.  03/14/14  Yes Historical Provider, MD  ranitidine (ZANTAC) 150 MG tablet Take 150 mg by mouth See admin instructions. Take 1 tablet by mouth 2 hrs before Humira injection only.   Yes Historical Provider, MD  warfarin (COUMADIN) 5 MG tablet TAKE 1 TABLET EVERY DAY AS DIRECTED Patient taking differently: Take 1/2 (2.39m)  tablet by mouth  on Monday, Tuesday, Thursday, Friday, and Sunday. Take 1 tablet by mouth on Wednesday and Saturday. 07/25/15  Yes Minna Merritts, MD   Allergies  Allergen Reactions  . Balsalazide     Other reaction(s): Unknown  . Codeine Nausea Only  . Diltiazem Hcl Itching and Rash  . Doxycycline Itching and Rash  . Infliximab Itching and Rash  . Mercaptopurine Itching and Rash  . Mesalamine  Itching and Rash  . Methotrexate Itching and Rash  . Metronidazole Itching and Rash  . Pyridostigmine Bromide Nausea Only    FAMILY HISTORY:  Family History  Problem Relation Age of Onset  . Family history unknown: Yes   SOCIAL HISTORY:  reports that he has quit smoking. His smoking use included Cigarettes. He has a 1.25 pack-year smoking history. He has never used smokeless tobacco. He reports that he does not drink alcohol or use illicit drugs.  REVIEW OF SYSTEMS:   Cannot provide a review of systems is on a ventilator currently.   VITAL SIGNS:   HEMODYNAMICS:   VENTILATOR SETTINGS:   INTAKE / OUTPUT: No intake or output data in the 24 hours ending 12/23/15 2349  Physical Examination:   VS: BP 99/80 mmHg  Pulse 66  Temp(Src) 97 F (36.1 C) (Oral)  Resp 0  Ht 5' 10"  (1.778 m)  Wt 214 lb 1.1 oz (97.1 kg)  BMI 30.72 kg/m2  SpO2 99%  General Appearance: No distress  Neuro:without focal findings, mental status reduced. Marland Kitchen HEENT: PERRLA, EOM intact, no ptosis, no other lesions noticed;  Pulmonary: normal breath sounds., diaphragmatic excursion normal. CardiovascularNormal S1,S2.  No m/r/g.    Abdomen: Benign, Soft, non-tender, No masses, hepatosplenomegaly, No lymphadenopathy Renal:  No costovertebral tenderness  GU:  Not performed at this time. Endoc: No evident thyromegaly, no signs of acromegaly. Skin:   warm, no rashes, no ecchymosis  Extremities: normal, no cyanosis, clubbing, no edema, warm with normal capillary refill.    LABS: Reviewed   LABORATORY PANEL:   CBC No results for input(s): WBC, HGB, HCT, PLT in the last 168 hours.  Chemistries  No results for input(s): NA, K, CL, CO2, GLUCOSE, BUN, CREATININE, CALCIUM, MG, PHOS, AST, ALT, ALKPHOS, BILITOT in the last 168 hours.  Invalid input(s): GFRCGP  No results for input(s): GLUCAP in the last 168 hours. No results for input(s): PHART, PCO2ART, PO2ART in the last 168 hours. No results for  input(s): AST, ALT, ALKPHOS, BILITOT, ALBUMIN in the last 168 hours.  Cardiac Enzymes No results for input(s): TROPONINI in the last 168 hours.   Hospital course Met with patient's family (wife, son, daughter and a handful of relatives) to review current diagnostic tests, patient's status, prognosis and goals of care. Briefly, patient was admitted with VT and AMS, became unresponsive during am care, went into respiratory arrest And was emergently intubated. A STAT CT head revealed a massive intraventricular and parenchymal bleed a significant midline shift and edema. Patient is not on any sedation and has no pupillary reflexes, gag reflex and no DTRs. Patient's wife states that patient did not want to be intubated or resuscitated. He is currently a DNR. Family want's patient to be terminally extubated.   Admission diagnosis Acute respiratory failure Hypotension AMS Massive ICH  Discharge diagnosis Acute respiratory failure Hypotension AMS Massive ICH  Discharge plan End of life care orders placed and patient extubated at 23:06 and expired at 23:28. Family's chaplain present. Support provided.   Vanya Carberry S. Tukov ANP-BC Pulmonary and Critical Care  [place] Pager [phone] or [phone]

## 2016-05-26 IMAGING — CT CT HEAD W/O CM
2 series · 15 of 30 positions shown, 19 images · non-contrast
Comparison: 04/10/2015

CLINICAL DATA: Unresponsive.  Code.

EXAM:
CT HEAD WITHOUT CONTRAST
TECHNIQUE: Contiguous axial images were obtained from the base of the skull
through the vertex without intravenous contrast.

[Series 2: head wo · axial · 0.45mm/px · z∈[-177,-47]mm · 13 of 32 slices shown, 17 images (1 of 2)]
[im 3/32  brain]
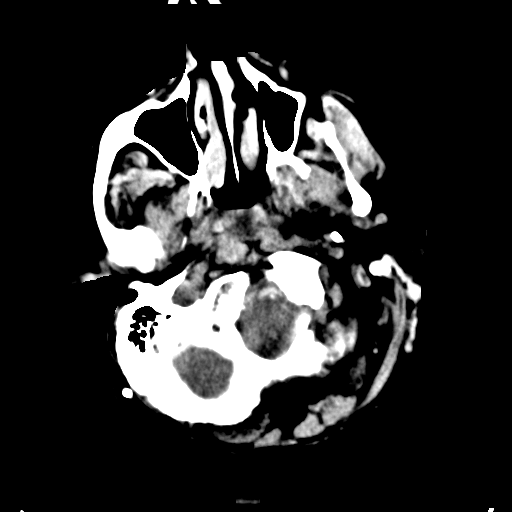
[im 3/32  bone]
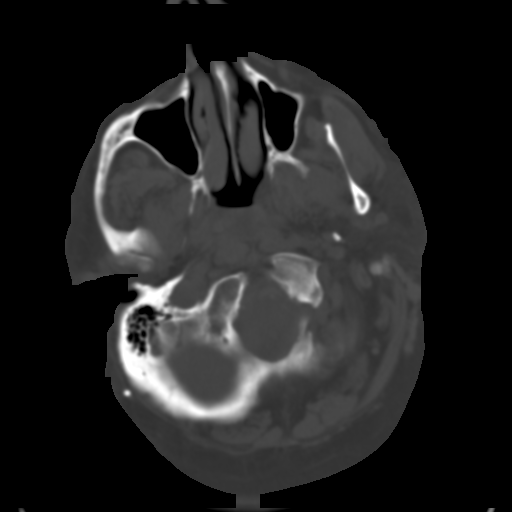
[im 5/32  brain]
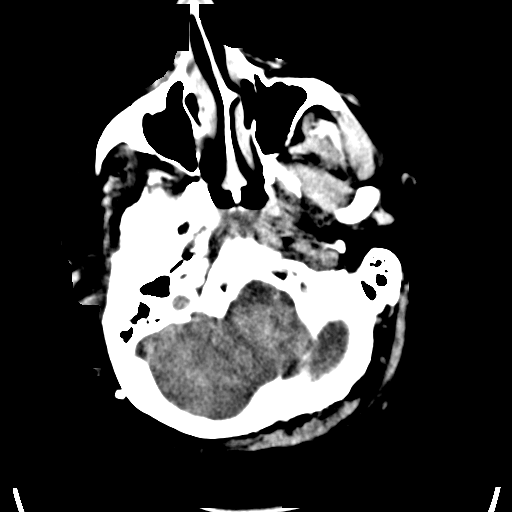
[im 7/32  brain]
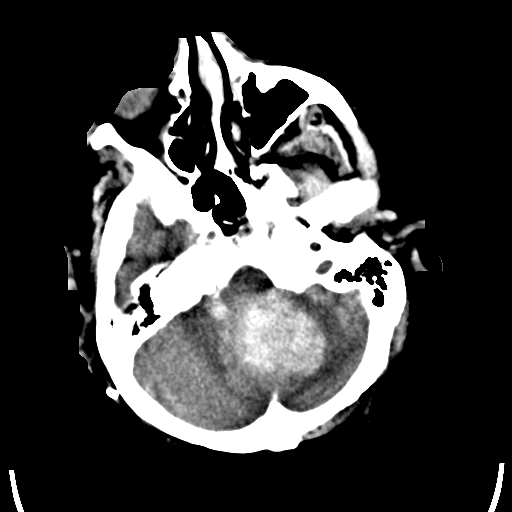
[im 9/32  brain]
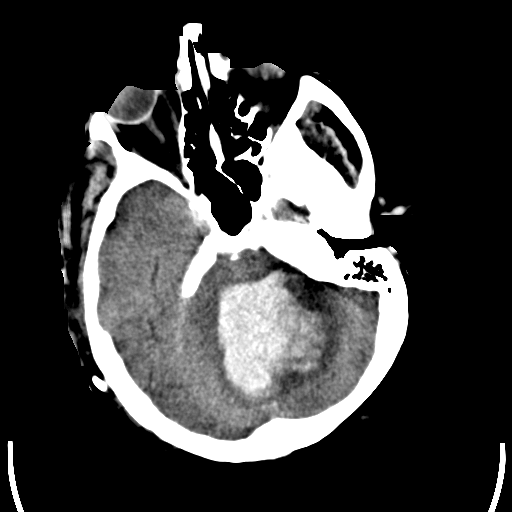
[im 12/32  brain]
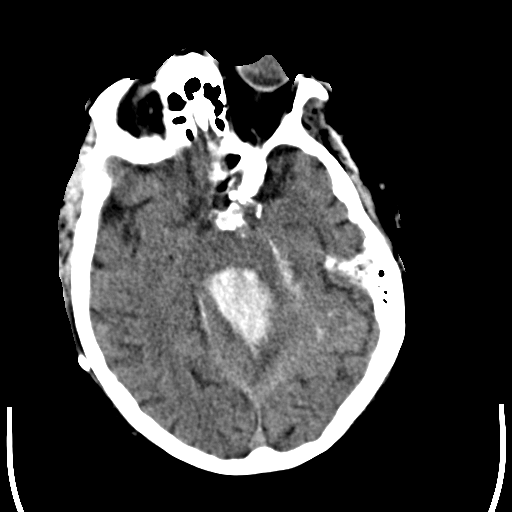
[im 12/32  bone]
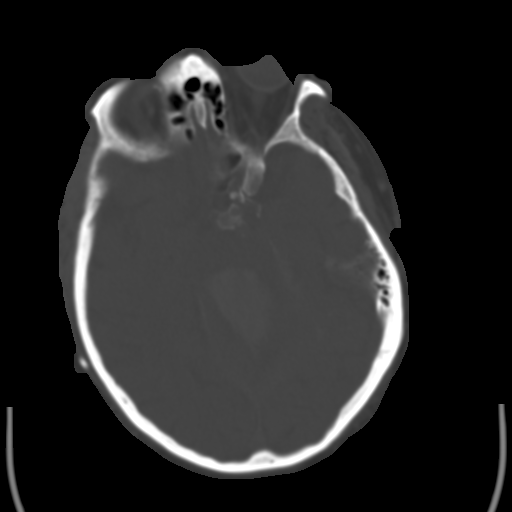
[im 14/32  brain]
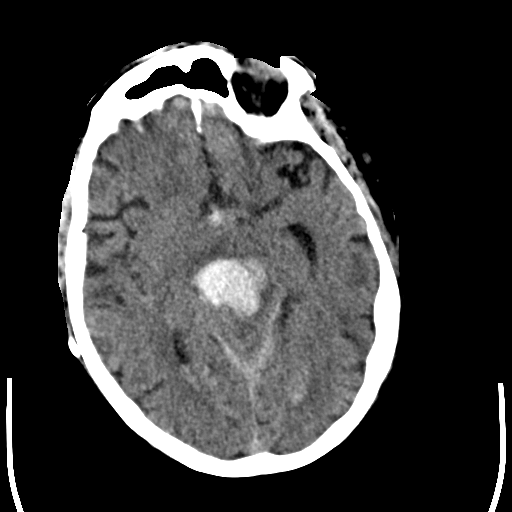
[im 16/32  brain]
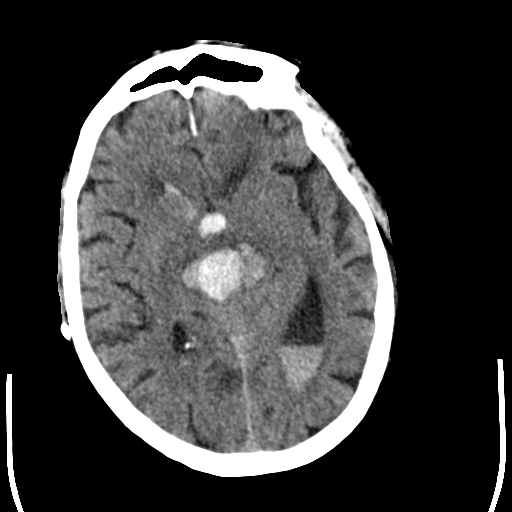
[im 18/32  brain]
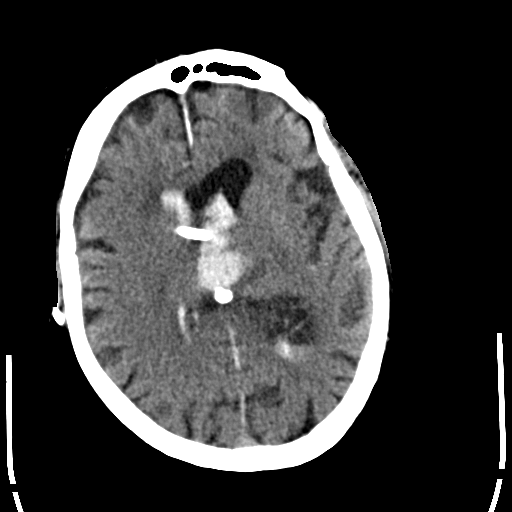
[im 20/32  brain]
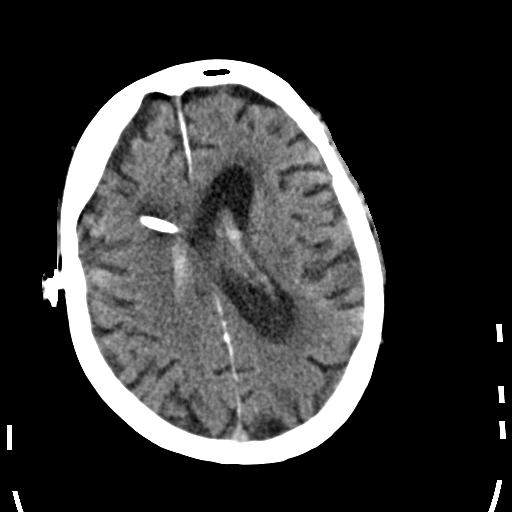
[im 20/32  bone]
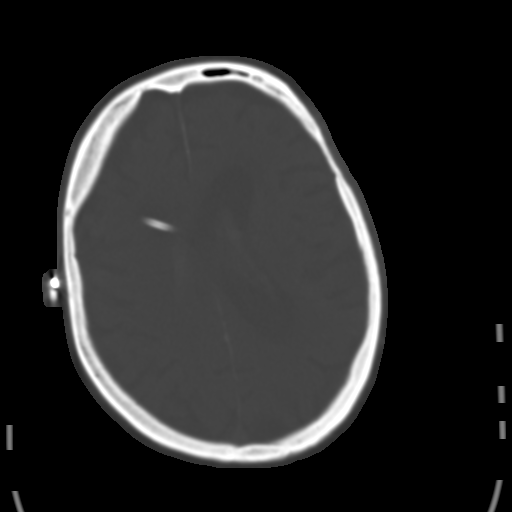
[im 23/32  brain]
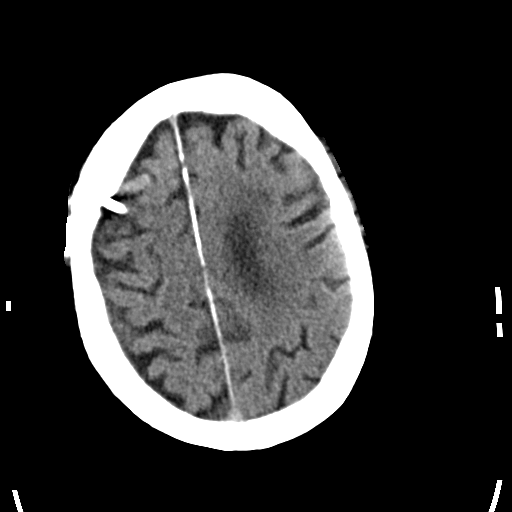
[im 25/32  brain]
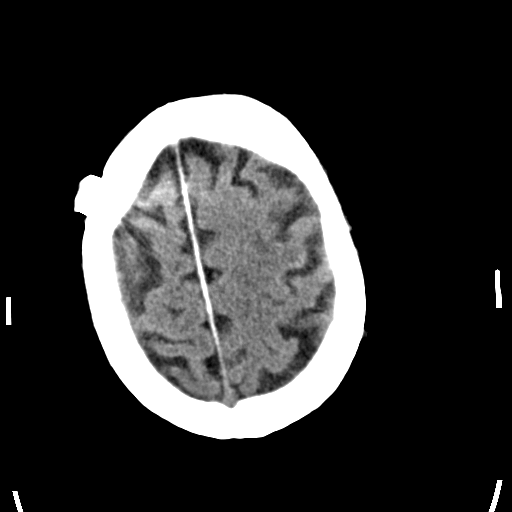
[im 27/32  brain]
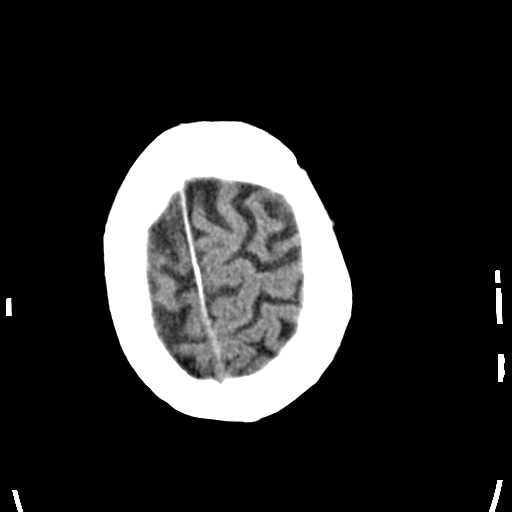
[im 29/32  brain]
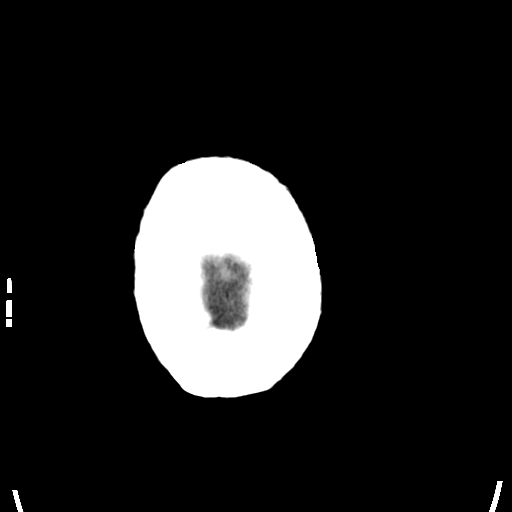
[im 29/32  bone]
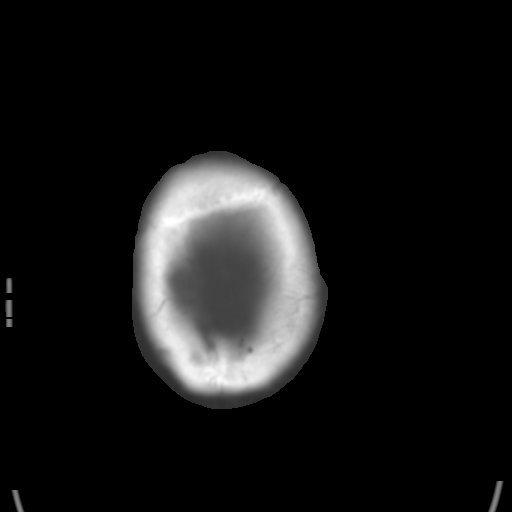

[Series 4: head wo · axial · 0.45mm/px · z∈[-143,-124]mm · 2 of 30 slices shown (2 of 2)]
[im 3/30  brain]
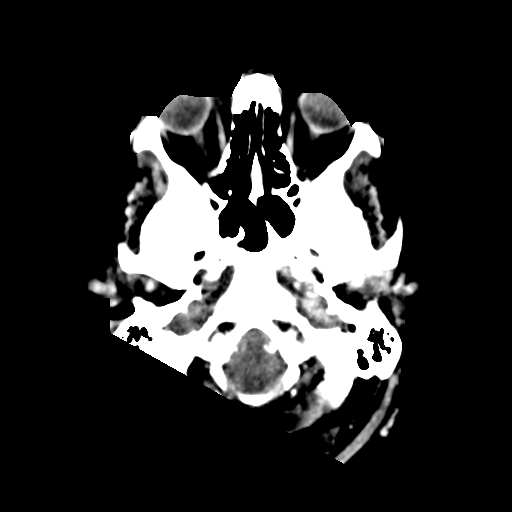
[im 7/30  brain]
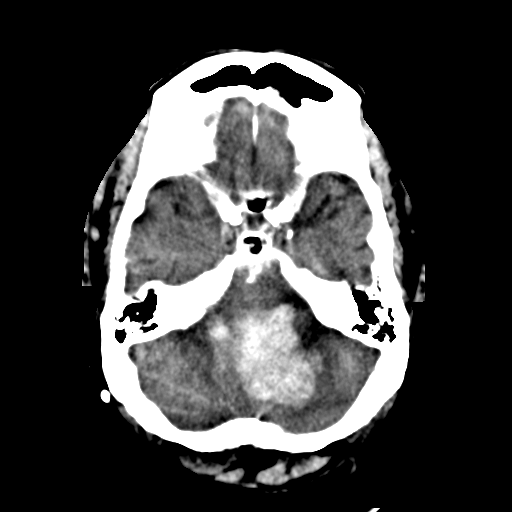

[15 of 30 positions shown; findings below may reference images not displayed]

FINDINGS: Sinuses/Soft tissues: Clear paranasal sinuses and mastoid air cells.

Intracranial: Right-sided VP shunt catheter terminates in the
anterior aspect left lateral ventricle. There is extensive
intraventricular hemorrhage within the lateral, third, and fourth
ventricles. Intraparenchymal hemorrhage is most likely centered at
the left side of the brainstem including at 5.1 x 4.7 cm on image
8/series 2.

No overt hydrocephalus.  Basal cisterns are compressed.

Mild low density in the periventricular white matter likely related
to small vessel disease. No complicating acute infarct.
IMPRESSION: 1. Large volume intraventricular hemorrhage, likely secondary to
hypertensive bleed. A parenchymal component is favored to be
centered within the left sided brainstem.
2. Significant mass effect with effacement of basal cisterns.
Critical test results telephoned to Pollyn, Jubrin.... at the time of
interpretation at [DATE] p.m....on . 11/26/2015...
